# Patient Record
Sex: Male | Born: 1945
Health system: Southern US, Community
[De-identification: ages and names within clinical notes are randomized; demographics above are authoritative.]

## PROBLEM LIST (undated history)

## (undated) DIAGNOSIS — J069 Acute upper respiratory infection, unspecified: Principal | ICD-10-CM

## (undated) DIAGNOSIS — Z87891 Personal history of nicotine dependence: Secondary | ICD-10-CM

## (undated) DIAGNOSIS — J40 Bronchitis, not specified as acute or chronic: Principal | ICD-10-CM

## (undated) DIAGNOSIS — H409 Unspecified glaucoma: Secondary | ICD-10-CM

## (undated) DIAGNOSIS — H9190 Unspecified hearing loss, unspecified ear: Secondary | ICD-10-CM

## (undated) DIAGNOSIS — E785 Hyperlipidemia, unspecified: Secondary | ICD-10-CM

## (undated) DIAGNOSIS — R011 Cardiac murmur, unspecified: Secondary | ICD-10-CM

## (undated) DIAGNOSIS — M1990 Unspecified osteoarthritis, unspecified site: Secondary | ICD-10-CM

## (undated) DIAGNOSIS — D1809 Hemangioma of other sites: Secondary | ICD-10-CM

## (undated) DIAGNOSIS — A6920 Lyme disease, unspecified: Secondary | ICD-10-CM

## (undated) HISTORY — DX: Unspecified osteoarthritis, unspecified site: M19.90

## (undated) HISTORY — PX: JOINT REPLACEMENT: SHX530

## (undated) HISTORY — PX: MASTOID DEBRIDEMENT: SHX2002

## (undated) HISTORY — PX: HERNIA REPAIR: SHX51

## (undated) HISTORY — PX: CHOLECYSTECTOMY: SHX55

## (undated) HISTORY — DX: Lyme disease, unspecified: A69.20

## (undated) HISTORY — DX: Hyperlipidemia, unspecified: E78.5

---

## 2000-05-10 ENCOUNTER — Encounter: Payer: Self-pay | Admitting: Emergency Medicine

## 2000-05-10 ENCOUNTER — Inpatient Hospital Stay (HOSPITAL_COMMUNITY): Admission: EM | Admit: 2000-05-10 | Discharge: 2000-05-13 | Payer: Self-pay | Admitting: Emergency Medicine

## 2000-05-11 ENCOUNTER — Encounter: Payer: Self-pay | Admitting: Surgery

## 2003-06-24 HISTORY — PX: TENDON REPAIR: SHX5111

## 2008-06-23 LAB — HM COLONOSCOPY

## 2010-08-22 HISTORY — PX: CARDIOVASCULAR STRESS TEST: SHX262

## 2010-08-22 HISTORY — PX: US ECHOCARDIOGRAPHY: HXRAD669

## 2010-10-03 ENCOUNTER — Ambulatory Visit
Admission: RE | Admit: 2010-10-03 | Discharge: 2010-10-03 | Disposition: A | Discharge status: Home / Self Care | Payer: PRIVATE HEALTH INSURANCE | Source: Ambulatory Visit | Attending: Family Medicine | Admitting: Family Medicine

## 2010-10-03 ENCOUNTER — Other Ambulatory Visit: Payer: Self-pay | Admitting: Family Medicine

## 2010-10-03 DIAGNOSIS — R52 Pain, unspecified: Secondary | ICD-10-CM

## 2010-10-03 DIAGNOSIS — R609 Edema, unspecified: Secondary | ICD-10-CM

## 2010-10-03 DIAGNOSIS — M1990 Unspecified osteoarthritis, unspecified site: Secondary | ICD-10-CM

## 2010-10-03 HISTORY — DX: Unspecified osteoarthritis, unspecified site: M19.90

## 2010-12-12 ENCOUNTER — Encounter: Payer: Self-pay | Admitting: Family Medicine

## 2010-12-24 ENCOUNTER — Encounter: Payer: Self-pay | Admitting: Family Medicine

## 2011-05-01 ENCOUNTER — Other Ambulatory Visit: Payer: Self-pay | Admitting: Family Medicine

## 2011-05-01 DIAGNOSIS — R2230 Localized swelling, mass and lump, unspecified upper limb: Secondary | ICD-10-CM

## 2011-05-01 DIAGNOSIS — M259 Joint disorder, unspecified: Secondary | ICD-10-CM

## 2011-05-05 ENCOUNTER — Ambulatory Visit
Admission: RE | Admit: 2011-05-05 | Discharge: 2011-05-05 | Disposition: A | Discharge status: Home / Self Care | Payer: Managed Care, Other (non HMO) | Source: Ambulatory Visit | Attending: Family Medicine | Admitting: Family Medicine

## 2011-05-05 DIAGNOSIS — M259 Joint disorder, unspecified: Secondary | ICD-10-CM

## 2011-05-05 DIAGNOSIS — R2230 Localized swelling, mass and lump, unspecified upper limb: Secondary | ICD-10-CM

## 2011-05-05 MED ORDER — IOHEXOL 300 MG/ML  SOLN
100.0000 mL | Freq: Once | INTRAMUSCULAR | Status: AC | PRN
  Administered 2011-05-05: 100 mL via INTRAVENOUS

## 2011-05-16 ENCOUNTER — Encounter (HOSPITAL_COMMUNITY): Payer: Self-pay

## 2011-05-20 ENCOUNTER — Encounter (HOSPITAL_COMMUNITY)
Admission: RE | Admit: 2011-05-20 | Discharge: 2011-05-20 | Disposition: A | Discharge status: Home / Self Care | Payer: Managed Care, Other (non HMO) | Source: Ambulatory Visit | Attending: Orthopedic Surgery | Admitting: Orthopedic Surgery

## 2011-05-20 ENCOUNTER — Encounter (HOSPITAL_COMMUNITY): Payer: Self-pay

## 2011-05-20 HISTORY — DX: Hemangioma of other sites: D18.09

## 2011-05-20 HISTORY — DX: Unspecified hearing loss, unspecified ear: H91.90

## 2011-05-20 HISTORY — DX: Cardiac murmur, unspecified: R01.1

## 2011-05-20 LAB — COMPREHENSIVE METABOLIC PANEL
ALT: 20 U/L (ref 0–53)
AST: 20 U/L (ref 0–37)
Albumin: 3.8 g/dL (ref 3.5–5.2)
CO2: 28 mEq/L (ref 19–32)
Chloride: 104 mEq/L (ref 96–112)
Creatinine, Ser: 0.96 mg/dL (ref 0.50–1.35)
GFR calc non Af Amer: 86 mL/min — ABNORMAL LOW (ref >90)
Sodium: 140 mEq/L (ref 135–145)
Total Bilirubin: 1.6 mg/dL — ABNORMAL HIGH (ref 0.3–1.2)

## 2011-05-20 LAB — URINALYSIS, ROUTINE W REFLEX MICROSCOPIC
Glucose, UA: NEGATIVE mg/dL
Ketones, ur: NEGATIVE mg/dL
Leukocytes, UA: NEGATIVE
Nitrite: NEGATIVE
Specific Gravity, Urine: 1.012 (ref 1.005–1.030)
pH: 6.5 (ref 5.0–8.0)

## 2011-05-20 LAB — PROTIME-INR: INR: 0.97 (ref 0.00–1.49)

## 2011-05-20 LAB — CBC
Platelets: 223 10*3/uL (ref 150–400)
RBC: 4.95 MIL/uL (ref 4.22–5.81)
RDW: 12.5 % (ref 11.5–15.5)
WBC: 6.7 10*3/uL (ref 4.0–10.5)

## 2011-05-20 LAB — TYPE AND SCREEN
ABO/RH(D): A POS
Antibody Screen: NEGATIVE

## 2011-05-20 LAB — SURGICAL PCR SCREEN: Staphylococcus aureus: NEGATIVE

## 2011-05-20 LAB — ABO/RH: ABO/RH(D): A POS

## 2011-05-20 LAB — APTT: aPTT: 29 seconds (ref 24–37)

## 2011-05-20 NOTE — Progress Notes (Signed)
Requested and received EKG, Stress test, ECHO from [facility].

## 2011-05-20 NOTE — Pre-Procedure Instructions (Signed)
20 RIPLEY BOGOSIAN  05/20/2011   Your procedure is scheduled on:  December 5  Report to [facility] Stay [facility] at 6:30 AM.  Call this number if you have problems the morning of surgery: [phone]   Remember:   Do not eat food:After Midnight.  May have clear liquids: up to 4 Hours before arrival.  Clear liquids include soda, tea, black coffee, apple or grape juice, broth.  Take these medicines the morning of surgery with A SIP OF WATER: none   Do not wear jewelry, make-up or nail polish.  Do not wear lotions, powders, or perfumes. You may wear deodorant.  Do not shave 48 hours prior to surgery.  Do not bring valuables to the hospital.  Contacts, dentures or bridgework may not be worn into surgery.  Leave suitcase in the car. After surgery it may be brought to your room.  For patients admitted to the hospital, checkout time is 11:00 AM the day of discharge.   Patients discharged the day of surgery will not be allowed to drive home.  Name and phone number of your driver: NA  Special Instructions: Incentive Spirometry - Practice and bring it with you on the day of surgery. and CHG Shower Use Special Wash: 1/2 bottle night before surgery and 1/2 bottle morning of surgery.   Please read over the following fact sheets that you were given: Pain Booklet, Coughing and Deep Breathing, Blood Transfusion Information, Total Joint Packet and Surgical Site Infection Prevention

## 2011-05-27 MED ORDER — CEFAZOLIN SODIUM-DEXTROSE 2-3 GM-% IV SOLR
2.0000 g | INTRAVENOUS | Status: AC
  Administered 2011-05-28: 2 g via INTRAVENOUS
  Filled 2011-05-27 (×2): qty 50

## 2011-05-27 NOTE — H&P (Addendum)
  [facility]el/[facility] [address]   [address] [place], [place] [address] [phone] A Division of [facility]  Loreta Ave, M.D.     Robert A. Thurston Hole, M.D.     Lunette Stands, M.D. Eulas Post, M.D.    Buford Dresser, M.D. Estell Harpin, M.D. Ralene Cork, D.O.          Genene Churn. Barry Dienes, PA-C            Kirstin A. Shepperson, PA-C Janace Litten, OPA-C   RE: Theodore Davis, Theodore Davis                                [phone]      DOB: 1946-01-04 PROGRESS NOTE: 05-20-11 Chief complaint: Right knee pain.  History of present illness: 34 three year-old white male with a history of end stage DJD, right knee, and chronic pain.  Returns.  States that knee symptoms are unchanged from previous visit.  He is wanting to proceed with total knee replacement as scheduled next week.   Current medications: Zocor, Magnesium and Boswellia extract. Allergies: Codeine. Past medical/surgical history: Cholecystectomy, familial lipomatosis disorder and hypercholesterolemia. Review of systems: All other systems are unremarkable.  Family history: Positive for heart disease, kidney disease and cancer.  Social history: Quit smoking several years ago, denies alcohol use.  Patient is married.      EXAMINATION: Height: 5?9.  Weight: 204 pounds.  Blood pressure: 139/88.  Pulse: 60.  Temperature: 97.8.  Pleasant white male, alert and oriented x 3 and in no acute distress.  Gait is antalgic.  Head is normocephalic, a traumatic.  PERRLA, EOMI.  Oral mucosa pink and moist.  Cervical spine unremarkable.  Lungs: CTA bilaterally.  No wheezes.  Heart: RRR.  S1 and S2.  Abdomen: Round and non-distended.  NBS x 4.  Soft and non-tender.  Right knee: Decreased range of motion.  Positive crepitus.  Joint line tender.  1-2+ effusion.  Ligaments stable.  Calf non-tender.  Neurovascularly intact.  Skin warm and dry.    IMPRESSION: End stage DJD, right knee, and chronic  pain.  Failed conservative treatment.  PLAN:  We will proceed with right total knee replacement as scheduled next week.  Surgical procedure, along with potential rehab/recovery time discussed with patient and his wife, who is present.  All questions answered.    Loreta Ave, M.D.   Electronically verified by Loreta Ave, M.D. DFM(JMO):jjh D 05-20-11 T 05-22-11  The patient has been re-examined, and the chart reviewed, and there have been no interval changes to the documented history and physical.    The risks, benefits, and alternatives have been discussed at length, and the patient is willing to proceed.

## 2011-05-28 ENCOUNTER — Encounter (HOSPITAL_COMMUNITY): Payer: Self-pay | Admitting: Anesthesiology

## 2011-05-28 ENCOUNTER — Inpatient Hospital Stay (HOSPITAL_COMMUNITY)
Admission: RE | Admit: 2011-05-28 | Discharge: 2011-05-30 | DRG: 470 | Disposition: A | Discharge status: Home / Self Care | Payer: Managed Care, Other (non HMO) | Source: Ambulatory Visit | Attending: Orthopedic Surgery | Admitting: Orthopedic Surgery

## 2011-05-28 ENCOUNTER — Inpatient Hospital Stay (HOSPITAL_COMMUNITY): Payer: Managed Care, Other (non HMO) | Admitting: Anesthesiology

## 2011-05-28 ENCOUNTER — Inpatient Hospital Stay (HOSPITAL_COMMUNITY): Payer: Managed Care, Other (non HMO)

## 2011-05-28 ENCOUNTER — Encounter (HOSPITAL_COMMUNITY): Payer: Self-pay | Admitting: *Deleted

## 2011-05-28 ENCOUNTER — Encounter (HOSPITAL_COMMUNITY)
Admission: RE | Disposition: A | Discharge status: Home / Self Care | Payer: Self-pay | Source: Ambulatory Visit | Attending: Orthopedic Surgery

## 2011-05-28 DIAGNOSIS — R011 Cardiac murmur, unspecified: Secondary | ICD-10-CM | POA: Diagnosis present

## 2011-05-28 DIAGNOSIS — E7800 Pure hypercholesterolemia, unspecified: Secondary | ICD-10-CM | POA: Diagnosis present

## 2011-05-28 DIAGNOSIS — E7889 Other lipoprotein metabolism disorders: Secondary | ICD-10-CM | POA: Diagnosis present

## 2011-05-28 DIAGNOSIS — G8929 Other chronic pain: Secondary | ICD-10-CM | POA: Diagnosis present

## 2011-05-28 DIAGNOSIS — Z4789 Encounter for other orthopedic aftercare: Secondary | ICD-10-CM

## 2011-05-28 DIAGNOSIS — M1710 Unilateral primary osteoarthritis, unspecified knee: Principal | ICD-10-CM | POA: Diagnosis present

## 2011-05-28 HISTORY — PX: TOTAL KNEE ARTHROPLASTY: SHX125

## 2011-05-28 SURGERY — ARTHROPLASTY, KNEE, TOTAL
Anesthesia: Regional | Site: Knee | Laterality: Right | Wound class: Clean

## 2011-05-28 MED ORDER — BUPIVACAINE-EPINEPHRINE PF 0.5-1:200000 % IJ SOLN
INTRAMUSCULAR | Status: DC | PRN
  Administered 2011-05-28: 30 mL

## 2011-05-28 MED ORDER — ONDANSETRON HCL 4 MG/2ML IJ SOLN: 4.0000 mg | Freq: Four times a day (QID) | INTRAMUSCULAR | Status: DC | PRN

## 2011-05-28 MED ORDER — MORPHINE SULFATE (PF) 1 MG/ML IV SOLN
INTRAVENOUS | Status: DC
  Administered 2011-05-28: 15:00:00 via INTRAVENOUS
  Administered 2011-05-29: 5 mg via INTRAVENOUS
  Administered 2011-05-29: 7 mg via INTRAVENOUS
  Administered 2011-05-29: 4 mg via INTRAVENOUS
  Filled 2011-05-28: qty 25

## 2011-05-28 MED ORDER — DIPHENHYDRAMINE HCL 12.5 MG/5ML PO ELIX
12.5000 mg | ORAL_SOLUTION | Freq: Four times a day (QID) | ORAL | Status: DC | PRN
  Filled 2011-05-28: qty 5

## 2011-05-28 MED ORDER — HYDROMORPHONE HCL PF 1 MG/ML IJ SOLN: 0.2500 mg | INTRAMUSCULAR | Status: DC | PRN

## 2011-05-28 MED ORDER — LACTATED RINGERS IV SOLN
INTRAVENOUS | Status: DC | PRN
  Administered 2011-05-28 (×2): via INTRAVENOUS

## 2011-05-28 MED ORDER — FENTANYL CITRATE 0.05 MG/ML IJ SOLN
INTRAMUSCULAR | Status: DC | PRN
  Administered 2011-05-28 (×2): 25 ug via INTRAVENOUS
  Administered 2011-05-28: 100 ug via INTRAVENOUS

## 2011-05-28 MED ORDER — SODIUM CHLORIDE 0.9 % IR SOLN
Status: DC | PRN
  Administered 2011-05-28: 1000 mL

## 2011-05-28 MED ORDER — ONDANSETRON HCL 4 MG/2ML IJ SOLN
INTRAMUSCULAR | Status: DC | PRN
  Administered 2011-05-28: 4 mg via INTRAVENOUS

## 2011-05-28 MED ORDER — WARFARIN VIDEO
Freq: Once | Status: AC
  Administered 2011-05-28: 14:00:00

## 2011-05-28 MED ORDER — SIMVASTATIN 20 MG PO TABS
20.0000 mg | ORAL_TABLET | Freq: Every day | ORAL | Status: DC
  Administered 2011-05-28 – 2011-05-29 (×2): 20 mg via ORAL
  Filled 2011-05-28 (×3): qty 1

## 2011-05-28 MED ORDER — METHOCARBAMOL 100 MG/ML IJ SOLN: 500.0000 mg | Freq: Four times a day (QID) | INTRAVENOUS | Status: DC | PRN

## 2011-05-28 MED ORDER — MIDAZOLAM HCL 5 MG/5ML IJ SOLN
INTRAMUSCULAR | Status: DC | PRN
  Administered 2011-05-28: 2 mg via INTRAVENOUS

## 2011-05-28 MED ORDER — ENOXAPARIN SODIUM 30 MG/0.3ML ~~LOC~~ SOLN
30.0000 mg | Freq: Two times a day (BID) | SUBCUTANEOUS | Status: DC
  Administered 2011-05-28 – 2011-05-30 (×4): 30 mg via SUBCUTANEOUS
  Filled 2011-05-28 (×5): qty 0.3

## 2011-05-28 MED ORDER — ACETAMINOPHEN 650 MG RE SUPP: 650.0000 mg | Freq: Four times a day (QID) | RECTAL | Status: DC | PRN

## 2011-05-28 MED ORDER — PHENOL 1.4 % MT LIQD: 1.0000 | OROMUCOSAL | Status: DC | PRN

## 2011-05-28 MED ORDER — METHOCARBAMOL 500 MG PO TABS
500.0000 mg | ORAL_TABLET | Freq: Four times a day (QID) | ORAL | Status: DC | PRN
  Administered 2011-05-29 – 2011-05-30 (×2): 500 mg via ORAL
  Filled 2011-05-28 (×2): qty 1

## 2011-05-28 MED ORDER — OXYCODONE-ACETAMINOPHEN 5-325 MG PO TABS
1.0000 | ORAL_TABLET | ORAL | Status: DC | PRN
  Administered 2011-05-29 (×2): 1 via ORAL
  Administered 2011-05-30 (×3): 2 via ORAL
  Filled 2011-05-28 (×2): qty 1
  Filled 2011-05-28 (×3): qty 2

## 2011-05-28 MED ORDER — ONDANSETRON HCL 4 MG PO TABS: 4.0000 mg | ORAL_TABLET | Freq: Four times a day (QID) | ORAL | Status: DC | PRN

## 2011-05-28 MED ORDER — METHOCARBAMOL 100 MG/ML IJ SOLN
500.0000 mg | Freq: Once | INTRAVENOUS | Status: DC
  Filled 2011-05-28: qty 5

## 2011-05-28 MED ORDER — COUMADIN BOOK
Freq: Once | Status: AC
  Administered 2011-05-28: 14:00:00
  Filled 2011-05-28: qty 1

## 2011-05-28 MED ORDER — DIPHENHYDRAMINE HCL 50 MG/ML IJ SOLN: 12.5000 mg | Freq: Four times a day (QID) | INTRAMUSCULAR | Status: DC | PRN

## 2011-05-28 MED ORDER — WARFARIN SODIUM 7.5 MG PO TABS
7.5000 mg | ORAL_TABLET | Freq: Once | ORAL | Status: AC
  Administered 2011-05-28: 7.5 mg via ORAL
  Filled 2011-05-28: qty 1

## 2011-05-28 MED ORDER — NALOXONE HCL 0.4 MG/ML IJ SOLN: 0.4000 mg | INTRAMUSCULAR | Status: DC | PRN

## 2011-05-28 MED ORDER — ACETAMINOPHEN 325 MG PO TABS: 650.0000 mg | ORAL_TABLET | Freq: Four times a day (QID) | ORAL | Status: DC | PRN

## 2011-05-28 MED ORDER — CEFAZOLIN SODIUM 1-5 GM-% IV SOLN
1.0000 g | Freq: Four times a day (QID) | INTRAVENOUS | Status: AC
  Administered 2011-05-28 – 2011-05-29 (×3): 1 g via INTRAVENOUS
  Filled 2011-05-28 (×3): qty 50

## 2011-05-28 MED ORDER — MORPHINE SULFATE 4 MG/ML IJ SOLN
INTRAMUSCULAR | Status: DC | PRN
  Administered 2011-05-28: 4 mg via INTRAVENOUS

## 2011-05-28 MED ORDER — POTASSIUM CHLORIDE IN NACL 20-0.45 MEQ/L-% IV SOLN
INTRAVENOUS | Status: DC
  Administered 2011-05-28 – 2011-05-29 (×3): via INTRAVENOUS
  Filled 2011-05-28 (×7): qty 1000

## 2011-05-28 MED ORDER — MENTHOL 3 MG MT LOZG: 1.0000 | LOZENGE | OROMUCOSAL | Status: DC | PRN

## 2011-05-28 MED ORDER — PROPOFOL 10 MG/ML IV EMUL
INTRAVENOUS | Status: DC | PRN
  Administered 2011-05-28: 200 mg via INTRAVENOUS

## 2011-05-28 MED ORDER — CHLORHEXIDINE GLUCONATE 4 % EX LIQD: 60.0000 mL | Freq: Once | CUTANEOUS | Status: DC

## 2011-05-28 MED ORDER — SODIUM CHLORIDE 0.9 % IJ SOLN: 9.0000 mL | INTRAMUSCULAR | Status: DC | PRN

## 2011-05-28 MED ORDER — BUPIVACAINE HCL (PF) 0.25 % IJ SOLN
INTRAMUSCULAR | Status: DC | PRN
  Administered 2011-05-28: 30 mL

## 2011-05-28 SURGICAL SUPPLY — 55 items
BANDAGE ESMARK 6X9 LF (GAUZE/BANDAGES/DRESSINGS) ×1 IMPLANT
BLADE SAG 18X100X1.27 (BLADE) ×4 IMPLANT
BNDG CMPR 9X6 STRL LF SNTH (GAUZE/BANDAGES/DRESSINGS) ×1
BNDG ESMARK 6X9 LF (GAUZE/BANDAGES/DRESSINGS) ×2
BOOTCOVER CLEANROOM LRG (PROTECTIVE WEAR) ×4 IMPLANT
BOWL SMART MIX CTS (DISPOSABLE) ×2 IMPLANT
CEMENT BONE SIMPLEX SPEEDSET (Cement) ×3 IMPLANT
CLOTH BEACON ORANGE TIMEOUT ST (SAFETY) ×2 IMPLANT
COVER BACK TABLE 24X17X13 BIG (DRAPES) ×2 IMPLANT
COVER SURGICAL LIGHT HANDLE (MISCELLANEOUS) ×2 IMPLANT
CUFF TOURNIQUET SINGLE 34IN LL (TOURNIQUET CUFF) ×2 IMPLANT
DRAPE EXTREMITY T 121X128X90 (DRAPE) ×2 IMPLANT
DRAPE PROXIMA HALF (DRAPES) ×2 IMPLANT
DRAPE U-SHAPE 47X51 STRL (DRAPES) ×2 IMPLANT
DRSG PAD ABDOMINAL 8X10 ST (GAUZE/BANDAGES/DRESSINGS) ×2 IMPLANT
DURAPREP 26ML APPLICATOR (WOUND CARE) ×2 IMPLANT
ELECT CAUTERY BLADE 6.4 (BLADE) ×2 IMPLANT
ELECT REM PT RETURN 9FT ADLT (ELECTROSURGICAL) ×2
ELECTRODE REM PT RTRN 9FT ADLT (ELECTROSURGICAL) ×1 IMPLANT
EVACUATOR 1/8 PVC DRAIN (DRAIN) ×2 IMPLANT
FACESHIELD LNG OPTICON STERILE (SAFETY) ×2 IMPLANT
GAUZE XEROFORM 5X9 LF (GAUZE/BANDAGES/DRESSINGS) ×2 IMPLANT
GLOVE BIOGEL PI IND STRL 8 (GLOVE) ×1 IMPLANT
GLOVE BIOGEL PI INDICATOR 8 (GLOVE) ×1
GOWN PREVENTION PLUS XLARGE (GOWN DISPOSABLE) ×4 IMPLANT
GOWN STRL NON-REIN LRG LVL3 (GOWN DISPOSABLE) ×3 IMPLANT
GOWN STRL REIN 2XL LVL4 (GOWN DISPOSABLE) ×2 IMPLANT
HANDPIECE INTERPULSE COAX TIP (DISPOSABLE) ×2
IMMOBILIZER KNEE 22 UNIV (SOFTGOODS) ×2 IMPLANT
IMMOBILIZER KNEE 24 THIGH 36 (MISCELLANEOUS) IMPLANT
IMMOBILIZER KNEE 24 UNIV (MISCELLANEOUS)
KIT BASIN OR (CUSTOM PROCEDURE TRAY) ×2 IMPLANT
KIT ROOM TURNOVER OR (KITS) ×2 IMPLANT
MANIFOLD NEPTUNE II (INSTRUMENTS) ×2 IMPLANT
NS IRRIG 1000ML POUR BTL (IV SOLUTION) ×2 IMPLANT
PACK TOTAL JOINT (CUSTOM PROCEDURE TRAY) ×2 IMPLANT
PAD ARMBOARD 7.5X6 YLW CONV (MISCELLANEOUS) ×4 IMPLANT
PAD CAST 4YDX4 CTTN HI CHSV (CAST SUPPLIES) ×1 IMPLANT
PADDING CAST COTTON 4X4 STRL (CAST SUPPLIES) ×2
PADDING CAST COTTON 6X4 STRL (CAST SUPPLIES) ×2 IMPLANT
RUBBERBAND STERILE (MISCELLANEOUS) ×2 IMPLANT
SET HNDPC FAN SPRY TIP SCT (DISPOSABLE) ×1 IMPLANT
SPONGE GAUZE 4X4 12PLY (GAUZE/BANDAGES/DRESSINGS) ×2 IMPLANT
STAPLER VISISTAT 35W (STAPLE) ×2 IMPLANT
SUCTION FRAZIER TIP 10 FR DISP (SUCTIONS) ×2 IMPLANT
SUT VIC AB 1 CTX 36 (SUTURE) ×4
SUT VIC AB 1 CTX36XBRD ANBCTR (SUTURE) ×2 IMPLANT
SUT VIC AB 2-0 CT1 27 (SUTURE) ×4
SUT VIC AB 2-0 CT1 TAPERPNT 27 (SUTURE) ×2 IMPLANT
SYR 30ML LL (SYRINGE) ×2 IMPLANT
SYR 30ML SLIP (SYRINGE) ×2 IMPLANT
TOWEL OR 17X24 6PK STRL BLUE (TOWEL DISPOSABLE) ×2 IMPLANT
TOWEL OR 17X26 10 PK STRL BLUE (TOWEL DISPOSABLE) ×2 IMPLANT
TRAY FOLEY CATH 14FR (SET/KITS/TRAYS/PACK) ×2 IMPLANT
WATER STERILE IRR 1000ML POUR (IV SOLUTION) ×6 IMPLANT

## 2011-05-28 NOTE — Transfer of Care (Signed)
Immediate Anesthesia Transfer of Care Note  Patient: Theodore Davis  Procedure(s) Performed:  TOTAL KNEE ARTHROPLASTY - Right total knee arthroplasty   Patient Location: PACU  Anesthesia Type: General  Level of Consciousness: sedated and patient cooperative  Airway & Oxygen Therapy: Patient Spontanous Breathing and Patient connected to nasal cannula oxygen  Post-op Assessment: Report given to PACU RN and Post -op Vital signs reviewed and stable  Post vital signs: Reviewed and stable  Complications: No apparent anesthesia complications

## 2011-05-28 NOTE — Brief Op Note (Signed)
05/28/2011  10:30 AM  PATIENT:  Theodore Davis  65 y.o. male  PRE-OPERATIVE DIAGNOSIS:  Degenerative joint disease of right knee   POST-OPERATIVE DIAGNOSIS:  Degenerative joint disease of right knee   PROCEDURE:  Procedure(s): Right TOTAL KNEE ARTHROPLASTY  SURGEON:  Surgeon(s): Loreta Ave, MD  PHYSICIAN ASSISTANT: Anielle Headrick M   ASSISTANTS: none   ANESTHESIA: general  EBL:  Total I/O In: 1250 [I.V.:1250] Out: 230 [Urine:200; Blood:30]    DRAINS: hemovac    SPECIMEN:  No Specimen  DISPOSITION OF SPECIMEN:  N/A  COUNTS:  YES  TOURNIQUET:   Total Tourniquet Time Documented: Thigh (Right) - 74 minutes     PATIENT DISPOSITION:  PACU - hemodynamically stable.   Delay start of Pharmacological VTE agent (>24hrs) due to surgical blood loss or risk of bleeding:  {YES/NO/NOT APPLICABLE:20182

## 2011-05-28 NOTE — Progress Notes (Signed)
ANTICOAGULATION CONSULT NOTE - Initial Consult  Pharmacy Consult for Coumadin Indication: VTE prophylaxis  Allergies  Allergen Reactions  . Codeine Other (See Comments)    Unknown.  . Lipitor (Atorvastatin Calcium) Other (See Comments)    myalgias  . Sudafed (Pseudoephedrine Hcl) Other (See Comments)    unknown    Patient Measurements:   Adjusted Body Weight:    Vital Signs: Temp: 97.6 F (36.4 C) (12/05 1345) Temp src: Oral (12/05 0634) BP: 102/64 mmHg (12/05 1345) Pulse Rate: 61  (12/05 1345)  Labs: No results found for this basename: HGB:2,HCT:3,PLT:3,APTT:3,LABPROT:3,INR:3,HEPARINUNFRC:3,CREATININE:3,CKTOTAL:3,CKMB:3,TROPONINI:3 in the last 72 hours CrCl is unknown because there is no height on file for the current visit.  Medical History: Past Medical History  Diagnosis Date  . Lyme disease   . Arthritis 10/03/2010    right sternoclavicular joint  . Hyperlipidemia     hypercholesterolemia  . Heart murmur   . HOH (hard of hearing)     R ear  . Hemangioma of spine     lower lumbar    Medications:  Prescriptions prior to admission  Medication Sig Dispense Refill  . simvastatin (ZOCOR) 20 MG tablet Take 20 mg by mouth at bedtime.        Marland Kitchen DISCONTD: Boswellia Serrata (BOSWELLIA PO) Take 1 tablet by mouth 2 (two) times daily with a meal.        . DISCONTD: fish oil-omega-3 fatty acids 1000 MG capsule Take 1 g by mouth daily.       Marland Kitchen DISCONTD: MAGNESIUM PO Take 1 tablet by mouth 2 (two) times daily with a meal.          Assessment: S/p R TKA to start LMWH/Coumadin for VTE prophylaxis. Baseline INR 0.97 with Tbili elevated at 1.6. Baseline labs done 11/27.  Goal of Therapy:  INR 2-3   Plan:  Lovenox 30mg /12h Coumadin 7.5mg  x 1 today. Initiate education with Coumadin book and video.  Merilynn [place], [employer] 05/28/2011,1:55 PM

## 2011-05-28 NOTE — Anesthesia Postprocedure Evaluation (Signed)
Anesthesia Post Note  Patient: Theodore Davis  Procedure(s) Performed:  TOTAL KNEE ARTHROPLASTY - Right total knee arthroplasty   Anesthesia type: General  Patient location: PACU  Post pain: Pain level controlled and Adequate analgesia  Post assessment: Post-op Vital signs reviewed, Patient's Cardiovascular Status Stable, Respiratory Function Stable, Patent Airway and Pain level controlled  Last Vitals:  Filed Vitals:   05/28/11 1030  BP:   Pulse:   Temp: 36.6 C  Resp:     Post vital signs: Reviewed and stable  Level of consciousness: awake, alert  and oriented  Complications: No apparent anesthesia complications

## 2011-05-28 NOTE — Anesthesia Procedure Notes (Addendum)
Anesthesia Regional Block:  Femoral nerve block  Pre-Anesthetic Checklist: ,, timeout performed, Correct Patient, Correct Site, Correct Laterality, Correct Procedure,, site marked, risks and benefits discussed, Surgical consent,  Pre-op evaluation,  At surgeon's request and post-op pain management  Laterality: Right  Prep: chloraprep       Needles:  Injection technique: Single-shot  Needle Type: Echogenic Stimulator Needle     Needle Length: 9cm  Needle Gauge: 21    Additional Needles:  Procedures: nerve stimulator Femoral nerve block  Nerve Stimulator or Paresthesia:  Response: Quadriceps muscle contraction, 0.45 mA,   Additional Responses:   Narrative:  Start time: 05/28/2011 8:20 AM End time: 05/28/2011 8:30 AM Injection made incrementally with aspirations every 5 mL.  Performed by: Personally  Anesthesiologist: Dr Chaney Malling  Additional Notes: Functioning IV was confirmed and monitors were applied.  A 90mm 21ga Arrow echogenic stimulator needle was used. Sterile prep and drape,hand hygiene and sterile gloves were used.  Negative aspiration and negative test dose prior to incremental administration of local anesthetic. The patient tolerated the procedure well.    Femoral nerve block Procedure Name: LMA Insertion Date/Time: 05/28/2011 8:50 AM Performed by: Gwenyth Allegra Pre-anesthesia Checklist: Patient identified, Emergency Drugs available, Timeout performed, Suction available and Patient being monitored Patient Re-evaluated:Patient Re-evaluated prior to inductionOxygen Delivery Method: Circle System Utilized Preoxygenation: Pre-oxygenation with 100% oxygen Intubation Type: IV induction LMA: LMA inserted LMA Size: 5.0 Number of attempts: 1 Placement Confirmation: positive ETCO2,  CO2 detector and breath sounds checked- equal and bilateral Tube secured with: Tape Dental Injury: Teeth and Oropharynx as per pre-operative assessment        Narrative:         Narrative:       Narrative:

## 2011-05-28 NOTE — Plan of Care (Signed)
Problem: Consults Goal: Diagnosis- Total Joint Replacement Primary Total Knee     

## 2011-05-28 NOTE — Anesthesia Preprocedure Evaluation (Addendum)
Anesthesia Evaluation  Patient identified by MRN, date of birth, ID band Patient awake    Reviewed: Allergy & Precautions, H&P , NPO status , Patient's Chart, lab work & pertinent test results  Airway Mallampati: II  Neck ROM: full    Dental   Pulmonary          Cardiovascular + Valvular Problems/Murmurs     Neuro/Psych    GI/Hepatic   Endo/Other    Renal/GU      Musculoskeletal   Abdominal   Peds  Hematology   Anesthesia Other Findings   Reproductive/Obstetrics                           Anesthesia Physical Anesthesia Plan  ASA: II  Anesthesia Plan: General and Regional   Post-op Pain Management:    Induction: Intravenous  Airway Management Planned: LMA  Additional Equipment:   Intra-op Plan:   Post-operative Plan: Extubation in OR  Informed Consent:   Plan Discussed with: CRNA and Surgeon  Anesthesia Plan Comments:         Anesthesia Quick Evaluation

## 2011-05-28 NOTE — Preoperative (Signed)
Beta Blockers   Reason not to administer Beta Blockers:Not Applicable 

## 2011-05-29 ENCOUNTER — Encounter (HOSPITAL_COMMUNITY): Payer: Self-pay | Admitting: Orthopedic Surgery

## 2011-05-29 LAB — BASIC METABOLIC PANEL
CO2: 28 mEq/L (ref 19–32)
Calcium: 7.9 mg/dL — ABNORMAL LOW (ref 8.4–10.5)
Creatinine, Ser: 0.97 mg/dL (ref 0.50–1.35)
Glucose, Bld: 120 mg/dL — ABNORMAL HIGH (ref 70–99)

## 2011-05-29 LAB — CBC
HCT: 35.2 % — ABNORMAL LOW (ref 39.0–52.0)
Hemoglobin: 12.1 g/dL — ABNORMAL LOW (ref 13.0–17.0)
MCH: 30 pg (ref 26.0–34.0)
MCV: 87.1 fL (ref 78.0–100.0)
RBC: 4.04 MIL/uL — ABNORMAL LOW (ref 4.22–5.81)

## 2011-05-29 MED ORDER — WARFARIN SODIUM 7.5 MG PO TABS
7.5000 mg | ORAL_TABLET | Freq: Once | ORAL | Status: AC
  Administered 2011-05-29: 7.5 mg via ORAL
  Filled 2011-05-29: qty 1

## 2011-05-29 NOTE — Progress Notes (Signed)
ANTICOAGULATION CONSULT NOTE - Follow Up Consult  Pharmacy Consult for Coumadin Indication: VTE prophylaxis  Allergies  Allergen Reactions  . Codeine Other (See Comments)    Unknown.  . Lipitor (Atorvastatin Calcium) Other (See Comments)    myalgias  . Sudafed (Pseudoephedrine Hcl) Other (See Comments)    unknown      Vital Signs: Temp: 99.3 F (37.4 C) (12/06 0607) BP: 109/58 mmHg (12/06 0607) Pulse Rate: 77  (12/06 0607)  Labs:  Basename 05/29/11 0640  HGB 12.1*  HCT 35.2*  PLT 175  APTT --  LABPROT 16.0*  INR 1.25  HEPARINUNFRC --  CREATININE 0.97  CKTOTAL --  CKMB --  TROPONINI --   CrCl is unknown because there is no height on file for the current visit.   Medications:  Scheduled:    . ceFAZolin (ANCEF) IV  1 g Intravenous Q6H  . coumadin book   Does not apply Once  . enoxaparin  30 mg Subcutaneous Q12H  . methocarbamol(ROBAXIN) IV  500 mg Intravenous Once  . simvastatin  20 mg Oral QHS  . warfarin  7.5 mg Oral ONCE-1800  . warfarin   Does not apply Once  . DISCONTD: chlorhexidine  60 mL Topical Once  . DISCONTD: morphine   Intravenous Q4H    Assessment: 65 y/o male patient s/p TKR receiving coumadin for DVT px. INR subtherapeutic, on lovenox 30mg  q12h. No bleeding reported.  Goal of Therapy:  INR 2-3   Plan:  Repeat coumadin 7.5mg  today and f/u in am.  Verlene Mayer, PharmD, BCPS 12/6/201211:32 AM

## 2011-05-29 NOTE — Plan of Care (Signed)
Problem: Discharge Progression Outcomes Goal: Activity appropriate for discharge plan Outcome: Progressing Pt tolerated UB/LB dressing, sink level grooming , transfer to restroom and then became light headed returning to chair. Once in chair light headed feeling resolved.

## 2011-05-29 NOTE — Op Note (Signed)
NAMEMarland Kitchen  VICKEY, EWBANK NO.:  [id number]  MEDICAL RECORD NO.:  [id number]  LOCATION:  5018                         FACILITY:  [facility]  PHYSICIAN:  Loreta Ave, M.D. DATE OF BIRTH:  [birth year]  DATE OF PROCEDURE:  05/28/2011 DATE OF DISCHARGE:                              OPERATIVE REPORT   PREOPERATIVE DIAGNOSIS:  Right knee end-stage degenerative arthritis. Varus alignment.  POSTOPERATIVE DIAGNOSIS:  Right knee end-stage degenerative arthritis. Varus alignment.  PROCEDURE:  Modified minimally invasive right total knee replacement, Stryker Triathlon prosthesis.  Cemented peg posterior stabilized #6 femoral component.  Cemented # 6 tibial component, 9-mm polyethylene insert.  Cemented resurfacing 38-mm patellar component.  Soft tissue balancing.  SURGEON:  Loreta Ave, MD  ASSISTANT:  Genene Churn. Barry Dienes, [place], present throughout the entire case, necessary for timely completion of procedure.  ANESTHESIA:  General.  BLOOD LOSS:  Minimal.  SPECIMENS:  None.  CULTURES:  None.  COMPLICATIONS:  None.  DRESSING:  Soft compressive knee immobilizer.  DRAINS:  Hemovac x1.  TOURNIQUET TIME:  1 hour and 10 minutes.  PROCEDURE:  The patient was brought to the operating room and placed on the operating table in supine position.  After adequate anesthesia had been obtained, right knee examined.  Varus alignment, fairly good extension and flexion.  Stable ligaments.  Varus partially correctable. Tourniquet applied, prepped and draped in usual sterile fashion. Exsanguinated with elevation of Esmarch.  Tourniquet inflated to 350 mmHg.  Straight incision above the patella down to the tibial tubercle. Skin and subcutaneous tissue divided.  Hemostasis with cautery.  Medial arthrotomy, vastus splitting, preserving quad tendon.  Knee exposed. Remnants of menisci, cruciate ligaments, loose bodies, periarticular spurs removed.  Distal femur exposed.  Intramedullary  guide placed.  A 10-mm resection set at 5 degrees of valgus.  Using epicondylar axis, sized, cut, and fitted for a pegged cemented posterior stabilized #6 component.  Attention turned to the tibia.  A 3-degree posterior slope cut.  Size #6 component as well.  Debris cleared throughout all recesses.  Patella exposed, posterior 10 mm removed.  Drilled, sized, and fitted for a 38-mm component.  Medial capsule release had already been done.  Trials put in place.  Tibia was marked for rotation with trials.  With the 9-mm insert and the #6 components above and below, full extension, full flexion, good alignment, good stability, good mechanical axis.  Excellent patellofemoral tracking as well.  All trials removed.  Copious irrigation with a pulse irrigating device.  Cement prepared, placed on all components, firmly cemented.  Patellar component held with clamp.  After the cement hardened, knee was reexamined with the same findings as above.  Hemovac placed and brought out through a separate stab wound.  Arthrotomy closed with #1 Vicryl.  Skin and subcutaneous tissue with Vicryl and staples.  Sterile compressive dressing applied.  Tourniquet deflated and removed.  Knee immobilizer applied.  Anesthesia reversed.  Brought to recovery room.  Tolerated the surgery well.  No complications.     Loreta Ave, M.D.     DFM/MEDQ  D:  05/28/2011  T:  05/29/2011  Job:  [id number]

## 2011-05-29 NOTE — Progress Notes (Signed)
Occupational Therapy Evaluation Patient Details Name: Theodore Davis MRN: [id number] DOB: [birth year] Today's Date: 05/29/2011  Problem List: There is no problem list on file for this patient.   Past Medical History:  Past Medical History  Diagnosis Date  . Lyme disease   . Arthritis 10/03/2010    right sternoclavicular joint  . Hyperlipidemia     hypercholesterolemia  . Heart murmur   . HOH (hard of hearing)     R ear  . Hemangioma of spine     lower lumbar   Past Surgical History:  Past Surgical History  Procedure Date  . Cholecystectomy   . US echocardiography 08/2010  . Cardiovascular stress test 08/2010  . Tendon repair 2005    R elbow  . Mastoid debridement     R ear  . Hernia repair     R inguinal  . Total knee arthroplasty 05/28/2011    Procedure: TOTAL KNEE ARTHROPLASTY;  Surgeon: Loreta Ave, MD;  Location: [facility] OR;  Service: Orthopedics;  Laterality: Right;  Right total knee arthroplasty     OT Assessment/Plan/Recommendation OT Assessment Clinical Impression Statement: 65 yo male s/p Rt TKA with decr mobility, decr activity tolerance that could benefit from acute OT to maximize independence to decr burden of care at d/c home OT Recommendation/Assessment: Patient will need skilled OT in the acute care venue OT Problem List: Decreased activity tolerance;Impaired balance (sitting and/or standing);Decreased safety awareness;Decreased knowledge of use of DME or AE;Pain OT Therapy Diagnosis : Generalized weakness;Acute pain OT Plan OT Frequency: Min 2X/week OT Recommendation Follow Up Recommendations: Home health OT Equipment Recommended: Defer to next venue Individuals Consulted Consulted and Agree with Results and Recommendations: Patient OT Goals Acute Rehab OT Goals OT Goal Formulation: With patient Time For Goal Achievement: 2 weeks ADL Goals Pt Will Transfer to Toilet: with modified independence;Regular height toilet;Ambulation Pt Will Perform  Toileting - Clothing Manipulation: with modified independence;Sitting on 3-in-1 or toilet Pt Will Perform Toileting - Hygiene: with modified independence;Sit to stand from 3-in-1/toilet Pt Will Perform Tub/Shower Transfer: with supervision;Ambulation Additional ADL Goal #1: pt will perform bed mobility hob flat no rails MOD I as precursor to ADLS.  OT Evaluation Precautions/Restrictions  Precautions Precautions: Knee Required Braces or Orthoses: Yes Knee Immobilizer: On at all times;On except when in CPM Restrictions Weight Bearing Restrictions: Yes RLE Weight Bearing: Weight bearing as tolerated Prior Functioning Home Living Lives With: Spouse Receives Help From: Family Type of Home: House Home Layout: Two level Alternate Level Stairs-Rails: Right Alternate Level Stairs-Number of Steps: 13 Home Access: Stairs to enter Entrance Stairs-Rails: None Entrance Stairs-Number of Steps: 1 Bathroom Shower/Tub: Engineer, manufacturing systems: Standard Bathroom Accessibility: Yes How Accessible: Accessible via walker Home Adaptive Equipment: Walker - rolling;Bedside commode/3-in-1 Prior Function Level of Independence: Independent with basic ADLs;Independent with homemaking with ambulation;Independent with gait;Independent with transfers Able to Take Stairs?: Yes Driving: Yes Vocation: Retired Leisure: Hobbies-yes (Comment) Comments: loves to fish for crappy ADL ADL Eating/Feeding: Performed;Set up Where Assessed - Eating/Feeding: Chair Grooming: Performed;Shaving;Teeth care;Set up Where Assessed - Grooming: Sitting at sink (pt began light headed and return to sitting) Upper Body Dressing: Performed;Set up Where Assessed - Upper Body Dressing: Sitting, chair;Supported Lower Body Dressing: Performed;Moderate assistance Where Assessed - Lower Body Dressing: Standing;Sit to stand from chair Toilet Transfer: Performed;Set up Toilet Transfer Method: Stand pivot Toilet Transfer  Equipment: Regular height toilet (standing with urinal to void bladder) Toileting - Clothing Manipulation: Performed;Supervision/safety Where Assessed - Toileting Clothing Manipulation: Standing  Toileting - Hygiene: Performed;Supervision/safety Where Assessed - Toileting Hygiene: Standing Equipment Used: Reacher ADL Comments: Pt don clothing in chair, pants placed  in KI, stood at sink for grooming, void bladder 50 cc in restroom standing, shaving face at sink. pt returned to sitting because pt felt light headed. pt resolved and no light headed feeling in chair. (pt unable to reach feet and plans to have wife A.) Vision/Perception  Vision - History Baseline Vision: Bifocals Patient Visual Report: No change from baseline Cognition Cognition Arousal/Alertness: Awake/alert Overall Cognitive Status: Appears within functional limits for tasks assessed Orientation Level: Oriented X4 Sensation/Coordination Sensation Light Touch: Appears Intact Light Touch Impaired Details: Impaired RLE (decreased sensation below knee; possible, due to nerve block) Coordination Gross Motor Movements are Fluid and Coordinated: Yes Fine Motor Movements are Fluid and Coordinated: Yes Extremity Assessment RUE Assessment RUE Assessment: Within Functional Limits LUE Assessment LUE Assessment: Within Functional Limits Mobility  Bed Mobility Bed Mobility: No Supine to Sit: 4: Min assist;HOB elevated (Comment degrees) (30) Supine to Sit Details (indicate cue type and reason): VC for sequencing and hand placement on rails. Assist of RLE into sitting Sitting - Scoot to Edge of Bed: 4: Min assist Sitting - Scoot to [employer] of Bed Details (indicate cue type and reason): VC for hand placement Transfers Transfers: Yes Sit to Stand: 4: Min assist;With upper extremity assist;From chair/3-in-1 Sit to Stand Details (indicate cue type and reason): v/c for safety and hand placement Stand to Sit: 4: Min assist;With upper  extremity assist;To chair/3-in-1 Stand to Sit Details: VC for hand placement Exercises Total Joint Exercises Ankle Circles/Pumps: AROM;Strengthening;10 reps;Supine;Both Quad Sets: AROM;Strengthening;Right;10 reps;Supine End of Session OT - End of Session Equipment Utilized During Treatment: Gait belt Activity Tolerance: Patient tolerated treatment well Patient left: in chair;with call bell in reach Nurse Communication: Mobility status for transfers General Behavior During Session: [facility] for tasks performed Cognition: [facility] for tasks performed  Next session: transfer to toilet and tub transfer   Lucile Shutters 05/29/2011, 12:14 PM  Pager: [phone]

## 2011-05-29 NOTE — Progress Notes (Signed)
Subjective: Doing well.  Pain controlled. Objective: Vital signs in last 24 hours: Temp:  [97 F (36.1 C)-100.3 F (37.9 C)] 99.3 F (37.4 C) (12/06 0607) Pulse Rate:  [57-77] 77  (12/06 0607) Resp:  [15-25] 18  (12/06 0842) BP: (102-125)/(58-67) 109/58 mmHg (12/06 0607) SpO2:  [94 %-100 %] 94 % (12/06 0842)  Intake/Output from previous day: 12/05 0701 - 12/06 0700 In: 2567.5 [P.O.:240; I.V.:2227.5; IV Piggyback:100] Out: 2080 [Urine:1500; Drains:550; Blood:30] Intake/Output this shift:     [facility] 05/29/11 0640  HGB 12.1*    Basename 05/29/11 0640  WBC 8.5  RBC 4.04*  HCT 35.2*  PLT 175    Basename 05/29/11 0640  NA 136  K 3.9  CL 103  CO2 28  BUN 12  CREATININE 0.97  GLUCOSE 120*  CALCIUM 7.9*    Basename 05/29/11 0640  LABPT --  INR 1.25    Neurovascular intact Compartment soft Dressing clean and dry. Assessment/Plan: Anticipate d/c home tomorrow.  D/c foley and pca.     Kare Dado M 05/29/2011, 8:47 AM

## 2011-05-29 NOTE — Progress Notes (Signed)
Physical Therapy Evaluation Patient Details Name: Theodore Davis MRN: [id number] DOB: 11-May-1946 Today's Date: 05/29/2011  Problem List: There is no problem list on file for this patient.   Past Medical History:  Past Medical History  Diagnosis Date  . Lyme disease   . Arthritis 10/03/2010    right sternoclavicular joint  . Hyperlipidemia     hypercholesterolemia  . Heart murmur   . HOH (hard of hearing)     R ear  . Hemangioma of spine     lower lumbar   Past Surgical History:  Past Surgical History  Procedure Date  . Cholecystectomy   . US echocardiography 08/2010  . Cardiovascular stress test 08/2010  . Tendon repair 2005    R elbow  . Mastoid debridement     R ear  . Hernia repair     R inguinal    PT Assessment/Plan/Recommendation PT Assessment Clinical Impression Statement: Pt presents with a medical diagnosis of Right TKA along with the following impairments/deficits listed below. Pt will benefit from skilled PT in the acute care setting in order to maximize functional mobility for a safe d/c home. PT Recommendation/Assessment: Patient will need skilled PT in the acute care venue PT Problem List: Decreased strength;Decreased range of motion;Decreased activity tolerance;Decreased balance;Decreased mobility;Decreased knowledge of use of DME;Decreased knowledge of precautions;Pain PT Therapy Diagnosis : Difficulty walking;Acute pain PT Plan PT Frequency: 7X/week PT Treatment/Interventions: Gait training;DME instruction;Stair training;Functional mobility training;Therapeutic activities;Therapeutic exercise;Patient/family education PT Recommendation Follow Up Recommendations: Home health PT Equipment Recommended: None recommended by PT PT Goals  Acute Rehab PT Goals PT Goal Formulation: With patient Time For Goal Achievement: 7 days Pt will go Supine/Side to Sit: with modified independence PT Goal: Supine/Side to Sit - Progress: Progressing toward goal Pt will go  Sit to Supine/Side: with modified independence PT Goal: Sit to Supine/Side - Progress: Progressing toward goal Pt will go Sit to Stand: with modified independence PT Goal: Sit to Stand - Progress: Progressing toward goal Pt will go Stand to Sit: with modified independence PT Goal: Stand to Sit - Progress: Progressing toward goal Pt will Transfer Bed to Chair/Chair to Bed: with modified independence PT Transfer Goal: Bed to Chair/Chair to Bed - Progress: Progressing toward goal Pt will Ambulate: >150 feet;with modified independence;with rolling walker PT Goal: Ambulate - Progress: Progressing toward goal Pt will Go Up / Down Stairs: 1-2 stairs;Flight;with rolling walker;with rail(s);with min assist (1 step with RW; flight with rail) PT Goal: Up/Down Stairs - Progress: Other (comment) (unassessed today) Pt will Perform Home Exercise Program: Independently PT Goal: Perform Home Exercise Program - Progress: Progressing toward goal  PT Evaluation Precautions/Restrictions  Restrictions Weight Bearing Restrictions: Yes RLE Weight Bearing: Weight bearing as tolerated Prior Functioning  Home Living Lives With: Spouse Receives Help From: Family Type of Home: House Home Layout: Two level Alternate Level Stairs-Rails: Right Alternate Level Stairs-Number of Steps: 13 Home Access: Stairs to enter Entrance Stairs-Rails: None Entrance Stairs-Number of Steps: 1 Bathroom Shower/Tub: Engineer, manufacturing systems: Standard Bathroom Accessibility: Yes How Accessible: Accessible via walker Home Adaptive Equipment: Walker - rolling;Bedside commode/3-in-1 Prior Function Level of Independence: Independent with basic ADLs;Independent with homemaking with ambulation;Independent with gait;Independent with transfers Able to Take Stairs?: Yes Driving: Yes Vocation: Retired Financial risk analyst Arousal/Alertness: Awake/alert Overall Cognitive Status: Appears within functional limits for tasks  assessed Orientation Level: Oriented X4 Sensation/Coordination Sensation Light Touch: Impaired Detail Light Touch Impaired Details: Impaired RLE (decreased sensation below knee; possible, due to nerve block) Extremity  Assessment RLE Assessment RLE Assessment: Exceptions to [facility] RLE AROM (degrees) Overall AROM Right Lower Extremity: Deficits;Due to pain (Hip and Ankle WFL) RLE Strength RLE Overall Strength: Deficits;Due to pain (Hip an Ankle WFL; Pt with good quad contraction ) LLE Assessment LLE Assessment: Within Functional Limits Mobility (including Balance) Bed Mobility Bed Mobility: Yes Supine to Sit: 4: Min assist;HOB elevated (Comment degrees) (30) Supine to Sit Details (indicate cue type and reason): VC for sequencing and hand placement on rails. Assist of RLE into sitting Sitting - Scoot to Edge of Bed: 4: Min assist Sitting - Scoot to [place] of Bed Details (indicate cue type and reason): VC for hand placement Transfers Transfers: Yes Sit to Stand: 3: Mod assist;With upper extremity assist;From bed Sit to Stand Details (indicate cue type and reason): VC for hand placement for safety to RW. Assist for steadying upon standing Stand to Sit: 4: Min assist;To chair/3-in-1;With upper extremity assist Stand to Sit Details: VC for hand placement Ambulation/Gait Ambulation/Gait: Yes Ambulation/Gait Assistance: 4: Min assist Ambulation/Gait Assistance Details (indicate cue type and reason): VC for sequencing and postural cues throughout session. Pt felt lightheaded towards the end of ambulation, therefore distance was limited Ambulation Distance (Feet): 40 Feet Assistive device: Rolling walker Gait Pattern: Step-to pattern;Decreased hip/knee flexion - right;Decreased dorsiflexion - right;Decreased step length - left;Decreased stance time - right;Trunk flexed Gait velocity: Decreased gait speed Stairs: No    Exercise  Total Joint Exercises Ankle Circles/Pumps: AROM;Strengthening;10  reps;Supine;Both Quad Sets: AROM;Strengthening;Right;10 reps;Supine End of Session PT - End of Session Equipment Utilized During Treatment: Gait belt;Right knee immobilizer Activity Tolerance: Patient limited by fatigue Patient left: in chair;with call bell in reach Nurse Communication: Mobility status for transfers;Mobility status for ambulation General Behavior During Session: [facility] for tasks performed Cognition: [facility] for tasks performed  Milana Kidney 05/29/2011, 10:18 AM  05/29/2011 Milana Kidney DPT PAGER: [phone] OFFICE: [phone]

## 2011-05-30 LAB — PROTIME-INR
INR: 1.68 — ABNORMAL HIGH (ref 0.00–1.49)
Prothrombin Time: 20.1 seconds — ABNORMAL HIGH (ref 11.6–15.2)

## 2011-05-30 LAB — CBC
MCV: 87.4 fL (ref 78.0–100.0)
Platelets: 147 10*3/uL — ABNORMAL LOW (ref 150–400)
RBC: 3.82 MIL/uL — ABNORMAL LOW (ref 4.22–5.81)
RDW: 12.4 % (ref 11.5–15.5)
WBC: 9.5 10*3/uL (ref 4.0–10.5)

## 2011-05-30 MED ORDER — WARFARIN SODIUM 5 MG PO TABS
5.0000 mg | ORAL_TABLET | Freq: Once | ORAL | Status: DC
  Filled 2011-05-30: qty 1

## 2011-05-30 NOTE — Progress Notes (Signed)
Occupational Therapy Treatment Patient Details Name: Theodore Davis MRN: [id number] DOB: May 09, 1946 Today's Date: 05/30/2011  OT Assessment/Plan OT Assessment/Plan Comments on Treatment Session: Pt found ambulating to doorway of room pt stops states "Am I okay to be walking?" pt with ice pack inside KI and KI low on leg. Pt provided a chair and returned to sitting. Ot total A to adjust the KI and remove the ice pack. Pt then ambulated the ~150 ft around nurses station. pt with excellent gait sequence with RW. Pt completed bathroom transfer MOD I. Pt educated during session twice to extend Rt LE out with stand<>sit OT Plan: Discharge plan remains appropriate OT Frequency: Min 2X/week Follow Up Recommendations: Home health OT Equipment Recommended: None recommended by OT OT Goals Acute Rehab OT Goals OT Goal Formulation: With patient Time For Goal Achievement: 2 weeks ADL Goals Pt Will Transfer to Toilet: with modified independence;Regular height toilet;Ambulation ADL Goal: Toilet Transfer - Progress: Met Pt Will Perform Toileting - Clothing Manipulation: with modified independence;Sitting on 3-in-1 or toilet ADL Goal: Toileting - Clothing Manipulation - Progress: Met Pt Will Perform Toileting - Hygiene: with modified independence;Sit to stand from 3-in-1/toilet ADL Goal: Toileting - Hygiene - Progress: Met Pt Will Perform Tub/Shower Transfer: with supervision;Ambulation ADL Goal: Tub/Shower Transfer - Progress: Not addressed Additional ADL Goal #1: pt will perform bed mobility hob flat no rails MOD I as precursor to ADLS. Miscellaneous OT Goals OT Goal: Miscellaneous Goal #1 - Progress: Not addressed  OT Treatment Precautions/Restrictions  Precautions Precautions: Knee Required Braces or Orthoses: Yes Knee Immobilizer: On at all times;On except when in CPM Restrictions Weight Bearing Restrictions: Yes RLE Weight Bearing: Weight bearing as tolerated   ADL ADL Toilet Transfer:  Performed;Modified independent Toilet Transfer Method: Stand pivot;Ambulating Toilet Transfer Equipment: Regular height toilet Toileting - Clothing Manipulation: Performed;Modified independent Where Assessed - Toileting Clothing Manipulation: Sit to stand from 3-in-1 or toilet Toileting - Hygiene: Performed;Modified independent Where Assessed - Toileting Hygiene: Sit to stand from 3-in-1 or toilet Equipment Used: Rolling walker Mobility  Bed Mobility Bed Mobility: No Exercises    End of Session OT - End of Session Activity Tolerance: Patient tolerated treatment well Patient left:  (in restroom) Nurse Communication: Mobility status for transfers General Behavior During Session: [name] for tasks performed Cognition: [name] for tasks performed  Lucile Shutters  05/30/2011, 3:24 PM Pager: [phone]

## 2011-05-30 NOTE — Progress Notes (Signed)
CARE MANAGEMENT NOTE 05/30/2011 discharge planning. spoke with patient. choice offered, patient states he was preoperatively setup with [facility], no changes.DME has been delivered to pts home.

## 2011-05-30 NOTE — Progress Notes (Signed)
Subjective: Doing great.  Ready to go home.  Objective: Vital signs in last 24 hours: Temp:  [98.9 F (37.2 C)-99.2 F (37.3 C)] 98.9 F (37.2 C) (12/07 0616) Pulse Rate:  [67-86] 78  (12/07 0616) Resp:  [18] 18  (12/07 0616) BP: (116-123)/(56-77) 116/66 mmHg (12/07 0616) SpO2:  [96 %-98 %] 96 % (12/07 0616)  Intake/Output from previous day: 12/06 0701 - 12/07 0700 In: 240 [P.O.:240] Out: 550 [Urine:550] Intake/Output this shift:     Basename 05/30/11 0555 05/29/11 0640  HGB 11.5* 12.1*    Basename 05/30/11 0555 05/29/11 0640  WBC 9.5 8.5  RBC 3.82* 4.04*  HCT 33.4* 35.2*  PLT 147* 175    Basename 05/29/11 0640  NA 136  K 3.9  CL 103  CO2 28  BUN 12  CREATININE 0.97  GLUCOSE 120*  CALCIUM 7.9*    Basename 05/30/11 0555 05/29/11 0640  LABPT -- --  INR 1.68* 1.25    Neurovascular intact Compartment soft Wound looks good. Staples intact.  No signs of infection.  Drain removed.  Assessment/Plan: Discharge home today. F/u as scheduled.   Graceanna Theissen M 05/30/2011, 8:52 AM

## 2011-05-30 NOTE — Progress Notes (Signed)
ANTICOAGULATION CONSULT NOTE - Follow Up Consult  Pharmacy Consult for Coumadin  Indication: VTE prophylaxis  Allergies  Allergen Reactions  . Codeine Other (See Comments)    Unknown.  . Lipitor (Atorvastatin Calcium) Other (See Comments)    myalgias  . Sudafed (Pseudoephedrine Hcl) Other (See Comments)    unknown    Patient Measurements:    Vital Signs: Temp: 98.9 F (37.2 C) (12/07 0616) BP: 116/66 mmHg (12/07 0616) Pulse Rate: 78  (12/07 0616)  Labs:  Basename 05/30/11 0555 05/29/11 0640  HGB 11.5* 12.1*  HCT 33.4* 35.2*  PLT 147* 175  APTT -- --  LABPROT 20.1* 16.0*  INR 1.68* 1.25  HEPARINUNFRC -- --  CREATININE -- 0.97  CKTOTAL -- --  CKMB -- --  TROPONINI -- --   CrCl is unknown because there is no height on file for the current visit.   Medications:  Scheduled:    . enoxaparin  30 mg Subcutaneous Q12H  . methocarbamol(ROBAXIN) IV  500 mg Intravenous Once  . simvastatin  20 mg Oral QHS  . warfarin  7.5 mg Oral ONCE-1800    Assessment: 64 y/o male patient receiving coumadin for dvt px. INR subtherapeutic, large increase today s/p 2 doses of 7.5mg . Will decrease dose today, no bleeding reported.   Goal of Therapy:  INR 2-3   Plan:  Coumadin 5mg  today, estimate this to be daily dose.  Verlene Mayer, PharmD, BCPS Pager [phone] 05/30/2011,10:35 AM

## 2011-05-30 NOTE — Progress Notes (Signed)
Physical Therapy Treatment Patient Details Name: Theodore Davis MRN: [id number] DOB: 07-May-1946 Today's Date: 05/30/2011  PT Assessment/Plan  PT - Assessment/Plan Comments on Treatment Session: Pt progressing well. Pt was able to ambulate an increased distance today, although at an extremely slow pace. He was also able to complete stairs with minimal cueing for safety. Pt is safe to d/c home with 24/7 supervision for safety. PT Plan: Discharge plan remains appropriate;Frequency remains appropriate PT Frequency: 7X/week Follow Up Recommendations: Home health PT Equipment Recommended: None recommended by PT PT Goals  Acute Rehab PT Goals PT Goal Formulation: With patient Time For Goal Achievement: 7 days PT Goal: Supine/Side to Sit - Progress: Progressing toward goal PT Goal: Sit to Supine/Side - Progress: Progressing toward goal PT Goal: Sit to Stand - Progress: Progressing toward goal PT Goal: Stand to Sit - Progress: Progressing toward goal PT Transfer Goal: Bed to Chair/Chair to Bed - Progress: Progressing toward goal PT Goal: Ambulate - Progress: Progressing toward goal PT Goal: Up/Down Stairs - Progress: Met PT Goal: Perform Home Exercise Program - Progress: Progressing toward goal  PT Treatment Precautions/Restrictions  Precautions Precautions: Knee Required Braces or Orthoses: Yes Knee Immobilizer: On at all times;On except when in CPM Restrictions Weight Bearing Restrictions: Yes RLE Weight Bearing: Weight bearing as tolerated Mobility (including Balance) Bed Mobility Bed Mobility: Yes Supine to Sit: 4: Min assist;HOB flat;With rails Supine to Sit Details (indicate cue type and reason): VC for sequencing as well as assist with RLE. Pt able to complete all trunk control without physical assist Sitting - Scoot to Edge of Bed: 6: Modified independent (Device/Increase time) Transfers Transfers: Yes Sit to Stand: 5: Supervision;With upper extremity assist;From bed Sit to  Stand Details (indicate cue type and reason): VC for hand placement and safety to RW Stand to Sit: 5: Supervision;With upper extremity assist;To chair/3-in-1 Stand to Sit Details: Controlled descent. VC for hand placement Ambulation/Gait Ambulation/Gait: Yes Ambulation/Gait Assistance: 5: Supervision Ambulation/Gait Assistance Details (indicate cue type and reason): VC for postural cues and safety with RW. Pt extends RW far in front of him. Ambulation Distance (Feet): 200 Feet Assistive device: Rolling walker Gait Pattern: Step-to pattern;Decreased hip/knee flexion - right;Decreased dorsiflexion - right;Decreased step length - left;Decreased stance time - right;Trunk flexed Gait velocity: Decreased gait speed Stairs: Yes Stairs Assistance: 4: Min assist Stairs Assistance Details (indicate cue type and reason): VC for proper gait sequencing and safety with RW Stair Management Technique: No rails;With walker;Backwards;Step to pattern Number of Stairs: 2     Exercise  Total Joint Exercises Heel Slides: AROM;Strengthening;Right;10 reps;Seated Hip ABduction/ADduction: AROM;Strengthening;Right;10 reps;Supine Straight Leg Raises: AAROM;Strengthening;Right;10 reps;Supine Long Arc Quad: AAROM;Right;10 reps;Strengthening;Seated End of Session PT - End of Session Equipment Utilized During Treatment: Gait belt;Right knee immobilizer Activity Tolerance: Patient tolerated treatment well Patient left: in chair;with call bell in reach Nurse Communication: Mobility status for transfers;Mobility status for ambulation General Behavior During Session: [facility] for tasks performed Cognition: [facility] for tasks performed  Milana Kidney 05/30/2011, 10:07 AM  05/30/2011 Milana Kidney DPT PAGER: [phone] OFFICE: [phone]

## 2011-07-06 NOTE — Discharge Summary (Signed)
   ABBREVIATED DISCHARGE SUMMARY  DATE OF HOSPITALIZATION: 28 May 2011      REASON FOR HOSPITALIZATION:  65 YO WM W/ HX R KNEE DJD AND CHRONIC PAIN.  FAILED CONSERVATIVE TREATMENT.      SIGNIFICANT FINDINGS:  DJD OPERATION:  RIGHT TKR  FINAL DIAGNOSIS:  SAME   SECONDARY DIAGNOSIS:  NONE   CONSULTANTS:  NONE  DISCHARGE CONDITION:  STABLE  DISCHARGED TO:  HOME

## 2011-12-03 ENCOUNTER — Inpatient Hospital Stay
Admit: 2011-12-03 | Discharge: 2011-12-03 | Disposition: A | Discharge status: Home / Self Care | Attending: Emergency Medicine

## 2011-12-03 LAB — CBC
Hematocrit: 43.2 % (ref 37.0–54.0)
Hemoglobin: 14.6 g/dL (ref 12.5–16.5)
MCH: 29.9 pg (ref 26.0–35.0)
MCHC: 33.8 % (ref 32.0–34.5)
MCV: 88.3 fL (ref 80.0–99.9)
MPV: 7.2 fL (ref 7.0–12.0)
Platelets: 162 E9/L (ref 130–450)
RBC: 4.89 E12/L (ref 3.80–5.80)
RDW: 12.8 fL (ref 11.5–15.0)
WBC: 7 E9/L (ref 4.5–11.5)

## 2011-12-03 LAB — GFR CALCULATED: Gfr Calculated: >60 mL/min/{1.73_m2} (ref >=60)

## 2011-12-03 LAB — BASIC METABOLIC PANEL
BUN: 15 mg/dL (ref 9–23)
CO2: 22 mmol/L (ref 20–31)
Calcium: 9.4 mg/dL (ref 8.6–10.5)
Chloride: 98 mmol/L — ABNORMAL LOW (ref 99–109)
Creatinine: 0.9 mg/dL (ref 0.7–1.3)
Glucose: 103 mg/dL (ref 70–110)
Potassium: 4.5 mmol/L (ref 3.5–5.5)
Sodium: 129 mmol/L — ABNORMAL LOW (ref 132–146)

## 2011-12-03 NOTE — ED Provider Notes (Signed)
HPI:  12/03/2011,   Time: 4:33 PM         Troy Mcdaniel is a 66 y.o. male presenting to the ED for fatigue chills fever  bodyache and difficulty sleeping, beginning one week ago when he suddenly stopped drinking coffee upon the recommendation of his ENT physician.  The complaint has been persistent, moderate in severity, and worsened by nothing.  Patient states he drank up to 12 cups of coffee per day, and then stopped drinking coffee altogether upon his doctor's recommendation.    ROS:   Unless otherwise stated in this report or unable to obtain because of the patient's clinical or mental status as evidenced by the medical record, this patients's positive and negative responses for Review of Systems, constitutional, psych, eyes, ENT, cardiovascular, respiratory, gastrointestinal, neurological, genitourinary, musculoskeletal, integument systems and systems related to the presenting problem are either stated in the preceding or were not pertinent or were negative for the symptoms and/or complaints related to the medical problem.      --------------------------------------------- PAST HISTORY ---------------------------------------------  Past Medical History:  has a past medical history of Cancer.    Past Surgical History:  has past surgical history that includes Prostatectomy.    Social History:  reports that he has never smoked. He does not have any smokeless tobacco history on file. He reports that  drinks alcohol.    Family History: family history is not on file.     The patient's home medications have been reviewed.    Allergies: Review of patient's allergies indicates no known allergies.    -------------------------------------------------- RESULTS -------------------------------------------------  All laboratory and radiology results have been personally reviewed by myself   LABS:  Results for orders placed during the hospital encounter of 12/03/11   CBC       Result Value Range    WBC 7.0  4.5 - 11.5 E9/L     RBC 4.89  3.80 - 5.80 E12/L    Hemoglobin 14.6  12.5 - 16.5 g/dL    Hematocrit 21.3  08.6 - 54.0 %    MCV 88.3  80.0 - 99.9 fL    MCH 29.9  26.0 - 35.0 pg    MCHC 33.8  32.0 - 34.5 %    RDW 12.8  11.5 - 15.0 fL    Platelets 162  130 - 450 E9/L    MPV 7.2  7.0 - 12.0 fL   BASIC METABOLIC PANEL       Result Value Range    Sodium 129 (*) 132 - 146 mmol/L    Potassium 4.5  3.5 - 5.5 mmol/L    Chloride 98 (*) 99 - 109 mmol/L    CO2 22  20 - 31 mmol/L    Glucose 103  70 - 110 mg/dL    BUN 15  9 - 23 mg/dL    Creatinine, Ser 0.9  0.7 - 1.3 mg/dL    Calcium 9.4  8.6 - 57.8 mg/dL   GFR CALCULATED       Result Value Range    Gfr Calculated >60  >=60 ml/mn/1.73       RADIOLOGY:  Interpreted by Radiologist.       ------------------------- NURSING NOTES AND VITALS REVIEWED ---------------------------   The nursing notes within the ED encounter and vital signs as below have been reviewed.   BP 119/74  Pulse 95  Temp(Src) 100.2 F (37.9 C) (Oral)  Resp 20  Wt 210 lb (95.255 kg)  Oxygen Saturation Interpretation: Normal      ---------------------------------------------------  PHYSICAL EXAM--------------------------------------      Constitutional/General: Alert and oriented x3, well appearing, non toxic in NAD  Head: NC/AT  Eyes: PERRL, EOMI  Mouth: Oropharynx clear, handling secretions, no trismus  Neck: Supple, full ROM, no meningeal signs  Pulmonary: Lungs clear to auscultation bilaterally, no wheezes, rales, or rhonchi. Not in respiratory distress  Cardiovascular:  Regular rate and rhythm, no murmurs, gallops, or rubs. 2+ distal pulses  Abdomen: Soft, non tender, non distended,   Extremities: Moves all extremities x 4. Warm and well perfused  Skin: warm and dry without rash  Neurologic: GCS 15,  Psych: Normal Affect      ------------------------------ ED COURSE/MEDICAL DECISION MAKING----------------------  Medications - No data to display      Medical Decision Making:    Patient advised to cut back on his caffeine  intake more gradually to allow time for his body to accommodate.    Counseling:   The emergency provider has spoken with the patient and discussed today's results, in addition to providing specific details for the plan of care and counseling regarding the diagnosis and prognosis.  Questions are answered at this time and they are agreeable with the plan.      --------------------------------- IMPRESSION AND DISPOSITION ---------------------------------    IMPRESSION  1. Fatigue        DISPOSITION  Disposition: Discharge to home  Patient condition is good                  Cathleen Fears, MD  12/03/11 1635

## 2011-12-03 NOTE — Discharge Instructions (Signed)

## 2011-12-03 NOTE — ED Notes (Addendum)
Patient states prior to Saturday he drank 12 cups of coffee a day. He states he was instructed by his Ear doctor encouraged him to cut back because he has tinnitus. He states he stopped cold [place] and he began to feel sick. He states on Monday he had one cup of coffee and it helped him and today he had two. He states he is exhausted.    Taquanna Borras, LPN  14/78/29 [id number]    Daijon Wenke, LPN  30/86/57 [id number]

## 2011-12-05 ENCOUNTER — Inpatient Hospital Stay
Admit: 2011-12-05 | Discharge: 2011-12-05 | Disposition: A | Discharge status: Home / Self Care | Attending: Emergency Medicine

## 2011-12-05 MED ORDER — OMEPRAZOLE 20 MG PO CPDR
20 MG | ORAL_CAPSULE | Freq: Every day | ORAL | Status: DC
Start: 2011-12-05 — End: 2018-07-02

## 2011-12-05 MED ORDER — ONDANSETRON 4 MG PO TBDP
4 MG | ORAL_TABLET | Freq: Three times a day (TID) | ORAL | Status: AC | PRN
Start: 2011-12-05 — End: 2012-12-04

## 2011-12-05 MED ADMIN — ondansetron (ZOFRAN-ODT) disintegrating tablet 8 mg: ORAL | @ 18:00:00 | NDC 62756024064

## 2011-12-05 MED FILL — ONDANSETRON 4 MG PO TBDP: 4 MG | ORAL | Qty: 2

## 2011-12-05 NOTE — ED Provider Notes (Signed)
Patient is a 66 y.o. male presenting with vomiting. The history is provided by the patient.   Emesis   This is a new problem. The current episode started 2 days ago. The problem has not changed since onset.There has been no fever. Associated symptoms include abdominal pain, chills, diarrhea, headaches and sweats. Pertinent negatives include no arthralgias, no cough, no fever, no myalgias and no URI.     Patient presents with a number of complaints that started 6 days ago. Patient states he was having tinnitus and saw his ENT who recommended a slow his caffeine consumption. He states he was a 12 cup of coffee a day drinker for the last 20+ years. On Saturday he decided to stop his coffee drinking all at once. He states since that time he has had headaches, sweats, nausea without vomiting, and diarrhea. He was seen at this urgent care 2 days ago and was told to add back caffeine. He started drinking 10 cups of coffee for the past 2 days but now is having significant heartburn and nausea. He denies any vomiting. He tried taking pounds and Alka-Seltzer without relief.    Review of Systems   Constitutional: Positive for chills and diaphoresis. Negative for fever.   HENT: Negative for ear pain, sore throat and sinus pressure.    Eyes: Negative for pain, discharge and redness.   Respiratory: Negative for cough, shortness of breath and wheezing.    Cardiovascular: Negative for chest pain.   Gastrointestinal: Positive for nausea, abdominal pain and diarrhea. Negative for vomiting.   Genitourinary: Negative for dysuria and frequency.   Musculoskeletal: Negative for myalgias, back pain and arthralgias.   Skin: Negative for rash and wound.   Neurological: Positive for headaches. Negative for weakness.   Hematological: Negative for adenopathy.   All other systems reviewed and are negative.        Physical Exam   Nursing note and vitals reviewed.  Constitutional: He is oriented to person, place, and time. He appears  well-developed and well-nourished.   HENT:   Head: Normocephalic and atraumatic.   Mouth/Throat: Oropharynx is clear and moist.   Eyes: Pupils are equal, round, and reactive to light.   Neck: Normal range of motion. Neck supple.   Cardiovascular: Normal rate, regular rhythm and normal heart sounds.    No murmur heard.  Pulmonary/Chest: Effort normal and breath sounds normal. No respiratory distress. He has no wheezes. He has no rales.   Abdominal: Soft. Bowel sounds are normal. There is no tenderness. There is no rebound and no guarding.   Musculoskeletal: He exhibits no edema.   Neurological: He is alert and oriented to person, place, and time. No cranial nerve deficit. Coordination normal.   Skin: Skin is warm and dry. No rash noted. No erythema. No pallor.   Psychiatric: He has a normal mood and affect. His behavior is normal. Judgment and thought content normal.       Procedures    MDM    --------------------------------------------- PAST HISTORY ---------------------------------------------  Past Medical History:  has a past medical history of Cancer.    Past Surgical History:  has past surgical history that includes Prostatectomy.    Social History:  reports that he has quit smoking. He does not have any smokeless tobacco history on file. He reports that  drinks alcohol.    Family History: family history is not on file.     The patient's home medications have been reviewed.    Allergies: Review of patient's  allergies indicates no known allergies.    -------------------------------------------------- RESULTS -------------------------------------------------  Labs Reviewed - No data to display     No results found.    ------------------------- NURSING NOTES AND VITALS REVIEWED ---------------------------   The nursing notes within the ED encounter and vital signs as below have been reviewed.   BP 125/68  Pulse 96  Temp(Src) 99.8 F (37.7 C) (Oral)  Resp 17  SpO2 98%  Oxygen Saturation Interpretation:  Normal      ------------------------------------------ PROGRESS NOTES ------------------------------------------  Time: 1410  Re-evaluation.  Patient's symptoms are improving, patient had significant relief of symptoms after Zofran 8mg  ODT.  Repeat physical examination is improved, appears more comfortable.          I have spoken with the patient and discussed today's results, in addition to providing specific details for the plan of care and counseling regarding the diagnosis and prognosis.  Their questions are answered at this time and they are agreeable with the plan.  Patient will continue 10 cups of coffee daily and slowly decrease. His tinnitus is resolved currently. He will followup with his PCP next week.      --------------------------------- ADDITIONAL PROVIDER NOTES ---------------------------------     DIAGNOSIS:    1. Caffeine dependence    2. Acute gastritis             This patient is stable for discharge.  I have shared the specific conditions for return, as well as the importance of follow-up.          Eleanora Neighbor, DO  Resident  12/05/11 [phone]

## 2011-12-05 NOTE — Discharge Instructions (Signed)
Gastritis  Gastritis is an inflammation (the body's way of reacting to injury and/or infection) of the stomach. It is often caused by viral or bacterial (germ) infections. It can also be caused by chemicals (including alcohol) and medications. This illness may be associated with generalized malaise (feeling tired, not well), cramps, and fever. The illness may last 2 to 7 days. If symptoms of gastritis continue, gastroscopy (looking into the stomach with a telescope-like instrument), biopsy (taking tissue samples), and/or blood tests may be necessary to determine the cause. Antibiotics will not affect the illness unless there is a bacterial infection present. One common bacterial cause of gastritis is an organism known as H. Pylori. This can be treated with antibiotics. Other forms of gastritis are caused by too much acid in the stomach. They can be treated with medications such as H2 blockers and antacids. Home treatment is usually all that is needed. Young children will quickly become dehydrated (loss of body fluids) if vomiting and diarrhea are both present. Medications may be given to control nausea. Medications are usually not given for diarrhea unless especially bothersome. Some medications slow the removal of the virus from the gastrointestinal tract. This slows down the healing process.  HOME CARE INSTRUCTIONS  Home care instructions for nausea and vomiting:   For adults: drink small amounts of fluids often. Drink at least 2 quarts a day. Take sips frequently. Do not drink large amounts of fluid at one time. This may worsen the nausea.   Only take over-the-counter or prescription medicines for pain, discomfort, or fever as directed by your caregiver.   Drink clear liquids only. Those are anything you can see through such as water, broth, or soft drinks.   Once you are keeping clear liquids down, you may start full liquids, soups, juices, and ice cream or sherbet. Slowly add bland (plain, not spicy) foods  to your diet.  Home care instructions for diarrhea:   Diarrhea can be caused by bacterial infections or a virus. Your condition should improve with time, rest, fluids, and/or anti-diarrheal medication.   Until your diarrhea is under control, you should drink clear liquids often in small amounts. Clear liquids include: water, broth, jell-o water and weak tea.  Avoid:   Milk.   Fruits.   Tobacco.   Alcohol.   Extremely hot or cold fluids.   Too much intake of anything at one time.  When your diarrhea stops you may add the following foods, which help the stool to become more formed:   Rice.   Bananas.   Apples without skin.   Dry toast.  Once these foods are tolerated you may add low-fat yogurt and low-fat cottage cheese. They will help to restore the normal bacterial balance in your bowel.  Wash your hands well to avoid spreading bacteria (germ) or virus.  SEEK IMMEDIATE MEDICAL CARE IF:    You are unable to keep fluids down.   Vomiting or diarrhea become persistent (constant).   Abdominal pain develops, increases, or localizes. (Right sided pain can be appendicitis. Left sided pain in adults can be diverticulitis.)   You develop a fever (an oral temperature above 102 F (38.9 C)).   Diarrhea becomes excessive or contains blood or mucus.   You have excessive weakness, dizziness, fainting or extreme thirst.   You are not improving or you are getting worse.   You have any other questions or concerns.  Document Released: 06/03/2001 Document Revised: 05/29/2011 Document Reviewed: 06/09/2005  [employer] Patient Information 2012   [employer]

## 2012-01-07 ENCOUNTER — Emergency Department (HOSPITAL_COMMUNITY): Payer: Managed Care, Other (non HMO)

## 2012-01-07 ENCOUNTER — Emergency Department (HOSPITAL_COMMUNITY)
Admission: EM | Admit: 2012-01-07 | Discharge: 2012-01-07 | Disposition: A | Discharge status: Home / Self Care | Payer: Managed Care, Other (non HMO) | Attending: Emergency Medicine | Admitting: Emergency Medicine

## 2012-01-07 ENCOUNTER — Encounter (HOSPITAL_COMMUNITY): Payer: Self-pay | Admitting: Emergency Medicine

## 2012-01-07 DIAGNOSIS — M129 Arthropathy, unspecified: Secondary | ICD-10-CM | POA: Insufficient documentation

## 2012-01-07 DIAGNOSIS — E7800 Pure hypercholesterolemia, unspecified: Secondary | ICD-10-CM | POA: Insufficient documentation

## 2012-01-07 DIAGNOSIS — R079 Chest pain, unspecified: Secondary | ICD-10-CM

## 2012-01-07 DIAGNOSIS — Z87891 Personal history of nicotine dependence: Secondary | ICD-10-CM | POA: Insufficient documentation

## 2012-01-07 LAB — CBC
Hemoglobin: 15.2 g/dL (ref 13.0–17.0)
MCHC: 35 g/dL (ref 30.0–36.0)
WBC: 5.2 10*3/uL (ref 4.0–10.5)

## 2012-01-07 LAB — POCT I-STAT TROPONIN I: Troponin i, poc: 0 ng/mL (ref 0.00–0.08)

## 2012-01-07 LAB — COMPREHENSIVE METABOLIC PANEL
ALT: 19 U/L (ref 0–53)
Albumin: 4 g/dL (ref 3.5–5.2)
Alkaline Phosphatase: 77 U/L (ref 39–117)
Chloride: 106 mEq/L (ref 96–112)
GFR calc Af Amer: >90 mL/min (ref >90)
Glucose, Bld: 111 mg/dL — ABNORMAL HIGH (ref 70–99)
Potassium: 4.1 mEq/L (ref 3.5–5.1)
Sodium: 141 mEq/L (ref 135–145)
Total Bilirubin: 2.2 mg/dL — ABNORMAL HIGH (ref 0.3–1.2)
Total Protein: 6.9 g/dL (ref 6.0–8.3)

## 2012-01-07 MED ORDER — HYDROMORPHONE HCL PF 1 MG/ML IJ SOLN
1.0000 mg | Freq: Once | INTRAMUSCULAR | Status: AC
  Administered 2012-01-07: 1 mg via INTRAVENOUS
  Filled 2012-01-07: qty 1

## 2012-01-07 MED ORDER — MORPHINE SULFATE 2 MG/ML IJ SOLN
2.0000 mg | Freq: Once | INTRAMUSCULAR | Status: AC
  Administered 2012-01-07: 2 mg via INTRAVENOUS
  Filled 2012-01-07: qty 1

## 2012-01-07 MED ORDER — SODIUM CHLORIDE 0.9 % IV SOLN
Freq: Once | INTRAVENOUS | Status: AC
  Administered 2012-01-07: 10:00:00 via INTRAVENOUS

## 2012-01-07 MED ORDER — HYDROCODONE-ACETAMINOPHEN 5-325 MG PO TABS: 1.0000 | ORAL_TABLET | Freq: Four times a day (QID) | ORAL | Status: AC | PRN

## 2012-01-07 NOTE — ED Provider Notes (Signed)
History    This chart was scribed for Theodore Lennert, MD, MD by Smitty Pluck. The patient was seen in room APA07 and the patient's care was started at 9:28AM.   CSN: [id number]  Arrival date & time 01/07/12  8295   First MD Initiated Contact with Patient 01/07/12 [phone]      Chief Complaint  Patient presents with  . Chest Pain  . Shortness of Breath  . Dizziness    (Consider location/radiation/quality/duration/timing/severity/associated sxs/prior treatment) Patient is a 66 y.o. male presenting with chest pain and shortness of breath. The history is provided by the patient.  Chest Pain The chest pain began 3 - 5 days ago. Chest pain occurs constantly. The chest pain is worsening. The pain is associated with exertion and breathing. The quality of the pain is described as dull and pressure-like. The pain does not radiate. Primary symptoms include shortness of breath. He tried aspirin for the symptoms.  Pertinent negatives for past medical history include no MI.  Procedure history is negative for cardiac catheterization.    Shortness of Breath  Associated symptoms include chest pain and shortness of breath.   CORWYN Davis is a 66 y.o. male who presents to the Emergency Department complaining of moderate left chest pain onset 3 days ago. Pt reports pain as dull, pressure. Reports constant pain. Denies fevers and chills. Reports being dizzy and SOB. SOB is worsened by walking and exertion. Pt has taken asa without relief. Pt had stress test 1 year ago. Denies hx of MI, heart complications. Denies radiation. Pt reports having nodes. Cardiologist is Dr. Oletta Davis   Past Medical History  Diagnosis Date  . Lyme disease   . Arthritis 10/03/2010    right sternoclavicular joint  . Hyperlipidemia     hypercholesterolemia  . Heart murmur   . HOH (hard of hearing)     R ear  . Hemangioma of spine     lower lumbar    Past Surgical History  Procedure Date  . Cholecystectomy     . US echocardiography 08/2010  . Cardiovascular stress test 08/2010  . Tendon repair 2005    R elbow  . Mastoid debridement     R ear  . Hernia repair     R inguinal  . Total knee arthroplasty 05/28/2011    Procedure: TOTAL KNEE ARTHROPLASTY;  Surgeon: Loreta Ave, MD;  Location: [facility] OR;  Service: Orthopedics;  Laterality: Right;  Right total knee arthroplasty     Family History  Problem Relation Age of Onset  . Heart disease Mother   . Hypertension Mother   . Heart disease Father   . Arthritis Father     rheumatoid  . Cancer Father     Bladder    History  Substance Use Topics  . Smoking status: Former Smoker    Quit date: 07/12/2010  . Smokeless tobacco: Not on file  . Alcohol Use: No     occasional      Review of Systems  Respiratory: Positive for shortness of breath.   Cardiovascular: Positive for chest pain.  All other systems reviewed and are negative.   10 Systems reviewed and all are negative for acute change except as noted in the HPI.   Allergies  Codeine; Lipitor; and Sudafed  Home Medications   Current Outpatient Rx  Name Route Sig Dispense Refill  . SIMVASTATIN 20 MG PO TABS Oral Take 20 mg by mouth at bedtime.  BP 141/81  Pulse 61  Temp 98.4 F (36.9 C) (Oral)  Resp 18  SpO2 97%  Physical Exam  Nursing note and vitals reviewed. Constitutional: He is oriented to person, place, and time. He appears well-developed.  HENT:  Head: Normocephalic and atraumatic.  Eyes: Conjunctivae and EOM are normal. No scleral icterus.  Neck: Neck supple. No thyromegaly present.  Cardiovascular: Normal rate and regular rhythm.  Exam reveals no gallop and no friction rub.   No murmur heard. Pulmonary/Chest: No stridor. He has no wheezes. He has no rales. He exhibits no tenderness.  Abdominal: He exhibits no distension. There is no tenderness. There is no rebound.  Musculoskeletal: Normal range of motion. He exhibits no edema.  Lymphadenopathy:     He has no cervical adenopathy.  Neurological: He is oriented to person, place, and time. Coordination normal.  Skin: No rash noted. No erythema.  Psychiatric: He has a normal mood and affect. His behavior is normal.    ED Course  Procedures (including critical care time) DIAGNOSTIC STUDIES: Oxygen Saturation is 97% on room air, normal by my interpretation.    COORDINATION OF CARE: 9:35AM EDP discusses pt ED treatment with pt  9:35AM EDP ordered medication: 0.9% NaCl infusion, morphine 2 mg    Labs Reviewed  COMPREHENSIVE METABOLIC PANEL - Abnormal; Notable for the following:    Glucose, Bld 111 (*)     Total Bilirubin 2.2 (*)     GFR calc non Af Amer 87 (*)     All other components within normal limits  CBC  POCT I-STAT TROPONIN I   Dg Chest Portable 1 View  01/07/2012  *RADIOLOGY REPORT*  Clinical Data: 66 year old male with left chest pain and shortness of breath.  Dizziness.  PORTABLE CHEST - 1 VIEW  Comparison: 05/20/2011.  Findings: Portable upright AP view 0947 hours.  Stable and normal lung volumes with mild elevation of the right hemidiaphragm. Cardiac size and mediastinal contours are within normal limits. Visualized tracheal air column is within normal limits.  Allowing for portable technique, the lungs are clear.  No pneumothorax or effusion.  IMPRESSION: No acute cardiopulmonary abnormality.  Original Report Authenticated By: Harley Hallmark, M.D.     No diagnosis found.    Date: 01/07/2012  Rate:70  Rhythm: sinus arrhythmia  QRS Axis: left  Intervals: normal  ST/T Wave abnormalities: nonspecific ST changes  Conduction Disutrbances:none  Narrative Interpretation:   Old EKG Reviewed: unchanged   MDM    Pt with noncardiac pain.  Will have pt follow up     Theodore Lennert, MD 01/07/12 1431

## 2012-01-07 NOTE — ED Notes (Signed)
Chest pain with squeezing and tightness since Sunday morning. Has been pretty consistant per patient. Pt complaining of left side chest pain with SOB, dizziness and nausea at times.

## 2012-01-07 NOTE — ED Notes (Signed)
Bed adjusted per patient's request. Patient requesting something else for pain. Patient states "They gave me morphine and it helped for a little while but the pain came right back." Patient's nurse and EDPA made aware.

## 2012-10-15 ENCOUNTER — Encounter: Payer: Self-pay | Admitting: Family Medicine

## 2012-10-15 ENCOUNTER — Ambulatory Visit (INDEPENDENT_AMBULATORY_CARE_PROVIDER_SITE_OTHER): Payer: Managed Care, Other (non HMO) | Admitting: Family Medicine

## 2012-10-15 VITALS — BP 130/70 | HR 60 | Temp 98.5°F | Resp 16 | Ht 69.5 in | Wt 199.0 lb

## 2012-10-15 DIAGNOSIS — Z0000 Encounter for general adult medical examination without abnormal findings: Secondary | ICD-10-CM

## 2012-10-15 NOTE — Progress Notes (Signed)
Subjective:    Patient ID: Theodore Davis, male    DOB: Nov 04, 1945, 67 y.o.   MRN: [id number]  HPI Patient is here for a complete physical exam he is doing extremely well and denies any problems.  He has no medical concerns.  He continues to have some mild erectile dysfunction but otherwise is doing well.  His dad recently passed away in 05-Jul-2022 due to congestive heart failure. Past Medical History  Diagnosis Date  . Lyme disease   . Arthritis 10/03/2010    right sternoclavicular joint  . Hyperlipidemia     hypercholesterolemia  . Heart murmur   . HOH (hard of hearing)     R ear  . Hemangioma of spine     lower lumbar   Current Outpatient Prescriptions on File Prior to Visit  Medication Sig Dispense Refill  . aspirin 325 MG tablet Take 325 mg by mouth every 6 (six) hours as needed. For chest pain      . magnesium oxide (MAG-OX) 400 MG tablet Take 400 mg by mouth 2 (two) times daily.      Marland Kitchen OVER THE COUNTER MEDICATION Take 1 tablet by mouth daily as needed. Basiwalle? Extract for energy boost.      . simvastatin (ZOCOR) 20 MG tablet Take 20 mg by mouth at bedtime.         No current facility-administered medications on file prior to visit.   Allergies  Allergen Reactions  . Codeine Other (See Comments)    Unknown.  . Lipitor (Atorvastatin Calcium) Other (See Comments)    myalgias  . Sudafed (Pseudoephedrine Hcl) Other (See Comments)    unknown   History   Social History  . Marital Status: Married    Spouse Name: N/A    Number of Children: N/A  . Years of Education: N/A   Occupational History  . Not on file.   Social History Main Topics  . Smoking status: Former Smoker    Quit date: 07/12/2010  . Smokeless tobacco: Not on file  . Alcohol Use: No     Comment: occasional  . Drug Use: No  . Sexually Active:    Other Topics Concern  . Not on file   Social History Narrative  . No narrative on file   Family History  Problem Relation Age of Onset  . Heart  disease Mother   . Hypertension Mother   . Heart disease Father   . Arthritis Father     rheumatoid  . Cancer Father     Bladder      Review of Systems  Constitutional: Negative.   HENT: Negative.   Eyes: Negative.   Respiratory: Negative.   Cardiovascular: Negative.   Gastrointestinal: Negative.   Endocrine: Negative.   Genitourinary: Negative.   Musculoskeletal: Negative.   Skin: Negative.   Allergic/Immunologic: Negative.   Neurological: Negative.   Hematological: Negative.   Psychiatric/Behavioral: Negative.        Objective:   Physical Exam  Constitutional: He is oriented to person, place, and time. He appears well-developed and well-nourished.  HENT:  Head: Normocephalic and atraumatic.  Right Ear: External ear normal.  Left Ear: External ear normal.  Nose: Nose normal.  Mouth/Throat: Oropharynx is clear and moist. No oropharyngeal exudate.  Eyes: Conjunctivae and EOM are normal. Pupils are equal, round, and reactive to light. Right eye exhibits no discharge. Left eye exhibits no discharge. No scleral icterus.  Neck: Normal range of motion. Neck supple. No JVD present. No  thyromegaly present.  Cardiovascular: Normal rate, regular rhythm and intact distal pulses.  Exam reveals gallop (he has a frequent PVC which is auscultated).   Pulmonary/Chest: Effort normal and breath sounds normal. No respiratory distress. He has no wheezes. He has no rales. He exhibits no tenderness.  Abdominal: Soft. Bowel sounds are normal. He exhibits no distension and no mass. There is no tenderness. There is no rebound and no guarding.  Genitourinary: Rectum normal, prostate normal and penis normal. No penile tenderness.  Musculoskeletal: Normal range of motion. He exhibits no edema and no tenderness.  Lymphadenopathy:    He has no cervical adenopathy.  Neurological: He is alert and oriented to person, place, and time. He has normal reflexes. He displays normal reflexes. No cranial nerve  deficit. He exhibits normal muscle tone. Coordination normal.  Skin: Skin is warm and dry. No rash noted. No erythema. No pallor.  Psychiatric: He has a normal mood and affect. His behavior is normal. Judgment and thought content normal.          Assessment & Plan:  1. Routine general medical examination at a health care facility Patient's physical exam today is completely normal. He is overall doing extremely well. We discussed Zostavax at length. He will call his insurance and then decide if he wants it. His Pneumovax is not due until he is 67. His colonoscopy is up-to-date as it was done last in 2010. He is to return fasting for some screening lab work. - Basic Metabolic Panel - CBC with Differential - Hepatic Function Panel - Lipid Panel - PSA

## 2012-10-19 ENCOUNTER — Other Ambulatory Visit (INDEPENDENT_AMBULATORY_CARE_PROVIDER_SITE_OTHER): Payer: Managed Care, Other (non HMO)

## 2012-10-19 DIAGNOSIS — Z79899 Other long term (current) drug therapy: Secondary | ICD-10-CM

## 2012-10-19 DIAGNOSIS — Z0000 Encounter for general adult medical examination without abnormal findings: Secondary | ICD-10-CM

## 2012-10-19 LAB — LIPID PANEL
Cholesterol: 151 mg/dL (ref 0–200)
HDL: 49 mg/dL (ref >39)
LDL Cholesterol: 84 mg/dL (ref 0–99)
Total CHOL/HDL Ratio: 3.1 Ratio
Triglycerides: 92 mg/dL (ref <150)
VLDL: 18 mg/dL (ref 0–40)

## 2012-10-19 LAB — HEPATIC FUNCTION PANEL
ALT: 20 U/L (ref 0–53)
AST: 20 U/L (ref 0–37)
Albumin: 3.6 g/dL (ref 3.5–5.2)
Alkaline Phosphatase: 59 U/L (ref 39–117)
Bilirubin, Direct: 0.2 mg/dL (ref 0.0–0.3)
Indirect Bilirubin: 1.1 mg/dL — ABNORMAL HIGH (ref 0.0–0.9)
Total Bilirubin: 1.3 mg/dL — ABNORMAL HIGH (ref 0.3–1.2)
Total Protein: 6.1 g/dL (ref 6.0–8.3)

## 2012-10-19 LAB — CBC WITH DIFFERENTIAL/PLATELET
Basophils Absolute: 0 10*3/uL (ref 0.0–0.1)
Lymphocytes Relative: 24 % (ref 12–46)
Neutro Abs: 2.9 10*3/uL (ref 1.7–7.7)
Platelets: 200 10*3/uL (ref 150–400)
RBC: 4.73 MIL/uL (ref 4.22–5.81)
RDW: 13.7 % (ref 11.5–15.5)
WBC: 4.5 10*3/uL (ref 4.0–10.5)

## 2012-10-19 LAB — BASIC METABOLIC PANEL
BUN: 9 mg/dL (ref 6–23)
Calcium: 8.8 mg/dL (ref 8.4–10.5)
Glucose, Bld: 99 mg/dL (ref 70–99)
Sodium: 141 mEq/L (ref 135–145)

## 2012-10-20 ENCOUNTER — Encounter: Payer: Self-pay | Admitting: Family Medicine

## 2012-11-23 ENCOUNTER — Other Ambulatory Visit: Payer: Self-pay | Admitting: Family Medicine

## 2012-11-23 NOTE — Telephone Encounter (Signed)
Medication refilled per protocol. 

## 2013-03-07 ENCOUNTER — Telehealth: Payer: Self-pay | Admitting: Family Medicine

## 2013-03-07 NOTE — Telephone Encounter (Signed)
Pt would like Zostavax 0.65 mL SQ x1 sent to pharmacy

## 2013-03-08 MED ORDER — ZOSTER VACCINE LIVE 19400 UNT/0.65ML ~~LOC~~ SOLR: 0.6500 mL | Freq: Once | SUBCUTANEOUS | Status: DC

## 2013-03-08 NOTE — Telephone Encounter (Signed)
Zostavax Sent to [facility]

## 2013-03-22 ENCOUNTER — Telehealth: Payer: Self-pay | Admitting: Family Medicine

## 2013-03-22 DIAGNOSIS — H2510 Age-related nuclear cataract, unspecified eye: Secondary | ICD-10-CM | POA: Diagnosis not present

## 2013-03-22 MED ORDER — SIMVASTATIN 20 MG PO TABS: ORAL_TABLET | ORAL | Status: DC

## 2013-03-22 NOTE — Telephone Encounter (Signed)
Simvastatin 20 mg 1 QHS #90

## 2013-03-22 NOTE — Telephone Encounter (Signed)
Rx Refilled  

## 2013-04-26 ENCOUNTER — Telehealth: Payer: Self-pay | Admitting: Family Medicine

## 2013-04-26 DIAGNOSIS — H9319 Tinnitus, unspecified ear: Secondary | ICD-10-CM | POA: Diagnosis not present

## 2013-04-26 DIAGNOSIS — H908 Mixed conductive and sensorineural hearing loss, unspecified: Secondary | ICD-10-CM | POA: Diagnosis not present

## 2013-04-26 DIAGNOSIS — H9193 Unspecified hearing loss, bilateral: Secondary | ICD-10-CM

## 2013-04-26 NOTE — Telephone Encounter (Signed)
Patient is being seen at [facility] for a hearing test .  They are requesting a written referral.

## 2013-04-27 NOTE — Telephone Encounter (Signed)
Referral ok'd by [facility] and put in

## 2013-05-16 DIAGNOSIS — C44621 Squamous cell carcinoma of skin of unspecified upper limb, including shoulder: Secondary | ICD-10-CM | POA: Diagnosis not present

## 2013-05-16 DIAGNOSIS — D235 Other benign neoplasm of skin of trunk: Secondary | ICD-10-CM | POA: Diagnosis not present

## 2013-05-16 DIAGNOSIS — L820 Inflamed seborrheic keratosis: Secondary | ICD-10-CM | POA: Diagnosis not present

## 2013-05-16 DIAGNOSIS — L821 Other seborrheic keratosis: Secondary | ICD-10-CM | POA: Diagnosis not present

## 2013-06-08 DIAGNOSIS — Z85828 Personal history of other malignant neoplasm of skin: Secondary | ICD-10-CM | POA: Diagnosis not present

## 2013-09-26 ENCOUNTER — Other Ambulatory Visit: Payer: Self-pay | Admitting: Family Medicine

## 2013-09-26 NOTE — Telephone Encounter (Signed)
Refill appropriate and filled per protocol. 

## 2013-10-18 ENCOUNTER — Encounter: Payer: Self-pay | Admitting: Family Medicine

## 2013-10-18 ENCOUNTER — Ambulatory Visit (INDEPENDENT_AMBULATORY_CARE_PROVIDER_SITE_OTHER): Payer: 59 | Admitting: Family Medicine

## 2013-10-18 VITALS — BP 100/64 | HR 60 | Temp 97.6°F | Resp 16 | Ht 69.0 in | Wt 203.0 lb

## 2013-10-18 DIAGNOSIS — Z0000 Encounter for general adult medical examination without abnormal findings: Secondary | ICD-10-CM

## 2013-10-18 DIAGNOSIS — Z23 Encounter for immunization: Secondary | ICD-10-CM

## 2013-10-18 DIAGNOSIS — Z79899 Other long term (current) drug therapy: Secondary | ICD-10-CM

## 2013-10-18 LAB — CBC WITH DIFFERENTIAL/PLATELET
BASOS ABS: 0.1 10*3/uL (ref 0.0–0.1)
BASOS PCT: 1 % (ref 0–1)
EOS ABS: 0.2 10*3/uL (ref 0.0–0.7)
Eosinophils Relative: 4 % (ref 0–5)
HEMATOCRIT: 44.9 % (ref 39.0–52.0)
HEMOGLOBIN: 15.4 g/dL (ref 13.0–17.0)
Lymphocytes Relative: 25 % (ref 12–46)
Lymphs Abs: 1.4 10*3/uL (ref 0.7–4.0)
MCH: 30.1 pg (ref 26.0–34.0)
MCHC: 34.3 g/dL (ref 30.0–36.0)
MCV: 87.7 fL (ref 78.0–100.0)
MONO ABS: 0.5 10*3/uL (ref 0.1–1.0)
MONOS PCT: 9 % (ref 3–12)
NEUTROS ABS: 3.4 10*3/uL (ref 1.7–7.7)
Neutrophils Relative %: 61 % (ref 43–77)
Platelets: 231 10*3/uL (ref 150–400)
RBC: 5.12 MIL/uL (ref 4.22–5.81)
RDW: 13.6 % (ref 11.5–15.5)
WBC: 5.5 10*3/uL (ref 4.0–10.5)

## 2013-10-18 LAB — COMPLETE METABOLIC PANEL WITH GFR
ALBUMIN: 4.2 g/dL (ref 3.5–5.2)
ALK PHOS: 69 U/L (ref 39–117)
ALT: 21 U/L (ref 0–53)
AST: 21 U/L (ref 0–37)
BILIRUBIN TOTAL: 1 mg/dL (ref 0.2–1.2)
BUN: 14 mg/dL (ref 6–23)
CO2: 25 meq/L (ref 19–32)
Calcium: 8.7 mg/dL (ref 8.4–10.5)
Chloride: 105 mEq/L (ref 96–112)
Creat: 0.91 mg/dL (ref 0.50–1.35)
GFR, EST NON AFRICAN AMERICAN: 88 mL/min
GLUCOSE: 109 mg/dL — AB (ref 70–99)
POTASSIUM: 4.5 meq/L (ref 3.5–5.3)
SODIUM: 139 meq/L (ref 135–145)
TOTAL PROTEIN: 6.5 g/dL (ref 6.0–8.3)

## 2013-10-18 LAB — LIPID PANEL
Cholesterol: 163 mg/dL (ref 0–200)
HDL: 56 mg/dL (ref >39)
LDL CALC: 89 mg/dL (ref 0–99)
Total CHOL/HDL Ratio: 2.9 Ratio
Triglycerides: 92 mg/dL (ref <150)
VLDL: 18 mg/dL (ref 0–40)

## 2013-10-18 NOTE — Addendum Note (Signed)
Addended by: Shary Decamp B on: 10/18/2013 10:59 AM   Modules accepted: Orders

## 2013-10-18 NOTE — Progress Notes (Signed)
Subjective:    Patient ID: Theodore Davis, male    DOB: [birth year], 68 y.o.   MRN: [id number]  HPI  Subjective:   Patient presents for Medicare Annual/Subsequent preventive examination.  Patient has no complaints today. The depression screen is negative. He is due for Prevnar as well as Pneumovax. He has not had Zostavax. Past Medical History  Diagnosis Date  . Lyme disease   . Arthritis 10/03/2010    right sternoclavicular joint  . Hyperlipidemia     hypercholesterolemia  . Heart murmur   . HOH (hard of hearing)     R ear  . Hemangioma of spine     lower lumbar   Past Surgical History  Procedure Laterality Date  . Cholecystectomy    . US echocardiography  08/2010  . Cardiovascular stress test  08/2010  . Tendon repair  2005    R elbow  . Mastoid debridement      R ear  . Hernia repair      R inguinal  . Total knee arthroplasty  05/28/2011    Procedure: TOTAL KNEE ARTHROPLASTY;  Surgeon: Ninetta Lights, MD;  Location: [facility];  Service: Orthopedics;  Laterality: Right;  Right total knee arthroplasty    Current Outpatient Prescriptions on File Prior to Visit  Medication Sig Dispense Refill  . aspirin 325 MG tablet Take 325 mg by mouth every 6 (six) hours as needed. For chest pain      . magnesium oxide (MAG-OX) 400 MG tablet Take 400 mg by mouth 2 (two) times daily.      . simvastatin (ZOCOR) 20 MG tablet TAKE 1 TABLET NIGHTLY AT BEDTIME  90 tablet  0   No current facility-administered medications on file prior to visit.   Allergies  Allergen Reactions  . Codeine Other (See Comments)    Unknown.  . Lipitor [Atorvastatin Calcium] Other (See Comments)    myalgias  . Sudafed [Pseudoephedrine Hcl] Other (See Comments)    unknown   History   Social History  . Marital Status: Married    Spouse Name: N/A    Number of Children: N/A  . Years of Education: N/A   Occupational History  . Not on file.   Social History Main Topics  . Smoking status: Former Smoker   Quit date: 07/12/2010  . Smokeless tobacco: Not on file  . Alcohol Use: No     Comment: occasional  . Drug Use: No  . Sexual Activity:    Other Topics Concern  . Not on file   Social History Narrative  . No narrative on file   Family History  Problem Relation Age of Onset  . Heart disease Mother   . Hypertension Mother   . Heart disease Father   . Arthritis Father     rheumatoid  . Cancer Father     Bladder      Risk Factors  Current exercise habits: daily Dietary issues discussed: no issues identified  Cardiac risk factors: Obesity (BMI >= 30 kg/m2).   Depression Screen  (Note: if answer to either of the following is "Yes", a more complete depression screening is indicated)  Over the past two weeks, have you felt down, depressed or hopeless? No Over the past two weeks, have you felt little interest or pleasure in doing things? No Have you lost interest or pleasure in daily life? No Do you often feel hopeless? No Do you cry easily over simple problems? No   Activities of  Daily Living  In your present state of health, do you have any difficulty performing the following activities?:  Driving? No  Managing money? No  Feeding yourself? No  Getting from bed to chair? No  Climbing a flight of stairs? No  Preparing food and eating?: No  Bathing or showering? No  Getting dressed: No  Getting to the toilet? No  Using the toilet:No  Moving around from place to place: No  In the past year have you fallen or had a near fall?:No  Are you sexually active? No  Do you have more than one partner? No   Hearing Difficulties: No  Do you often ask people to speak up or repeat themselves? No  Do you experience ringing or noises in your ears? No Do you have difficulty understanding soft or whispered voices? No  Do you feel that you have a problem with memory? No Do you often misplace items? No  Do you feel safe at home? Yes  Cognitive Testing  Alert? Yes Normal  Appearance?Yes  Oriented to person? Yes Place? Yes  Time? Yes  Recall of three objects? Yes  Can perform simple calculations? Yes  Displays appropriate judgment?Yes  Can read the correct time from a watch face?Yes   Screening Tests / Date Colonoscopy   2010                  Zostavax never Influenza Vaccine annually Tetanus/tdap 2012  Medicare Attestation  I have personally reviewed:  The patient's medical and social history  Their use of alcohol, tobacco or illicit drugs  Their current medications and supplements  The patient's functional ability including ADLs,fall risks, home safety risks, cognitive, and hearing and visual impairment  Diet and physical activities  Evidence for depression or mood disorders  The patient's weight, height, BMI, and visual acuity have been recorded in the chart. I have made referrals, counseling, and provided education to the patient based on review of the above and I have provided the patient with a written personalized care plan for preventive services.       Review of Systems  All other systems reviewed and are negative.      Objective:   Physical Exam  Vitals reviewed. Constitutional: He is oriented to person, place, and time. He appears well-developed and well-nourished. No distress.  HENT:  Head: Normocephalic and atraumatic.  Right Ear: External ear normal.  Left Ear: External ear normal.  Nose: Nose normal.  Mouth/Throat: Oropharynx is clear and moist. No oropharyngeal exudate.  Eyes: Conjunctivae and EOM are normal. Pupils are equal, round, and reactive to light. Right eye exhibits no discharge. Left eye exhibits no discharge. No scleral icterus.  Neck: Normal range of motion. Neck supple. No JVD present. No tracheal deviation present. No thyromegaly present.  Cardiovascular: Normal rate, regular rhythm, normal heart sounds and intact distal pulses.  Exam reveals no gallop and no friction rub.   No murmur heard. Pulmonary/Chest:  Effort normal and breath sounds normal. No stridor. No respiratory distress. He has no wheezes. He has no rales. He exhibits no tenderness.  Abdominal: Soft. Bowel sounds are normal. He exhibits no distension and no mass. There is no tenderness. There is no rebound and no guarding.  Genitourinary: Rectum normal, prostate normal and penis normal. Guaiac negative stool. No penile tenderness.  Musculoskeletal: Normal range of motion. He exhibits no edema and no tenderness.  Lymphadenopathy:    He has no cervical adenopathy.  Neurological: He is alert and  oriented to person, place, and time. He has normal reflexes. He displays normal reflexes. No cranial nerve deficit. He exhibits normal muscle tone. Coordination normal.  Skin: Skin is warm. No rash noted. He is not diaphoretic. No erythema. No pallor.  Psychiatric: He has a normal mood and affect. His behavior is normal. Judgment and thought content normal.   patient has several lipomas on both forearms. He has a black 1 mm spot on his left nostril but he says has been there his entire life and he believes it is subcutaneous pencil lead.        Assessment & Plan:  1. Routine general medical examination at a health care facility Physical exam is completely normal. I will give the patient Prevnar 13 today. He can have Pneumovax 23 next year. I recommended Zostavax. His prostate exam is normal. His colonoscopy is up-to-date. The remainder of his preventive care is up to date. - COMPLETE METABOLIC PANEL WITH GFR - CBC with Differential - Lipid panel - PSA, Medicare  2. Encounter for long-term (current) use of other medications I will check a fasting lipid panel. Goal LDL is less than 130. - COMPLETE METABOLIC PANEL WITH GFR - CBC with Differential - Lipid panel - PSA, Medicare

## 2013-10-19 LAB — PSA, MEDICARE: PSA: 0.43 ng/mL (ref <=4.00)

## 2013-11-16 ENCOUNTER — Encounter: Payer: Self-pay | Admitting: Family Medicine

## 2013-11-16 ENCOUNTER — Ambulatory Visit (INDEPENDENT_AMBULATORY_CARE_PROVIDER_SITE_OTHER): Payer: 59 | Admitting: Family Medicine

## 2013-11-16 VITALS — BP 128/74 | HR 72 | Temp 97.9°F | Resp 16 | Ht 68.0 in | Wt 200.0 lb

## 2013-11-16 DIAGNOSIS — J209 Acute bronchitis, unspecified: Secondary | ICD-10-CM | POA: Diagnosis not present

## 2013-11-16 MED ORDER — AZITHROMYCIN 250 MG PO TABS: ORAL_TABLET | ORAL | Status: DC

## 2013-11-16 MED ORDER — PREDNISONE 10 MG PO TABS: ORAL_TABLET | ORAL | Status: DC

## 2013-11-16 MED ORDER — METHYLPREDNISOLONE ACETATE 40 MG/ML IJ SUSP
40.0000 mg | Freq: Once | INTRAMUSCULAR | Status: AC
  Administered 2013-11-16: 40 mg via INTRAMUSCULAR

## 2013-11-16 NOTE — Progress Notes (Signed)
Patient ID: KENITH TRICKEL, male   DOB: 04-16-1946, 68 y.o.   MRN: [id number]   Subjective:    Patient ID: JOSHUAN BOLANDER, male    DOB: [birth year], 68 y.o.   MRN: [id number]  Patient presents for Illness  patient here with cough with production and wheezing for the past week or more. He states it started when he was on a fishing trip in [place] sometime last week. He's not had any fever. Cough is productive of yellow sputum. He has been taking over-the-counter Mucinex which is been breaking up the mucus but otherwise no change. He does not have any history of COPD or emphysema ,he is a former smoker    Review Of Systems:  GEN- denies fatigue, fever, weight loss,weakness, recent illness HEENT- denies eye drainage, change in vision,+ nasal discharge, CVS- denies chest pain, palpitations RESP- denies SOB, +cough, +wheeze ABD- denies N/V, change in stools, abd pain Neuro- denies headache, dizziness, syncope, seizure activity       Objective:    BP 128/74  Pulse 72  Temp(Src) 97.9 F (36.6 C) (Oral)  Resp 16  Ht 5\' 8"  (1.727 m)  Wt 200 lb (90.719 kg)  BMI 30.42 kg/m2 GEN- NAD, alert and oriented x3 HEENT- PERRL, EOMI, non injected sclera, pink conjunctiva, MMM, oropharynx mild injection, TM clear bilat no effusion,  + maxillary sinus tenderness, inflammed turbinates,  Nasal drainage  Neck- Supple, no LAD CVS- RRR, no murmur RESP-CTAB EXT- No edema Pulses- Radial 2+         Assessment & Plan:      Problem List Items Addressed This Visit   None    Visit Diagnoses   Acute bronchitis    -  Primary    Zpak, Depo Medrol injection in office, prednisone 40mg  x 5 days, continue mucinex, CXR if not better       Note: This dictation was prepared with Dragon dictation along with smaller phrase technology. Any transcriptional errors that result from this process are unintentional.

## 2013-11-16 NOTE — Patient Instructions (Signed)
Start antibiotics Take prednisone starting tomorrow Call if not better, Chest xray will be done Continue mucinex F/U as needed

## 2013-11-23 ENCOUNTER — Ambulatory Visit (HOSPITAL_COMMUNITY)
Admission: RE | Admit: 2013-11-23 | Discharge: 2013-11-23 | Disposition: A | Discharge status: Home / Self Care | Payer: Medicare Other | Source: Ambulatory Visit | Attending: Family Medicine | Admitting: Family Medicine

## 2013-11-23 ENCOUNTER — Telehealth: Payer: Self-pay | Admitting: *Deleted

## 2013-11-23 DIAGNOSIS — R059 Cough, unspecified: Secondary | ICD-10-CM | POA: Insufficient documentation

## 2013-11-23 DIAGNOSIS — R05 Cough: Secondary | ICD-10-CM

## 2013-11-23 DIAGNOSIS — J9819 Other pulmonary collapse: Secondary | ICD-10-CM | POA: Diagnosis not present

## 2013-11-23 MED ORDER — BENZONATATE 100 MG PO CAPS: 100.0000 mg | ORAL_CAPSULE | Freq: Three times a day (TID) | ORAL | Status: DC | PRN

## 2013-11-23 MED ORDER — LEVOFLOXACIN 500 MG PO TABS: 500.0000 mg | ORAL_TABLET | Freq: Every day | ORAL | Status: DC

## 2013-11-23 NOTE — Telephone Encounter (Signed)
willl review CXR first

## 2013-11-23 NOTE — Telephone Encounter (Signed)
MD reviewed CXR and new orders obtained.   Call placed to patient and patient made aware.   Prescription sent to pharmacy.

## 2013-11-23 NOTE — Telephone Encounter (Signed)
Patient in office today.   States that his cough is not doing any better and he has completed ABTx.   Requested to have MD call in another medication.   Reviewed last note and noted that if cough continues, MD advised CXR.   Advised patient to have CXR performed and we will call him with results and MD recommendations.   MD to be made aware.

## 2014-01-05 ENCOUNTER — Telehealth: Payer: Self-pay | Admitting: Family Medicine

## 2014-01-05 MED ORDER — SIMVASTATIN 20 MG PO TABS
ORAL_TABLET | ORAL | Status: DC
Start: 2014-01-05 — End: 2014-09-17

## 2014-01-05 NOTE — Telephone Encounter (Signed)
Patient is calling to get refill on simvastatin  [phone] [facility]  Best number to call patient is [phone]

## 2014-01-05 NOTE — Telephone Encounter (Signed)
Med sent to pharm 

## 2014-05-03 ENCOUNTER — Encounter: Payer: Self-pay | Admitting: Family Medicine

## 2014-05-03 ENCOUNTER — Ambulatory Visit (INDEPENDENT_AMBULATORY_CARE_PROVIDER_SITE_OTHER): Payer: 59 | Admitting: Family Medicine

## 2014-05-03 VITALS — BP 132/78 | HR 82 | Temp 97.8°F | Resp 16 | Ht 69.0 in | Wt 209.0 lb

## 2014-05-03 DIAGNOSIS — R300 Dysuria: Secondary | ICD-10-CM

## 2014-05-03 LAB — URINALYSIS, ROUTINE W REFLEX MICROSCOPIC
Bilirubin Urine: NEGATIVE
Glucose, UA: NEGATIVE mg/dL
Hgb urine dipstick: NEGATIVE
Ketones, ur: NEGATIVE mg/dL
LEUKOCYTES UA: NEGATIVE
NITRITE: NEGATIVE
PROTEIN: NEGATIVE mg/dL
SPECIFIC GRAVITY, URINE: 1.01 (ref 1.005–1.030)
UROBILINOGEN UA: 0.2 mg/dL (ref 0.0–1.0)
pH: 5.5 (ref 5.0–8.0)

## 2014-05-03 MED ORDER — CIPROFLOXACIN HCL 500 MG PO TABS: 500.0000 mg | ORAL_TABLET | Freq: Two times a day (BID) | ORAL | Status: DC

## 2014-05-03 NOTE — Patient Instructions (Signed)
We will call with lab results Start antibiotics- will stop if everything is negative Increase water F/U as needed

## 2014-05-03 NOTE — Progress Notes (Signed)
Patient ID: Theodore Davis, male   DOB: 1945/10/06, 68 y.o.   MRN: [id number]   Subjective:    Patient ID: Theodore Davis, male    DOB: 07/10/1945, 68 y.o.   MRN: [id number]  Patient presents for Dysuria patient here with urinary frequency and some leaking as well as a burning sensation for the past 3 days. He's never really having problems with his urine stream in the past. He has not had any difficulties with his bowels. He denies any fever nausea vomiting or pain associated.   Review Of Systems:  GEN- denies fatigue, fever, weight loss,weakness, recent illness HEENT- denies eye drainage, change in vision, nasal discharge, CVS- denies chest pain, palpitations RESP- denies SOB, cough, wheeze ABD- denies N/V, change in stools, abd pain GU- +dysuria, hematuria, dribbling, incontinence MSK- denies joint pain, muscle aches, injury       Objective:    BP 132/78 mmHg  Pulse 82  Temp(Src) 97.8 F (36.6 C)  Resp 16  Ht 5\' 9"  (1.753 m)  Wt 209 lb (94.802 kg)  BMI 30.85 kg/m2 GEN- NAD, alert and oriented x3 CVS- RRR, no murmur RESP-CTAB ABD-NABS,soft,NT,ND, no CVA tenderness         Assessment & Plan:      Problem List Items Addressed This Visit    None    Visit Diagnoses    Dysuria    -  Primary    Concern initially for UTI but UA looks good, possible prostatitis, deferred prostate check, start Cipro, send Culture, he agrees to see urology if needed    Relevant Orders       Urinalysis, Routine w reflex microscopic (Completed)       Urine culture       Note: This dictation was prepared with Dragon dictation along with smaller phrase technology. Any transcriptional errors that result from this process are unintentional.

## 2014-05-05 LAB — URINE CULTURE
COLONY COUNT: NO GROWTH
ORGANISM ID, BACTERIA: NO GROWTH

## 2014-05-29 DIAGNOSIS — R339 Retention of urine, unspecified: Secondary | ICD-10-CM | POA: Diagnosis not present

## 2014-05-29 DIAGNOSIS — Z125 Encounter for screening for malignant neoplasm of prostate: Secondary | ICD-10-CM | POA: Diagnosis not present

## 2014-05-29 DIAGNOSIS — R300 Dysuria: Secondary | ICD-10-CM | POA: Diagnosis not present

## 2014-07-10 DIAGNOSIS — R339 Retention of urine, unspecified: Secondary | ICD-10-CM | POA: Diagnosis not present

## 2014-07-18 DIAGNOSIS — M25512 Pain in left shoulder: Secondary | ICD-10-CM | POA: Diagnosis not present

## 2014-07-18 DIAGNOSIS — M25561 Pain in right knee: Secondary | ICD-10-CM | POA: Diagnosis not present

## 2014-08-07 ENCOUNTER — Inpatient Hospital Stay
Admit: 2014-08-07 | Discharge: 2014-08-07 | Disposition: A | Discharge status: Home / Self Care | Attending: Emergency Medicine

## 2014-08-07 DIAGNOSIS — J019 Acute sinusitis, unspecified: Secondary | ICD-10-CM

## 2014-08-07 MED ORDER — PSEUDOEPH-BROMPHEN-DM 30-2-10 MG/5ML PO SYRP
2-30-10 MG/5ML | Freq: Four times a day (QID) | ORAL | Status: DC | PRN
Start: 2014-08-07 — End: 2016-01-08

## 2014-08-07 MED ORDER — AMOXICILLIN-POT CLAVULANATE 500-125 MG PO TABS
500-125 MG | ORAL_TABLET | Freq: Two times a day (BID) | ORAL | Status: AC
Start: 2014-08-07 — End: 2014-08-17

## 2014-08-07 NOTE — ED Provider Notes (Signed)
Patient is a 69 y.o. male presenting with URI. The history is provided by the patient.   URI  Presenting symptoms: congestion, cough, facial pain, fatigue and rhinorrhea    Presenting symptoms: no ear pain, no fever and no sore throat    Severity:  Moderate  Onset quality:  Gradual  Duration:  4 days  Progression:  Worsening  Chronicity:  New  Associated symptoms: no arthralgias, no headaches and no wheezing        Review of Systems   Constitutional: Positive for fatigue. Negative for fever and chills.   HENT: Positive for congestion and rhinorrhea. Negative for ear pain, sinus pressure and sore throat.    Eyes: Negative for pain, discharge and redness.   Respiratory: Positive for cough. Negative for shortness of breath and wheezing.    Cardiovascular: Negative for chest pain.   Gastrointestinal: Negative for nausea, vomiting, abdominal pain and diarrhea.   Genitourinary: Negative for dysuria and frequency.   Musculoskeletal: Negative for back pain and arthralgias.   Skin: Negative for rash and wound.   Neurological: Negative for weakness and headaches.   Hematological: Negative for adenopathy.   All other systems reviewed and are negative.      Physical Exam   Constitutional: He is oriented to person, place, and time. He appears well-developed and well-nourished.   HENT:   Head: Normocephalic and atraumatic.   Right Ear: Hearing, tympanic membrane and external ear normal.   Left Ear: Hearing, tympanic membrane and external ear normal.   Nose: Mucosal edema and rhinorrhea present. Right sinus exhibits no maxillary sinus tenderness and no frontal sinus tenderness. Left sinus exhibits no maxillary sinus tenderness and no frontal sinus tenderness.   Mouth/Throat: Uvula is midline, oropharynx is clear and moist and mucous membranes are normal. No trismus in the jaw. No uvula swelling.   Eyes: Conjunctivae, EOM and lids are normal. Pupils are equal, round, and reactive to light.   Neck: Normal range of motion. Neck  supple.   Cardiovascular: Normal rate, regular rhythm and normal heart sounds.    No murmur heard.  Pulmonary/Chest: Effort normal and breath sounds normal.   Abdominal: Soft. Bowel sounds are normal. There is no tenderness. There is no rigidity, no rebound, no guarding and no CVA tenderness.   Musculoskeletal: He exhibits no edema.   Neurological: He is alert and oriented to person, place, and time. He has normal strength. No cranial nerve deficit or sensory deficit. Coordination and gait normal. GCS eye subscore is 4. GCS verbal subscore is 5. GCS motor subscore is 6.   Skin: Skin is warm and dry. No abrasion and no rash noted.   Nursing note and vitals reviewed.      Procedures    MDM  --------------------------------------------- PAST HISTORY ---------------------------------------------  Past Medical History:  has a past medical history of Cancer (HCC).    Past Surgical History:  has past surgical history that includes Prostatectomy.    Social History:  reports that he has quit smoking. He does not have any smokeless tobacco history on file. He reports that he drinks alcohol.    Family History: family history is not on file.     The patient???s home medications have been reviewed.    Allergies: Review of patient's allergies indicates no known allergies.    -------------------------------------------------- RESULTS -------------------------------------------------  No results found for this visit on 08/07/14.  No orders to display       ------------------------- NURSING NOTES AND VITALS REVIEWED ---------------------------  The nursing notes within the ED encounter and vital signs as below have been reviewed.   BP 146/89 mmHg   Pulse 56   Temp(Src) 98 ??F (36.7 ??C) (Oral)   Resp 16   Wt 210 lb (95.255 kg)   SpO2 99%  Oxygen Saturation Interpretation: Normal      ------------------------------------------ PROGRESS NOTES ------------------------------------------   I have spoken with the patient and discussed  today???s results, in addition to providing specific details for the plan of care and counseling regarding the diagnosis and prognosis.  Their questions are answered at this time and they are agreeable with the plan.      --------------------------------- ADDITIONAL PROVIDER NOTES ---------------------------------          This patient is stable for discharge.  I have shared the specific conditions for return, as well as the importance of follow-up.        Delma Officer, MD  08/07/14 [phone]

## 2014-08-07 NOTE — Discharge Instructions (Signed)
Sinusitis: Care Instructions  Your Care Instructions     Sinusitis is an infection of the lining of the sinus cavities in your head. Sinusitis often follows a cold. It causes pain and pressure in your head and face.  In most cases, sinusitis gets better on its own in 1 to 2 weeks. But some mild symptoms may last for several weeks. Sometimes antibiotics are needed.  Follow-up care is a key part of your treatment and safety. Be sure to make and go to all appointments, and call your doctor if you are having problems. It's also a good idea to know your test results and keep a list of the medicines you take.  How can you care for yourself at home?   Take an over-the-counter pain medicine, such as acetaminophen (Tylenol), ibuprofen (Advil, Motrin), or naproxen (Aleve). Read and follow all instructions on the label.   If the doctor prescribed antibiotics, take them as directed. Do not stop taking them just because you feel better. You need to take the full course of antibiotics.   Be careful when taking over-the-counter cold or flu medicines and Tylenol at the same time. Many of these medicines have acetaminophen, which is Tylenol. Read the labels to make sure that you are not taking more than the recommended dose. Too much acetaminophen (Tylenol) can be harmful.   Breathe warm, moist air from a steamy shower, a hot bath, or a sink filled with hot water. Avoid cold, dry air. Using a humidifier in your home may help. Follow the directions for cleaning the machine.   Use saline (saltwater) nasal washes to help keep your nasal passages open and wash out mucus and bacteria. You can buy saline nose drops at a grocery store or drugstore. Or you can make your own at home by adding 1 teaspoon of salt and 1 teaspoon of baking soda to 2 cups of distilled water. If you make your own, fill a bulb syringe with the solution, insert the tip into your nostril, and squeeze gently. Blow your nose.   Put a hot, wet towel or a warm  gel pack on your face 3 or 4 times a day for 5 to 10 minutes each time.   Try a decongestant nasal spray like oxymetazoline (Afrin). Do not use it for more than 3 days in a row. Using it for more than 3 days can make your congestion worse.  When should you call for help?  Call your doctor now or seek immediate medical care if:   You have new or worse swelling or redness in your face or around your eyes.   You have a new or higher fever.  Watch closely for changes in your health, and be sure to contact your doctor if:   You have new or worse facial pain.   The mucus from your nose becomes thicker (like pus) or has new blood in it.   You are not getting better as expected.   Where can you learn more?   Go to [email or web address] and sign in to your MyChart account. Enter I933 in the Search Health Information box to learn more about "Sinusitis: Care Instructions."    If you do not have an account, please click on the "Sign Up Now" link.      2006-2015 [employer]. Care instructions adapted under license by [facility]. This care instruction is for use with your licensed healthcare professional. If you have questions about a medical condition or this instruction, always   ask your healthcare professional. [employer] disclaims any warranty or liability for your use of this information.  Content Version: 10.6.465758; Current as of: May 06, 2013

## 2014-09-17 ENCOUNTER — Other Ambulatory Visit: Payer: Self-pay | Admitting: Family Medicine

## 2014-09-20 DIAGNOSIS — M25512 Pain in left shoulder: Secondary | ICD-10-CM | POA: Diagnosis not present

## 2014-09-22 DIAGNOSIS — M25512 Pain in left shoulder: Secondary | ICD-10-CM | POA: Diagnosis not present

## 2014-09-23 ENCOUNTER — Encounter: Admit: 2014-09-23 | Primary: Internal Medicine

## 2014-09-23 ENCOUNTER — Inpatient Hospital Stay
Admit: 2014-09-23 | Discharge: 2014-09-23 | Disposition: A | Discharge status: Home / Self Care | Attending: Emergency Medicine

## 2014-09-23 DIAGNOSIS — J209 Acute bronchitis, unspecified: Secondary | ICD-10-CM

## 2014-09-23 MED ORDER — AZITHROMYCIN 250 MG PO TABS
250 MG | PACK | ORAL | Status: AC
Start: 2014-09-23 — End: 2014-10-03

## 2014-09-23 MED ORDER — ALBUTEROL SULFATE HFA 108 (90 BASE) MCG/ACT IN AERS
108 (90 Base) MCG/ACT | Freq: Four times a day (QID) | RESPIRATORY_TRACT | Status: DC | PRN
Start: 2014-09-23 — End: 2018-08-30

## 2014-09-23 MED ORDER — PREDNISONE 50 MG PO TABS
50 MG | ORAL_TABLET | ORAL | Status: AC
Start: 2014-09-23 — End: 2014-10-03

## 2014-09-23 MED ORDER — IPRATROPIUM-ALBUTEROL 0.5-2.5 (3) MG/3ML IN SOLN
Freq: Once | RESPIRATORY_TRACT | Status: AC
Start: 2014-09-23 — End: 2014-09-23
  Administered 2014-09-23: 14:00:00 1 via RESPIRATORY_TRACT

## 2014-09-23 MED FILL — IPRATROPIUM-ALBUTEROL 0.5-2.5 (3) MG/3ML IN SOLN: RESPIRATORY_TRACT | Qty: 3

## 2014-09-23 NOTE — ED Provider Notes (Signed)
Patient is a 69 year old male who presents with cough 1 week. Patient states the cough has been getting worse, he is getting short of breath and feels wheezy. He is a former smoker and has remote history of COPD. He's been having some sinus congestion as well. He denies recent fevers, chills, chest pain  Patient is a 69 y.o. male presenting with cough. The history is provided by the patient. No language interpreter was used.   Cough  Cough characteristics:  Productive  Sputum characteristics:  Unable to specify  Severity:  Moderate  Onset quality:  Gradual  Duration:  7 days  Timing:  Constant  Progression:  Worsening  Chronicity:  New  Smoker: former.    Context: sick contacts and upper respiratory infection    Relieved by:  Nothing  Worsened by:  Activity  Ineffective treatments: nyquil.  Associated symptoms: rhinorrhea, shortness of breath, sinus congestion, sore throat and wheezing    Associated symptoms: no chest pain, no chills, no diaphoresis, no ear pain, no eye discharge, no fever, no headaches, no myalgias and no rash        Review of Systems   Constitutional: Negative for fever, chills and diaphoresis.   HENT: Positive for rhinorrhea and sore throat. Negative for ear pain and facial swelling.    Eyes: Negative for pain, discharge and visual disturbance.   Respiratory: Positive for cough, shortness of breath and wheezing. Negative for stridor.    Cardiovascular: Negative for chest pain and leg swelling.   Gastrointestinal: Negative for nausea, vomiting, abdominal pain, diarrhea and constipation.   Endocrine: Negative for polyuria.   Genitourinary: Negative for dysuria and flank pain.   Musculoskeletal: Negative for myalgias, back pain, neck pain and neck stiffness.   Skin: Negative for rash.   Neurological: Negative for numbness and headaches.   Hematological: Does not bruise/bleed easily.   Psychiatric/Behavioral: The patient is not nervous/anxious.    All other systems reviewed and are  negative.      Physical Exam   Constitutional: He is oriented to person, place, and time. He appears well-developed and well-nourished. No distress.   HENT:   Head: Normocephalic.   Eyes: EOM are normal. Pupils are equal, round, and reactive to light.   Neck: Normal range of motion. Neck supple.   Cardiovascular: Normal rate, regular rhythm and normal heart sounds.    Pulmonary/Chest: Effort normal. No respiratory distress. He has wheezes in the right upper field, the right middle field, the right lower field, the left upper field, the left middle field and the left lower field. He has rhonchi in the right lower field and the left lower field. He exhibits no tenderness.   Abdominal: Soft. He exhibits no distension. There is no tenderness. There is no rebound.   Musculoskeletal: He exhibits no edema.   Neurological: He is alert and oriented to person, place, and time.   Skin: Skin is warm. No rash noted.   Psychiatric: He has a normal mood and affect. His behavior is normal.   Nursing note and vitals reviewed.      Procedures    MDM  On repeat exam, patient is doing well. He is currently taking his breathing treatment and states he is already feeling better. Lungs remain wheezy. Chest x-ray negative. Will treat for COPD exacerbation, acute bronchitis. Patient agrees with plan.Patient understands signs and symptoms for which to return to the ED any time. Patient will follow up with her PCP in 1-2 days. Patient is stable for  discharge.  --------------------------------------------- PAST HISTORY ---------------------------------------------  Past Medical History:  has a past medical history of Cancer (HCC).    Past Surgical History:  has past surgical history that includes Prostatectomy.    Social History:  reports that he has quit smoking. He does not have any smokeless tobacco history on file. He reports that he drinks about 0.6 oz of alcohol per week.    Family History: family history is not on file.     The  patient's home medications have been reviewed.    Allergies: Review of patient's allergies indicates no known allergies.    -------------------------------------------------- RESULTS -------------------------------------------------  Labs:  No results found for this visit on 09/23/14.    Radiology:  Xr Chest Standard Two Vw    09/23/2014   Location:100  Exam: XR CHEST STANDARD TWO VW  Comparison: None  History:  Cough wheezing  Findings: PA and lateral views of the chest show no evidence of active pulmonary infiltration. No pleural reaction or fluid is demonstrable. The heart is within normal limits as to size and configuration.  The mediastinal structures are not displaced.     09/23/2014   IMPRESSION:  Normal chest.  Reading location: 100   History:       ------------------------- NURSING NOTES AND VITALS REVIEWED ---------------------------  Date / Time Roomed:  09/23/2014  9:30 AM  ED Bed Assignment:  03/03    The nursing notes within the ED encounter and vital signs as below have been reviewed.   BP 135/86 mmHg  Pulse 67  Temp(Src) 98.3 F (36.8 C) (Oral)  Resp 16  Wt 210 lb (95.255 kg)  SpO2 97%  Oxygen Saturation Interpretation: Normal      ------------------------------------------ PROGRESS NOTES ------------------------------------------  I have spoken with the patient and discussed today's results, in addition to providing specific details for the plan of care and counseling regarding the diagnosis and prognosis.  Their questions are answered at this time and they are agreeable with the plan. I discussed at length with them reasons for immediate return here for re evaluation. They will followup with primary care by calling their office tomorrow.      --------------------------------- ADDITIONAL PROVIDER NOTES ---------------------------------  At this time the patient is without objective evidence of an acute process requiring hospitalization or inpatient management.  They have remained hemodynamically  stable throughout their entire ED visit and are stable for discharge with outpatient follow-up.     The plan has been discussed in detail and they are aware of the specific conditions for emergent return, as well as the importance of follow-up.      New Prescriptions    ALBUTEROL SULFATE HFA (PROAIR HFA) 108 (90 BASE) MCG/ACT INHALER    Inhale 2 puffs into the lungs every 6 hours as needed for Wheezing    AZITHROMYCIN (ZITHROMAX Z-PAK) 250 MG TABLET    TAKE  PO DAY ONE...  PO DAY TWO THROUGH FIVE   DISPENSE 6 TABS  NO REFILLS    PREDNISONE (DELTASONE) 50 MG TABLET    One tablet daily x 5 days       Diagnosis:  1. COPD (chronic obstructive pulmonary disease) with acute bronchitis (HCC)        Disposition:  Patient's disposition: Discharge to home  Patient's condition is stable.          Everardo Pacific, DO  09/23/14 1016

## 2014-09-30 ENCOUNTER — Inpatient Hospital Stay
Admit: 2014-09-30 | Discharge: 2014-09-30 | Disposition: A | Discharge status: Home / Self Care | Attending: Emergency Medicine

## 2014-09-30 DIAGNOSIS — R12 Heartburn: Secondary | ICD-10-CM

## 2014-09-30 MED ORDER — GI COCKTAIL
Freq: Once | Status: AC
Start: 2014-09-30 — End: 2014-09-30
  Administered 2014-09-30: 16:00:00 30 mL via ORAL

## 2014-09-30 MED ORDER — RANITIDINE HCL 150 MG PO TABS
150 MG | ORAL_TABLET | Freq: Two times a day (BID) | ORAL | Status: DC
Start: 2014-09-30 — End: 2018-07-02

## 2014-09-30 MED FILL — GI COCKTAIL: Qty: 30

## 2014-09-30 NOTE — ED Provider Notes (Addendum)
The history is provided by the patient.   patient is a 69 year old male with history of hypertension presents with heartburn. He reports that he has had some increasing gas and some burning. He reports is worse after he eats and when he lays down. He reports that when he is able to burp that he feels a little better. Patient denies any chest pain, shortness of breath, nausea, vomiting, sweats, or leg pain. Patient also denies any abdominal pain. Patient reports he's had these symptoms before. Patient reports he came in today as he got somewhat worse. Patient also felt like he had a little bit of fluttering earlier but denies this being a rapid heart or irregular heartbeat. He reports it is more elated to the heartburn. Lengthy discussion with the patient that I'm not able to safely rule out chest symptoms at this time. Although patient is not a smoker and does not have any history of coronary artery disease or hyperlipidemia. Patient's also has never had any problems before he reports he had a stress test years ago when he had his prostate surgery but otherwise he's been doing well. Patient reports that he thinks this is related to his eating as well as his anxiety. Patient is recently retired from [employer] and he has been taking online classes. Patient reports that he's had a lot of stress this week and his 1st week of retirement. Again we discussed concerning signs and symptoms and I offered him to get emergency department. I think his risk at this time is low and he declines. I think this is reasonable. We had a lengthy discussion on signs and symptoms she should watch for.    Review of Systems   Constitutional: Negative for fever, chills, diaphoresis and fatigue.   HENT: Negative for congestion, facial swelling, sore throat and trouble swallowing.    Eyes: Negative for pain.   Respiratory: Negative for cough, chest tightness and shortness of breath.    Cardiovascular: Negative for chest pain.   Gastrointestinal:  Negative for nausea, vomiting, abdominal pain and diarrhea.        Some feelings of acid reflux with burning and relieved by burping   Genitourinary: Negative for dysuria.   Musculoskeletal: Negative for back pain.   Skin: Negative for rash.   Neurological: Negative for headaches.       Physical Exam   Constitutional: He is oriented to person, place, and time. He appears well-developed. No distress.   HENT:   Head: Normocephalic and atraumatic.   Mouth/Throat: Oropharynx is clear and moist.   Eyes: EOM are normal. Pupils are equal, round, and reactive to light.   Neck: Normal range of motion. Neck supple. No tracheal deviation present.   Cardiovascular: Normal rate and regular rhythm.    No murmur heard.  Pulmonary/Chest: Effort normal and breath sounds normal. No stridor. No respiratory distress. He exhibits no tenderness.   Abdominal: Soft. Bowel sounds are normal. He exhibits no distension. There is no rebound and no guarding.   Musculoskeletal: Normal range of motion. He exhibits no tenderness.   Neurological: He is alert and oriented to person, place, and time. No cranial nerve deficit.   Skin: Skin is warm and dry. No rash noted.   Psychiatric: He has a normal mood and affect.       Procedures    MDM  Number of Diagnoses or Management Options   I had a long discussion with the patient about signs and symptoms watch for and when to  get emergency department. I also discussed the importance for sporadic care physician. Patient again understands the risks that we could miss a heart problem with the workup here I also discussed that I am not able to do full work up here. Patient reports that he understands and he feels better after the GI cocktail. Patient like to go home with some S reflux medication. I strongly encouraged him to get emergency department if he has any signs and symptoms of a heart attack and we discussed them at length. Patient verbalized his understanding.    Labs      Radiology      EKG  Interpretation.  EKG is normal sinus rhythm at a rate of 64. There is no prior for comparison, normal QRS, normal QT and PR intervals. No acute ST elevations or depressions.      Tacy LearnJason Blair Gervaisrofts, DO  09/30/14 [address]    Riham Polyakov Blair Dodgerofts, DO  09/30/14 [phone]

## 2014-10-05 LAB — EKG 12-LEAD
ECG Heart Rate: 64 /min
ECG P Duration: 126 ms
ECG QRS Axis: 0 deg
ECG QRS Duration: 98 ms
ECG QT Dispersion: 50 ms
ECG QTC Interval: 398 ms
ECG RR Interval: 937 ms
P Axis: 40 deg
P-R Interval: 174 ms
Q-T Interval: 386 ms
T Axis: 15 deg

## 2014-10-24 ENCOUNTER — Ambulatory Visit (INDEPENDENT_AMBULATORY_CARE_PROVIDER_SITE_OTHER): Payer: 59 | Admitting: Family Medicine

## 2014-10-24 ENCOUNTER — Encounter: Payer: Self-pay | Admitting: Family Medicine

## 2014-10-24 VITALS — BP 130/80 | HR 62 | Temp 98.4°F | Resp 16 | Ht 69.0 in | Wt 207.0 lb

## 2014-10-24 DIAGNOSIS — E785 Hyperlipidemia, unspecified: Secondary | ICD-10-CM

## 2014-10-24 DIAGNOSIS — Z23 Encounter for immunization: Secondary | ICD-10-CM

## 2014-10-24 DIAGNOSIS — R1901 Right upper quadrant abdominal swelling, mass and lump: Secondary | ICD-10-CM | POA: Diagnosis not present

## 2014-10-24 DIAGNOSIS — Z125 Encounter for screening for malignant neoplasm of prostate: Secondary | ICD-10-CM

## 2014-10-24 DIAGNOSIS — Z0000 Encounter for general adult medical examination without abnormal findings: Secondary | ICD-10-CM

## 2014-10-24 LAB — LIPID PANEL
CHOL/HDL RATIO: 2.6 ratio
CHOLESTEROL: 154 mg/dL (ref 0–200)
HDL: 59 mg/dL (ref >=40)
LDL Cholesterol: 78 mg/dL (ref 0–99)
Triglycerides: 83 mg/dL (ref <150)
VLDL: 17 mg/dL (ref 0–40)

## 2014-10-24 LAB — CBC WITH DIFFERENTIAL/PLATELET
BASOS ABS: 0 10*3/uL (ref 0.0–0.1)
Basophils Relative: 0 % (ref 0–1)
Eosinophils Absolute: 0.1 10*3/uL (ref 0.0–0.7)
Eosinophils Relative: 2 % (ref 0–5)
HCT: 45.4 % (ref 39.0–52.0)
HEMOGLOBIN: 15.4 g/dL (ref 13.0–17.0)
LYMPHS ABS: 1.2 10*3/uL (ref 0.7–4.0)
LYMPHS PCT: 22 % (ref 12–46)
MCH: 30.3 pg (ref 26.0–34.0)
MCHC: 33.9 g/dL (ref 30.0–36.0)
MCV: 89.4 fL (ref 78.0–100.0)
MPV: 10.5 fL (ref 8.6–12.4)
Monocytes Absolute: 0.6 10*3/uL (ref 0.1–1.0)
Monocytes Relative: 11 % (ref 3–12)
NEUTROS PCT: 65 % (ref 43–77)
Neutro Abs: 3.6 10*3/uL (ref 1.7–7.7)
Platelets: 229 10*3/uL (ref 150–400)
RBC: 5.08 MIL/uL (ref 4.22–5.81)
RDW: 13.5 % (ref 11.5–15.5)
WBC: 5.5 10*3/uL (ref 4.0–10.5)

## 2014-10-24 LAB — COMPLETE METABOLIC PANEL WITH GFR
ALT: 22 U/L (ref 0–53)
AST: 20 U/L (ref 0–37)
Albumin: 4.1 g/dL (ref 3.5–5.2)
Alkaline Phosphatase: 62 U/L (ref 39–117)
BUN: 14 mg/dL (ref 6–23)
CHLORIDE: 105 meq/L (ref 96–112)
CO2: 24 meq/L (ref 19–32)
Calcium: 8.5 mg/dL (ref 8.4–10.5)
Creat: 0.95 mg/dL (ref 0.50–1.35)
GFR, Est African American: >89 mL/min
GFR, Est Non African American: 83 mL/min
Glucose, Bld: 105 mg/dL — ABNORMAL HIGH (ref 70–99)
Potassium: 4.6 mEq/L (ref 3.5–5.3)
Sodium: 138 mEq/L (ref 135–145)
Total Bilirubin: 1.2 mg/dL (ref 0.2–1.2)
Total Protein: 6.6 g/dL (ref 6.0–8.3)

## 2014-10-24 NOTE — Progress Notes (Signed)
Subjective:    Patient ID: Theodore Davis, male    DOB: [birth year], 69 y.o.   MRN: [id number]  HPI  Patient is here today for complete physical exam. He has been seen a urologist. He has had a prostate exam recently. He is due for a PSA. Overall he denies any side effects or medical problems. Prevnar 13 and his tetanus vaccine are up-to-date. He is due for a shingles vaccine as well as Pneumovax 23. His colonoscopy was performed in 2010 and is up-to-date. It is not due again until 2020.  Past Medical History  Diagnosis Date  . Lyme disease   . Arthritis 10/03/2010    right sternoclavicular joint  . Hyperlipidemia     hypercholesterolemia  . Heart murmur   . HOH (hard of hearing)     R ear  . Hemangioma of spine     lower lumbar   Past Surgical History  Procedure Laterality Date  . Cholecystectomy    . US echocardiography  08/2010  . Cardiovascular stress test  08/2010  . Tendon repair  2005    R elbow  . Mastoid debridement      R ear  . Hernia repair      R inguinal  . Total knee arthroplasty  05/28/2011    Procedure: TOTAL KNEE ARTHROPLASTY;  Surgeon: Ninetta Lights, MD;  Location: [facility];  Service: Orthopedics;  Laterality: Right;  Right total knee arthroplasty    Current Outpatient Prescriptions on File Prior to Visit  Medication Sig Dispense Refill  . aspirin 325 MG tablet Take 325 mg by mouth every 6 (six) hours as needed. For chest pain    . ciprofloxacin (CIPRO) 500 MG tablet Take 1 tablet (500 mg total) by mouth 2 (two) times daily. 14 tablet 0  . magnesium oxide (MAG-OX) 400 MG tablet Take 400 mg by mouth 2 (two) times daily.    . simvastatin (ZOCOR) 20 MG tablet TAKE 1 TABLET NIGHTLY AT BEDTIME 90 tablet 1   No current facility-administered medications on file prior to visit.   Allergies  Allergen Reactions  . Codeine Other (See Comments)    Unknown.  . Lipitor [Atorvastatin Calcium] Other (See Comments)    myalgias  . Sudafed [Pseudoephedrine Hcl] Other  (See Comments)    unknown   History   Social History  . Marital Status: Married    Spouse Name: N/A  . Number of Children: N/A  . Years of Education: N/A   Occupational History  . Not on file.   Social History Main Topics  . Smoking status: Former Smoker    Quit date: 07/12/2010  . Smokeless tobacco: Not on file  . Alcohol Use: No     Comment: occasional  . Drug Use: No  . Sexual Activity: Not on file   Other Topics Concern  . Not on file   Social History Narrative   Family History  Problem Relation Age of Onset  . Heart disease Mother   . Hypertension Mother   . Heart disease Father   . Arthritis Father     rheumatoid  . Cancer Father     Bladder     Review of Systems  All other systems reviewed and are negative.      Objective:   Physical Exam  Constitutional: He is oriented to person, place, and time. He appears well-developed and well-nourished. No distress.  HENT:  Head: Normocephalic and atraumatic.  Right Ear: External ear normal.  Left Ear: External ear normal.  Nose: Nose normal.  Mouth/Throat: Oropharynx is clear and moist. No oropharyngeal exudate.  Eyes: Conjunctivae and EOM are normal. Pupils are equal, round, and reactive to light. Right eye exhibits no discharge. Left eye exhibits no discharge. No scleral icterus.  Neck: Normal range of motion. Neck supple. No JVD present. No tracheal deviation present. No thyromegaly present.  Cardiovascular: Normal rate, regular rhythm, normal heart sounds and intact distal pulses.  Exam reveals no gallop and no friction rub.   No murmur heard. Pulmonary/Chest: Effort normal and breath sounds normal. No respiratory distress. He has no wheezes. He has no rales. He exhibits no tenderness.  Abdominal: Soft. Bowel sounds are normal. He exhibits no distension and no mass. There is no tenderness. There is no rebound and no guarding.  Genitourinary: Rectum normal, prostate normal and penis normal.    Musculoskeletal: Normal range of motion. He exhibits no edema or tenderness.  Lymphadenopathy:    He has no cervical adenopathy.  Neurological: He is alert and oriented to person, place, and time. He has normal reflexes. He displays normal reflexes. No cranial nerve deficit. He exhibits normal muscle tone. Coordination normal.  Skin: Skin is warm. No rash noted. He is not diaphoretic. No erythema. No pallor.  Psychiatric: He has a normal mood and affect. His behavior is normal. Judgment and thought content normal.  Vitals reviewed.  Patient has a subcutaneous mass in the right upper quadrant. Is approximately 4 cm x 5 cm. It is firm and just below the surface of the skin. It may be a hard lipoma but I'm unsure at the present time       Assessment & Plan:  Routine general medical examination at a health care facility - Plan: CBC with Differential/Platelet, COMPLETE METABOLIC PANEL WITH GFR, Lipid panel  Prostate cancer screening - Plan: PSA, Medicare  HLD (hyperlipidemia) - Plan: CBC with Differential/Platelet, COMPLETE METABOLIC PANEL WITH GFR, Lipid panel  Abdominal mass, RUQ (right upper quadrant) - Plan: US Abdomen Complete  patient received Pneumovax 23 today in clinic. I will check a CBC, CMP, fasting lipid panel, and a PSA. I will obtain an ultrasound of the right upper quadrant to evaluate this mass further to ensure that it is a benign subcutaneous growth. Other cancer prevention is up-to-date and immunizations are up-to-date

## 2014-10-24 NOTE — Addendum Note (Signed)
Addended by: Shary Decamp B on: 10/24/2014 10:31 AM   Modules accepted: Orders

## 2014-10-25 LAB — PSA, MEDICARE: PSA: 0.41 ng/mL (ref <=4.00)

## 2014-10-26 ENCOUNTER — Encounter: Payer: Self-pay | Admitting: Family Medicine

## 2014-10-31 ENCOUNTER — Ambulatory Visit
Admission: RE | Admit: 2014-10-31 | Discharge: 2014-10-31 | Disposition: A | Discharge status: Home / Self Care | Payer: Managed Care, Other (non HMO) | Source: Ambulatory Visit | Attending: Family Medicine | Admitting: Family Medicine

## 2014-10-31 DIAGNOSIS — R1901 Right upper quadrant abdominal swelling, mass and lump: Secondary | ICD-10-CM

## 2014-10-31 DIAGNOSIS — K760 Fatty (change of) liver, not elsewhere classified: Secondary | ICD-10-CM | POA: Diagnosis not present

## 2014-10-31 DIAGNOSIS — Z9049 Acquired absence of other specified parts of digestive tract: Secondary | ICD-10-CM | POA: Diagnosis not present

## 2014-11-26 ENCOUNTER — Other Ambulatory Visit: Payer: Self-pay | Admitting: Physician Assistant

## 2014-11-28 ENCOUNTER — Encounter (HOSPITAL_BASED_OUTPATIENT_CLINIC_OR_DEPARTMENT_OTHER): Payer: Self-pay | Admitting: *Deleted

## 2014-11-29 ENCOUNTER — Other Ambulatory Visit: Payer: Self-pay | Admitting: Physician Assistant

## 2014-11-30 ENCOUNTER — Ambulatory Visit (HOSPITAL_BASED_OUTPATIENT_CLINIC_OR_DEPARTMENT_OTHER)
Admission: RE | Admit: 2014-11-30 | Discharge: 2014-11-30 | Disposition: A | Discharge status: Home / Self Care | Payer: Managed Care, Other (non HMO) | Source: Ambulatory Visit | Attending: Orthopedic Surgery | Admitting: Orthopedic Surgery

## 2014-11-30 ENCOUNTER — Encounter (HOSPITAL_BASED_OUTPATIENT_CLINIC_OR_DEPARTMENT_OTHER)
Admission: RE | Disposition: A | Discharge status: Home / Self Care | Payer: Self-pay | Source: Ambulatory Visit | Attending: Orthopedic Surgery

## 2014-11-30 ENCOUNTER — Ambulatory Visit (HOSPITAL_BASED_OUTPATIENT_CLINIC_OR_DEPARTMENT_OTHER): Payer: Managed Care, Other (non HMO) | Admitting: Anesthesiology

## 2014-11-30 ENCOUNTER — Encounter (HOSPITAL_BASED_OUTPATIENT_CLINIC_OR_DEPARTMENT_OTHER): Payer: Self-pay | Admitting: Anesthesiology

## 2014-11-30 ENCOUNTER — Other Ambulatory Visit: Payer: Self-pay | Admitting: Physician Assistant

## 2014-11-30 DIAGNOSIS — M7522 Bicipital tendinitis, left shoulder: Secondary | ICD-10-CM | POA: Diagnosis not present

## 2014-11-30 DIAGNOSIS — M25512 Pain in left shoulder: Secondary | ICD-10-CM | POA: Diagnosis not present

## 2014-11-30 DIAGNOSIS — G8918 Other acute postprocedural pain: Secondary | ICD-10-CM | POA: Diagnosis not present

## 2014-11-30 DIAGNOSIS — M75102 Unspecified rotator cuff tear or rupture of left shoulder, not specified as traumatic: Secondary | ICD-10-CM | POA: Insufficient documentation

## 2014-11-30 DIAGNOSIS — M19012 Primary osteoarthritis, left shoulder: Secondary | ICD-10-CM | POA: Diagnosis not present

## 2014-11-30 DIAGNOSIS — S46012A Strain of muscle(s) and tendon(s) of the rotator cuff of left shoulder, initial encounter: Secondary | ICD-10-CM | POA: Diagnosis not present

## 2014-11-30 DIAGNOSIS — Z87891 Personal history of nicotine dependence: Secondary | ICD-10-CM | POA: Diagnosis not present

## 2014-11-30 DIAGNOSIS — M12812 Other specific arthropathies, not elsewhere classified, left shoulder: Secondary | ICD-10-CM | POA: Diagnosis not present

## 2014-11-30 DIAGNOSIS — M7542 Impingement syndrome of left shoulder: Secondary | ICD-10-CM | POA: Insufficient documentation

## 2014-11-30 DIAGNOSIS — W01198A Fall on same level from slipping, tripping and stumbling with subsequent striking against other object, initial encounter: Secondary | ICD-10-CM | POA: Insufficient documentation

## 2014-11-30 DIAGNOSIS — M24112 Other articular cartilage disorders, left shoulder: Secondary | ICD-10-CM | POA: Diagnosis not present

## 2014-11-30 HISTORY — PX: SHOULDER ARTHROSCOPY WITH ROTATOR CUFF REPAIR AND SUBACROMIAL DECOMPRESSION: SHX5686

## 2014-11-30 HISTORY — PX: SHOULDER ARTHROSCOPY WITH BICEPSTENOTOMY: SHX6204

## 2014-11-30 HISTORY — PX: RESECTION DISTAL CLAVICAL: SHX5053

## 2014-11-30 LAB — POCT HEMOGLOBIN-HEMACUE: Hemoglobin: 14.5 g/dL (ref 13.0–17.0)

## 2014-11-30 SURGERY — SHOULDER ARTHROSCOPY WITH ROTATOR CUFF REPAIR AND SUBACROMIAL DECOMPRESSION
Anesthesia: Regional | Site: Shoulder | Laterality: Left

## 2014-11-30 MED ORDER — SUCCINYLCHOLINE CHLORIDE 20 MG/ML IJ SOLN
INTRAMUSCULAR | Status: DC | PRN
  Administered 2014-11-30: 100 mg via INTRAVENOUS

## 2014-11-30 MED ORDER — FENTANYL CITRATE (PF) 100 MCG/2ML IJ SOLN
INTRAMUSCULAR | Status: AC
  Filled 2014-11-30: qty 2

## 2014-11-30 MED ORDER — CEFAZOLIN SODIUM-DEXTROSE 2-3 GM-% IV SOLR
INTRAVENOUS | Status: AC
  Filled 2014-11-30: qty 50

## 2014-11-30 MED ORDER — OXYCODONE-ACETAMINOPHEN 5-325 MG PO TABS: 1.0000 | ORAL_TABLET | ORAL | Status: DC | PRN

## 2014-11-30 MED ORDER — HYDROMORPHONE HCL 1 MG/ML IJ SOLN
0.2500 mg | INTRAMUSCULAR | Status: DC | PRN
Start: 2014-11-30 — End: 2014-11-30

## 2014-11-30 MED ORDER — PROPOFOL 10 MG/ML IV BOLUS
INTRAVENOUS | Status: DC | PRN
  Administered 2014-11-30: 200 mg via INTRAVENOUS

## 2014-11-30 MED ORDER — OXYCODONE HCL 5 MG/5ML PO SOLN: 5.0000 mg | Freq: Once | ORAL | Status: DC | PRN

## 2014-11-30 MED ORDER — BUPIVACAINE-EPINEPHRINE (PF) 0.5% -1:200000 IJ SOLN
INTRAMUSCULAR | Status: DC | PRN
  Administered 2014-11-30: 25 mL via PERINEURAL

## 2014-11-30 MED ORDER — LACTATED RINGERS IV SOLN: INTRAVENOUS | Status: DC

## 2014-11-30 MED ORDER — OXYCODONE HCL 5 MG PO TABS: 5.0000 mg | ORAL_TABLET | Freq: Once | ORAL | Status: DC | PRN

## 2014-11-30 MED ORDER — LACTATED RINGERS IV SOLN
INTRAVENOUS | Status: DC
  Administered 2014-11-30: 07:00:00 via INTRAVENOUS

## 2014-11-30 MED ORDER — ONDANSETRON HCL 4 MG PO TABS: 4.0000 mg | ORAL_TABLET | Freq: Three times a day (TID) | ORAL | Status: DC | PRN

## 2014-11-30 MED ORDER — CHLORHEXIDINE GLUCONATE 4 % EX LIQD: 60.0000 mL | Freq: Once | CUTANEOUS | Status: DC

## 2014-11-30 MED ORDER — LIDOCAINE HCL (CARDIAC) 20 MG/ML IV SOLN
INTRAVENOUS | Status: DC | PRN
  Administered 2014-11-30: 60 mg via INTRAVENOUS

## 2014-11-30 MED ORDER — CEFAZOLIN SODIUM-DEXTROSE 2-3 GM-% IV SOLR
2.0000 g | INTRAVENOUS | Status: AC
  Administered 2014-11-30: 2 g via INTRAVENOUS

## 2014-11-30 MED ORDER — MIDAZOLAM HCL 2 MG/2ML IJ SOLN
INTRAMUSCULAR | Status: AC
  Filled 2014-11-30: qty 2

## 2014-11-30 MED ORDER — GLYCOPYRROLATE 0.2 MG/ML IJ SOLN
0.2000 mg | Freq: Once | INTRAMUSCULAR | Status: DC | PRN
Start: 2014-11-30 — End: 2014-11-30

## 2014-11-30 MED ORDER — ONDANSETRON HCL 4 MG/2ML IJ SOLN
INTRAMUSCULAR | Status: DC | PRN
  Administered 2014-11-30: 4 mg via INTRAVENOUS

## 2014-11-30 MED ORDER — SODIUM CHLORIDE 0.9 % IR SOLN
Status: DC | PRN
  Administered 2014-11-30: 17000 mL

## 2014-11-30 MED ORDER — FENTANYL CITRATE (PF) 100 MCG/2ML IJ SOLN
50.0000 ug | INTRAMUSCULAR | Status: DC | PRN
  Administered 2014-11-30: 25 ug via INTRAVENOUS
  Administered 2014-11-30: 100 ug via INTRAVENOUS

## 2014-11-30 MED ORDER — MIDAZOLAM HCL 2 MG/2ML IJ SOLN
1.0000 mg | INTRAMUSCULAR | Status: DC | PRN
Start: 2014-11-30 — End: 2014-11-30
  Administered 2014-11-30: 2 mg via INTRAVENOUS

## 2014-11-30 MED ORDER — MEPERIDINE HCL 25 MG/ML IJ SOLN: 6.2500 mg | INTRAMUSCULAR | Status: DC | PRN

## 2014-11-30 MED ORDER — EPHEDRINE SULFATE 50 MG/ML IJ SOLN
INTRAMUSCULAR | Status: DC | PRN
  Administered 2014-11-30: 5 mg via INTRAVENOUS
  Administered 2014-11-30: 10 mg via INTRAVENOUS

## 2014-11-30 MED ORDER — DEXAMETHASONE SODIUM PHOSPHATE 4 MG/ML IJ SOLN
INTRAMUSCULAR | Status: DC | PRN
  Administered 2014-11-30: 10 mg via INTRAVENOUS

## 2014-11-30 SURGICAL SUPPLY — 75 items
ANCH SUT SWLK 19.1X5.5 CLS EL (Anchor) ×2 IMPLANT
ANCHOR PEEK SWIVEL LOCK 5.5 (Anchor) ×4 IMPLANT
APL SKNCLS STERI-STRIP NONHPOA (GAUZE/BANDAGES/DRESSINGS)
BENZOIN TINCTURE PRP APPL 2/3 (GAUZE/BANDAGES/DRESSINGS) IMPLANT
BLADE CUTTER GATOR 3.5 (BLADE) ×3 IMPLANT
BLADE CUTTER MENIS 5.5 (BLADE) IMPLANT
BLADE GREAT WHITE 4.2 (BLADE) ×2 IMPLANT
BLADE GREAT WHITE 4.2MM (BLADE) ×1
BLADE SURG 15 STRL LF DISP TIS (BLADE) ×1 IMPLANT
BLADE SURG 15 STRL SS (BLADE) ×3
BUR OVAL 6.0 (BURR) ×3 IMPLANT
CANNULA DRY DOC 8X75 (CANNULA) ×2 IMPLANT
CANNULA TWIST IN 8.25X7CM (CANNULA) IMPLANT
CLOSURE WOUND 1/2 X4 (GAUZE/BANDAGES/DRESSINGS)
DECANTER SPIKE VIAL GLASS SM (MISCELLANEOUS) IMPLANT
DRAPE STERI 35X30 U-POUCH (DRAPES) ×3 IMPLANT
DRAPE U-SHAPE 47X51 STRL (DRAPES) ×3 IMPLANT
DRAPE U-SHAPE 76X120 STRL (DRAPES) ×6 IMPLANT
DRSG PAD ABDOMINAL 8X10 ST (GAUZE/BANDAGES/DRESSINGS) ×3 IMPLANT
DURAPREP 26ML APPLICATOR (WOUND CARE) ×3 IMPLANT
ELECT MENISCUS 165MM 90D (ELECTRODE) ×3 IMPLANT
ELECT REM PT RETURN 9FT ADLT (ELECTROSURGICAL) ×3
ELECTRODE REM PT RTRN 9FT ADLT (ELECTROSURGICAL) ×1 IMPLANT
GAUZE SPONGE 4X4 12PLY STRL (GAUZE/BANDAGES/DRESSINGS) ×6 IMPLANT
GAUZE XEROFORM 1X8 LF (GAUZE/BANDAGES/DRESSINGS) ×3 IMPLANT
GLOVE BIOGEL PI IND STRL 7.0 (GLOVE) ×1 IMPLANT
GLOVE BIOGEL PI INDICATOR 7.0 (GLOVE) ×8
GLOVE ECLIPSE 6.5 STRL STRAW (GLOVE) ×2 IMPLANT
GLOVE ECLIPSE 7.0 STRL STRAW (GLOVE) ×3 IMPLANT
GLOVE ORTHO TXT STRL SZ7.5 (GLOVE) ×1 IMPLANT
GLOVE SURG ORTHO 8.0 STRL STRW (GLOVE) ×3 IMPLANT
GOWN STRL REUS W/ TWL LRG LVL3 (GOWN DISPOSABLE) ×2 IMPLANT
GOWN STRL REUS W/ TWL XL LVL3 (GOWN DISPOSABLE) ×1 IMPLANT
GOWN STRL REUS W/TWL LRG LVL3 (GOWN DISPOSABLE) ×6
GOWN STRL REUS W/TWL XL LVL3 (GOWN DISPOSABLE) ×3
IV NS IRRIG 3000ML ARTHROMATIC (IV SOLUTION) ×16 IMPLANT
MANIFOLD NEPTUNE II (INSTRUMENTS) ×3 IMPLANT
NDL SCORPION MULTI FIRE (NEEDLE) IMPLANT
NDL SUT 6 .5 CRC .975X.05 MAYO (NEEDLE) IMPLANT
NEEDLE MAYO TAPER (NEEDLE)
NEEDLE SCORPION MULTI FIRE (NEEDLE) ×3 IMPLANT
NS IRRIG 1000ML POUR BTL (IV SOLUTION) IMPLANT
PACK ARTHROSCOPY DSU (CUSTOM PROCEDURE TRAY) ×3 IMPLANT
PACK BASIN DAY SURGERY FS (CUSTOM PROCEDURE TRAY) ×3 IMPLANT
PASSER SUT SWANSON 36MM LOOP (INSTRUMENTS) IMPLANT
PENCIL BUTTON HOLSTER BLD 10FT (ELECTRODE) ×3 IMPLANT
SET ARTHROSCOPY TUBING (MISCELLANEOUS) ×3
SET ARTHROSCOPY TUBING LN (MISCELLANEOUS) ×1 IMPLANT
SLEEVE SCD COMPRESS KNEE MED (MISCELLANEOUS) ×2 IMPLANT
SLING ARM IMMOBILIZER LRG (SOFTGOODS) IMPLANT
SLING ARM IMMOBILIZER MED (SOFTGOODS) IMPLANT
SLING ARM IMMOBILIZER XL (CAST SUPPLIES) ×2 IMPLANT
SLING ARM LRG ADULT FOAM STRAP (SOFTGOODS) IMPLANT
SLING ARM MED ADULT FOAM STRAP (SOFTGOODS) IMPLANT
SLING ARM XL FOAM STRAP (SOFTGOODS) IMPLANT
SPONGE LAP 4X18 X RAY DECT (DISPOSABLE) IMPLANT
STRIP CLOSURE SKIN 1/2X4 (GAUZE/BANDAGES/DRESSINGS) IMPLANT
SUCTION FRAZIER TIP 10 FR DISP (SUCTIONS) IMPLANT
SUT ETHIBOND 2 OS 4 DA (SUTURE) IMPLANT
SUT ETHILON 2 0 FS 18 (SUTURE) IMPLANT
SUT ETHILON 3 0 PS 1 (SUTURE) ×2 IMPLANT
SUT FIBERWIRE #2 38 T-5 BLUE (SUTURE)
SUT RETRIEVER MED (INSTRUMENTS) IMPLANT
SUT TIGER TAPE 7 IN WHITE (SUTURE) ×2 IMPLANT
SUT VIC AB 0 CT1 27 (SUTURE)
SUT VIC AB 0 CT1 27XBRD ANBCTR (SUTURE) IMPLANT
SUT VIC AB 2-0 SH 27 (SUTURE)
SUT VIC AB 2-0 SH 27XBRD (SUTURE) IMPLANT
SUT VIC AB 3-0 FS2 27 (SUTURE) IMPLANT
SUTURE FIBERWR #2 38 T-5 BLUE (SUTURE) IMPLANT
TAPE FIBER 2MM 7IN #2 BLUE (SUTURE) ×2 IMPLANT
TOWEL OR 17X24 6PK STRL BLUE (TOWEL DISPOSABLE) ×3 IMPLANT
TOWEL OR NON WOVEN STRL DISP B (DISPOSABLE) ×3 IMPLANT
WATER STERILE IRR 1000ML POUR (IV SOLUTION) ×3 IMPLANT
YANKAUER SUCT BULB TIP NO VENT (SUCTIONS) IMPLANT

## 2014-11-30 NOTE — Anesthesia Preprocedure Evaluation (Signed)
Anesthesia Evaluation  Patient identified by MRN, date of birth, ID band Patient awake    Reviewed: Allergy & Precautions, NPO status , Patient's Chart, lab work & pertinent test results  Airway Mallampati: I  TM Distance: >3 FB Neck ROM: Full    Dental  (+) Teeth Intact, Dental Advisory Given   Pulmonary former smoker,  breath sounds clear to auscultation        Cardiovascular Rhythm:Regular Rate:Normal     Neuro/Psych    GI/Hepatic   Endo/Other    Renal/GU      Musculoskeletal   Abdominal   Peds  Hematology   Anesthesia Other Findings   Reproductive/Obstetrics                             Anesthesia Physical Anesthesia Plan  ASA: I  Anesthesia Plan: General and Regional   Post-op Pain Management:    Induction: Intravenous  Airway Management Planned: Oral ETT  Additional Equipment:   Intra-op Plan:   Post-operative Plan: Extubation in OR  Informed Consent: I have reviewed the patients History and Physical, chart, labs and discussed the procedure including the risks, benefits and alternatives for the proposed anesthesia with the patient or authorized representative who has indicated his/her understanding and acceptance.   Dental advisory given  Plan Discussed with: CRNA, Surgeon and Anesthesiologist  Anesthesia Plan Comments:         Anesthesia Quick Evaluation

## 2014-11-30 NOTE — Discharge Instructions (Signed)
Shouder arthroscopy, rotator cuff repair, subacromial decompression °Care After Instructions °Refer to this sheet in the next few weeks. These discharge instructions provide you with general information on caring for yourself after you leave the hospital. Your caregiver may also give you specific instructions. Your treatment has been planned according to the most current medical practices available, but unavoidable complications sometimes occur. If you have any problems or questions after discharge, please call your caregiver. °HOME INSTRUCTIONS °You may resume a normal diet and activities as directed.  °Take showers instead of baths until informed otherwise.  °Change bandages (dressings) in 3 days.  Swab wounds daily with betadine.  Wash shoulder with soap and water.  Pat dry.  Cover wounds with bandaids. °Only take over-the-counter or prescription medicines for pain, discomfort, or fever as directed by your caregiver.  °Wear your sling for the next 6 weeks unless otherwise instructed. °Eat a well-balanced diet.  °Avoid lifting or driving until you are instructed otherwise.  °Make an appointment to see your caregiver for stitches (suture) or staple removal one week after surgery.  ° °SEEK MEDICAL CARE IF: °You have swelling of your calf or leg.  °You develop shortness of breath or chest pain.  °You have redness, swelling, or increasing pain in the wound.  °There is pus or any unusual drainage coming from the surgical site.  °You notice a bad smell coming from the surgical site or dressing.  °The surgical site breaks open after sutures or staples have been removed.  °There is persistent bleeding from the suture or staple line.  °You are getting worse or are not improving.  °You have any other questions or concerns.  °SEEK IMMEDIATE MEDICAL CARE IF:  °You have a fever greater than 101 °You develop a rash.  °You have difficulty breathing.  °You develop any reaction or side effects to medicines given.  °Your knee  motion is decreasing rather than improving.  °MAKE SURE YOU:  °Understand these instructions.  °Will watch your condition.  °Will get help right away if you are not doing well or get worse.  ° ° °Post Anesthesia Home Care Instructions ° °Activity: °Get plenty of rest for the remainder of the day. A responsible adult should stay with you for 24 hours following the procedure.  °For the next 24 hours, DO NOT: °-Drive a car °-Operate machinery °-Drink alcoholic beverages °-Take any medication unless instructed by your physician °-Make any legal decisions or sign important papers. ° °Meals: °Start with liquid foods such as gelatin or soup. Progress to regular foods as tolerated. Avoid greasy, spicy, heavy foods. If nausea and/or vomiting occur, drink only clear liquids until the nausea and/or vomiting subsides. Call your physician if vomiting continues. ° °Special Instructions/Symptoms: °Your throat may feel dry or sore from the anesthesia or the breathing tube placed in your throat during surgery. If this causes discomfort, gargle with warm salt water. The discomfort should disappear within 24 hours. ° °If you had a scopolamine patch placed behind your ear for the management of post- operative nausea and/or vomiting: ° °1. The medication in the patch is effective for 72 hours, after which it should be removed.  Wrap patch in a tissue and discard in the trash. Wash hands thoroughly with soap and water. °2. You may remove the patch earlier than 72 hours if you experience unpleasant side effects which may include dry mouth, dizziness or visual disturbances. °3. Avoid touching the patch. Wash your hands with soap and water after contact with   the patch. °  °Regional Anesthesia Blocks ° °1. Numbness or the inability to move the "blocked" extremity may last from 3-48 hours after placement. The length of time depends on the medication injected and your individual response to the medication. If the numbness is not going away  after 48 hours, call your surgeon. ° °2. The extremity that is blocked will need to be protected until the numbness is gone and the  Strength has returned. Because you cannot feel it, you will need to take extra care to avoid injury. Because it may be weak, you may have difficulty moving it or using it. You may not know what position it is in without looking at it while the block is in effect. ° °3. For blocks in the legs and feet, returning to weight bearing and walking needs to be done carefully. You will need to wait until the numbness is entirely gone and the strength has returned. You should be able to move your leg and foot normally before you try and bear weight or walk. You will need someone to be with you when you first try to ensure you do not fall and possibly risk injury. ° °4. Bruising and tenderness at the needle site are common side effects and will resolve in a few days. ° °5. Persistent numbness or new problems with movement should be communicated to the surgeon or the [facility] ([phone])/ [facility] ([phone]). ° °

## 2014-11-30 NOTE — Transfer of Care (Signed)
Immediate Anesthesia Transfer of Care Note  Patient: Theodore Davis  Procedure(s) Performed: Procedure(s): LEFT SHOULDER ARTHROSCOPY WITH DISTAL CLAVICAL EXCISION, ACROMIOPLASTY, ROTATOR CUFF REPAIR , BICEPS TENOTOMY (Left) RESECTION DISTAL CLAVICAL (Left) SHOULDER ARTHROSCOPY WITH BICEPSTENOTOMY (Left)  Patient Location: PACU  Anesthesia Type:General  Level of Consciousness: awake and sedated  Airway & Oxygen Therapy: Patient Spontanous Breathing and Patient connected to face mask oxygen  Post-op Assessment: Report given to RN and Post -op Vital signs reviewed and stable  Post vital signs: Reviewed and stable  Last Vitals:  Filed Vitals:   11/30/14 0730  BP: 144/78  Pulse: 72  Temp:   Resp: 16    Complications: No apparent anesthesia complications

## 2014-11-30 NOTE — Anesthesia Postprocedure Evaluation (Signed)
  Anesthesia Post-op Note  Patient: Theodore Davis  Procedure(s) Performed: Procedure(s): LEFT SHOULDER ARTHROSCOPY WITH DISTAL CLAVICAL EXCISION, ACROMIOPLASTY, ROTATOR CUFF REPAIR , BICEPS TENOTOMY (Left) RESECTION DISTAL CLAVICAL (Left) SHOULDER ARTHROSCOPY WITH BICEPSTENOTOMY (Left)  Patient Location: PACU  Anesthesia Type: General, Regional   Level of Consciousness: awake, alert  and oriented  Airway and Oxygen Therapy: Patient Spontanous Breathing  Post-op Pain: none  Post-op Assessment: Post-op Vital signs reviewed  Post-op Vital Signs: Reviewed  Last Vitals:  Filed Vitals:   11/30/14 1030  BP: 123/72  Pulse: 66  Temp: 36.7 C  Resp: 18    Complications: No apparent anesthesia complications

## 2014-11-30 NOTE — H&P (View-Only) (Signed)
07-18-14 This is a 69 year-old gentleman who presents to our clinic today with left shoulder pain.  He said about five days ago he was walking on his property when he tripped and fell into a tree hurting his left shoulder.  At that time he heard a pop as well.  This has not gotten any better, in fact it was made worse after shoveling snow over this past weekend.  He describes his pain as a dull throb over the anterior aspect of his shoulder.  He has a fair amount of pain reaching behind his back, not so much with lifting his arm.   Lelynd is also here for follow up of his right total knee replacement.  This was done on May 28, 2011.  He has been doing extremely well without any complaints in regards to his knee. Past medical, social and family history reviewed in detail on the patient questionnaire and signed.  Review of systems: As detailed in HPI.  All others reviewed and are negative.   EXAMINATION: Well-developed, well-nourished male in no acute distress.  Alert and oriented x 3.  Examination of his left shoulder reveals full active range of motion.  He can internally rotate to L5, however this causes a fair amount of pain.  Positive empty can.  Negative drop arm.  He is neurovascularly intact distally.  Examination of his right knee reveals range of motion from 0-130 degrees.  Stable to valgus and varus stress.  He is neurovascularly intact distally.  X-RAYS: Three views of his right knee reveal well seated prosthesis without evidence of subsidence or osteolysis.  Three views of his left shoulder reveals a Type II acromion.  Mild AC arthropathy.  Good glenohumeral space and no superior migration of the humeral head.    IMPRESSION: 1. Status post above surgery, right knee. 2. Rotator cuff bursitis, left shoulder.   PLAN: At this point in time we do not feel as though Nekhi has torn his rotator cuff tendon.  I do believe he really just aggravated this when he fell into the tree.  We are  going to proceed with a 1:4 subacromial Corticosteroid injection, as well as Jobe exercises today.  If he is not any better in the next 2-3 weeks he will call and we will get an MRI to further delineate the pathology.  In regards to his right knee he is to follow up with [place] in 12 months time for repeat evaluation and x-ray.    PROCEDURE NOTE: The patient's clinical condition is marked by substantial pain and/or significant functional disability.  Other conservative therapy has not provided relief, is contraindicated, or not appropriate.  There is a reasonable likelihood that injection will significantly improve the patient's pain and/or functional disability. Patient is seated on the exam table.  The left shoulder is prepped with Betadine and alcohol and injected into the subacromial space with 1:4 Depo-Medrol/Marcaine.  Patient tolerated the procedure without difficulty.   Ninetta Lights, M.D.  09-22-14 Chrissie Noa comes in for follow up of his left shoulder.  MRI is completed and I looked at this.  He has extensive, at least partial thickness tearing, supraspinatus and subscapularis.  Injection we did lasted for 3-4 days, but not beyond that.  The scan states that they don't see a full thickness tear, but in looking at this he is very close to that at the top of the subscapularis, as well as in the mid portion of the supraspinatus.  Obviously nothing retracted.  DISPOSITION:  I spent 25 minutes with Chrissie Noa face-to-face discussing his dilemma.  All of his symptoms began acutely from a traumatic injury occurring earlier this year.  He is getting worse rather than better.  Everything he is telling me is consistent with a functional full thickness cuff tear.  We have talked about trying to wait this out, but I don't think time is going to resolve things.  He has a fishing trip coming up in May.  We are going to see him just prior to that to re-inject him and give him a little comfort for that trip, which  he has had planned for a long time.  Following that we have discussed proceeding with definitive treatment.  Exam under anesthesia, arthroscopy, decompression, with possible cuff repair.  That procedure, risks, benefits, complications and anticipated recovery and rehab thoroughly discussed and outlined.  Paperwork complete.  All questions answered.  We will see him just prior to his fishing trip for his injection and then afterwards for operative intervention.    Ninetta Lights, M.D.

## 2014-11-30 NOTE — H&P (Signed)
07-18-14 This is a 69 year-old gentleman who presents to our clinic today with left shoulder pain.  He said about five days ago he was walking on his property when he tripped and fell into a tree hurting his left shoulder.  At that time he heard a pop as well.  This has not gotten any better, in fact it was made worse after shoveling snow over this past weekend.  He describes his pain as a dull throb over the anterior aspect of his shoulder.  He has a fair amount of pain reaching behind his back, not so much with lifting his arm.   Theodore Davis is also here for follow up of his right total knee replacement.  This was done on May 28, 2011.  He has been doing extremely well without any complaints in regards to his knee. Past medical, social and family history reviewed in detail on the patient questionnaire and signed.  Review of systems: As detailed in HPI.  All others reviewed and are negative.   EXAMINATION: Well-developed, well-nourished male in no acute distress.  Alert and oriented x 3.  Examination of his left shoulder reveals full active range of motion.  He can internally rotate to L5, however this causes a fair amount of pain.  Positive empty can.  Negative drop arm.  He is neurovascularly intact distally.  Examination of his right knee reveals range of motion from 0-130 degrees.  Stable to valgus and varus stress.  He is neurovascularly intact distally.  X-RAYS: Three views of his right knee reveal well seated prosthesis without evidence of subsidence or osteolysis.  Three views of his left shoulder reveals a Type II acromion.  Mild AC arthropathy.  Good glenohumeral space and no superior migration of the humeral head.    IMPRESSION: 1. Status post above surgery, right knee. 2. Rotator cuff bursitis, left shoulder.   PLAN: At this point in time we do not feel as though Theodore Davis has torn his rotator cuff tendon.  I do believe he really just aggravated this when he fell into the tree.  We are  going to proceed with a 1:4 subacromial Corticosteroid injection, as well as Jobe exercises today.  If he is not any better in the next 2-3 weeks he will call and we will get an MRI to further delineate the pathology.  In regards to his right knee he is to follow up with [place] in 12 months time for repeat evaluation and x-ray.    PROCEDURE NOTE: The patient's clinical condition is marked by substantial pain and/or significant functional disability.  Other conservative therapy has not provided relief, is contraindicated, or not appropriate.  There is a reasonable likelihood that injection will significantly improve the patient's pain and/or functional disability. Patient is seated on the exam table.  The left shoulder is prepped with Betadine and alcohol and injected into the subacromial space with 1:4 Depo-Medrol/Marcaine.  Patient tolerated the procedure without difficulty.   Ninetta Lights, M.D.  09-22-14 Theodore Davis comes in for follow up of his left shoulder.  MRI is completed and I looked at this.  He has extensive, at least partial thickness tearing, supraspinatus and subscapularis.  Injection we did lasted for 3-4 days, but not beyond that.  The scan states that they don't see a full thickness tear, but in looking at this he is very close to that at the top of the subscapularis, as well as in the mid portion of the supraspinatus.  Obviously nothing retracted.  DISPOSITION:  I spent 25 minutes with Theodore Davis face-to-face discussing his dilemma.  All of his symptoms began acutely from a traumatic injury occurring earlier this year.  He is getting worse rather than better.  Everything he is telling me is consistent with a functional full thickness cuff tear.  We have talked about trying to wait this out, but I don't think time is going to resolve things.  He has a fishing trip coming up in May.  We are going to see him just prior to that to re-inject him and give him a little comfort for that trip, which  he has had planned for a long time.  Following that we have discussed proceeding with definitive treatment.  Exam under anesthesia, arthroscopy, decompression, with possible cuff repair.  That procedure, risks, benefits, complications and anticipated recovery and rehab thoroughly discussed and outlined.  Paperwork complete.  All questions answered.  We will see him just prior to his fishing trip for his injection and then afterwards for operative intervention.    Ninetta Lights, M.D.

## 2014-11-30 NOTE — Progress Notes (Signed)
Assisted Dr. Crews with left, ultrasound guided, infraclavicular block. Side rails up, monitors on throughout procedure. See vital signs in flow sheet. Tolerated Procedure well. 

## 2014-11-30 NOTE — Anesthesia Procedure Notes (Addendum)
Anesthesia Regional Block:  Interscalene brachial plexus block  Pre-Anesthetic Checklist: ,, timeout performed, Correct Patient, Correct Site, Correct Laterality, Correct Procedure, Correct Position, site marked, Risks and benefits discussed,  Surgical consent,  Pre-op evaluation,  At surgeon's request and post-op pain management  Laterality: Left and Upper  Prep: chloraprep       Needles:  Injection technique: Single-shot  Needle Type: Echogenic Needle     Needle Length: 5cm 5 cm Needle Gauge: 21 and 21 G    Additional Needles:  Procedures: ultrasound guided (picture in chart) Interscalene brachial plexus block Narrative:  Start time: 11/30/2014 7:24 AM End time: 11/30/2014 7:29 AM Injection made incrementally with aspirations every 5 mL.  Performed by: Personally  Anesthesiologist: CREWS, DAVID   Procedure Name: Intubation Performed by: Terrance Mass Pre-anesthesia Checklist: Patient identified, Timeout performed, Emergency Drugs available, Suction available and Patient being monitored Patient Re-evaluated:Patient Re-evaluated prior to inductionOxygen Delivery Method: Circle system utilized Preoxygenation: Pre-oxygenation with 100% oxygen Intubation Type: IV induction Ventilation: Mask ventilation without difficulty Laryngoscope Size: Miller and 2 Grade View: Grade I Tube type: Oral Tube size: 7.0 mm Number of attempts: 1 Airway Equipment and Method: Stylet Placement Confirmation: ETT inserted through vocal cords under direct vision,  breath sounds checked- equal and bilateral and positive ETCO2 Secured at: 22 cm Tube secured with: Tape Dental Injury: Teeth and Oropharynx as per pre-operative assessment

## 2014-11-30 NOTE — Interval H&P Note (Signed)
History and Physical Interval Note:  11/30/2014 7:31 AM  Theodore Davis  has presented today for surgery, with the diagnosis of other articular cartilage disorders, left shoulder, impingement syndrome of left shoulder, strain of muscle(s) and tendon(s) of the rotator cuff of left shoulder, initial encounter, bicipital  The various methods of treatment have been discussed with the patient and family. After consideration of risks, benefits and other options for treatment, the patient has consented to  Procedure(s): LEFT SHOULDER ARTHROSCOPY WITH DISTAL CLAVICAL EXCISION, ACROMIOPLASTY, ROTATOR CUFF REPAIR AND BICEP TENDODESIS (Left) as a surgical intervention .  The patient's history has been reviewed, patient examined, no change in status, stable for surgery.  I have reviewed the patient's chart and labs.  Questions were answered to the patient's satisfaction.     Georgina Krist F

## 2014-12-01 ENCOUNTER — Encounter (HOSPITAL_BASED_OUTPATIENT_CLINIC_OR_DEPARTMENT_OTHER): Payer: Self-pay | Admitting: Orthopedic Surgery

## 2014-12-01 NOTE — Op Note (Signed)
NAME:  NHAT, HEARNE NO.:  [id number]  MEDICAL RECORD NO.:  [id number]  LOCATION:                                FACILITY:  [facility]  PHYSICIAN:  Ninetta Lights, M.D. DATE OF BIRTH:  03-05-1946  DATE OF PROCEDURE:  11/30/2014 DATE OF DISCHARGE:  11/30/2014                              OPERATIVE REPORT   PREOPERATIVE DIAGNOSES:  Left shoulder chronic impingement.  Partial probably full-thickness tear, rotator cuff.  Degenerative labral tears. Partial tearing of long head biceps tendon.  Acromioclavicular arthropathy.  POSTOPERATIVE DIAGNOSES:  Left shoulder chronic impingement.  Partial probably full-thickness tear, rotator cuff.  Degenerative labral tears. Partial tearing of long head biceps tendon.  Acromioclavicular arthropathy with a full-thickness tear of supraspinatus tendon throughout the crescent region.  Partial tearing, subscap.  Marked tearing intra-articular portion biceps.  Circumferential labral tears.  PROCEDURE:  Left shoulder exam under anesthesia, arthroscopy. Debridement of labrum.  Debrided with mobilization of rotator cuff. Release resection intra-articular portion of biceps for auto tenodesis. Bursectomy, acromioplasty, CA ligament release.  Excision of distal clavicle.  Arthroscopic-assisted rotator cuff repair, FiberWire suture x2, SwiveLock anchors x2.  SURGEON:  Ninetta Lights, MD  ASSISTANT:  Elmyra Ricks, PA, present throughout the entire case and necessary for timely completion of procedure.  ANESTHESIA:  General.  BLOOD LOSS:  Minimal.  SPECIMENS:  None.  CULTURES:  None.  COMPLICATIONS:  None.  DRESSINGS:  Soft compressive shoulder immobilizer.  PROCEDURE IN DETAIL:  The patient was brought to the operating room, placed on the operating table in supine position.  After adequate anesthesia had been obtained, shoulder examined.  Full motion and stable shoulder.  Placed in a beach-chair position on the shoulder  positioner, prepped and draped in usual sterile fashion.  Three portals; anterior, posterior, lateral.  Arthroscope introduced, shoulder distended and inspected.  Complex tearing, labrum circumferentially debrided.  Some grade 3 changes in the glenohumeral joint debrided.  Tearing of the biceps of the attachment as well as __________ shoulder.  Intra- articular portion release resected for tenodesis.  Partial tearing of the top was subscap, but no discontinuity.  Debrided.  Full-thickness tear supraspinatus.  This was debrided above and below and mobilized. Tuberosity roughened.  From above, type 2 acromion.  Bursa resected. Acromioplasty to a type 1 acromion releasing CA ligament.  Grade 4 changes AC joint.  Periarticular spurs __________ of clavicle resected. Adequacy of decompression confirmed viewing from all portals.  Cannula placed laterally.  The mobilized cuff was then captured with 2 horizontal mattress sutures, the anterior which I caught the front of the supraspinatus as well as the top edge of the subscap, firmly anchored down the tuberosity with 2 SwiveLock anchors.  Nice firm, watertight closure achieved.  Instruments fully removed.  Portals were closed with nylon.  Sterile compressive dressing applied.  Anesthesia reversed.  Brought to the recovery room.  Tolerated the surgery well. No complications.     Ninetta Lights, M.D.     DFM/MEDQ  D:  11/30/2014  T:  12/01/2014  Job:  [id number]

## 2014-12-08 DIAGNOSIS — S46012D Strain of muscle(s) and tendon(s) of the rotator cuff of left shoulder, subsequent encounter: Secondary | ICD-10-CM | POA: Diagnosis not present

## 2014-12-14 DIAGNOSIS — M6281 Muscle weakness (generalized): Secondary | ICD-10-CM | POA: Diagnosis not present

## 2014-12-14 DIAGNOSIS — S46012D Strain of muscle(s) and tendon(s) of the rotator cuff of left shoulder, subsequent encounter: Secondary | ICD-10-CM | POA: Diagnosis not present

## 2014-12-14 DIAGNOSIS — M25512 Pain in left shoulder: Secondary | ICD-10-CM | POA: Diagnosis not present

## 2014-12-14 DIAGNOSIS — M25612 Stiffness of left shoulder, not elsewhere classified: Secondary | ICD-10-CM | POA: Diagnosis not present

## 2014-12-19 DIAGNOSIS — M6281 Muscle weakness (generalized): Secondary | ICD-10-CM | POA: Diagnosis not present

## 2014-12-19 DIAGNOSIS — S46012D Strain of muscle(s) and tendon(s) of the rotator cuff of left shoulder, subsequent encounter: Secondary | ICD-10-CM | POA: Diagnosis not present

## 2014-12-19 DIAGNOSIS — M25612 Stiffness of left shoulder, not elsewhere classified: Secondary | ICD-10-CM | POA: Diagnosis not present

## 2014-12-19 DIAGNOSIS — M25512 Pain in left shoulder: Secondary | ICD-10-CM | POA: Diagnosis not present

## 2014-12-21 DIAGNOSIS — S46012D Strain of muscle(s) and tendon(s) of the rotator cuff of left shoulder, subsequent encounter: Secondary | ICD-10-CM | POA: Diagnosis not present

## 2014-12-21 DIAGNOSIS — M6281 Muscle weakness (generalized): Secondary | ICD-10-CM | POA: Diagnosis not present

## 2014-12-21 DIAGNOSIS — M25612 Stiffness of left shoulder, not elsewhere classified: Secondary | ICD-10-CM | POA: Diagnosis not present

## 2014-12-21 DIAGNOSIS — M25512 Pain in left shoulder: Secondary | ICD-10-CM | POA: Diagnosis not present

## 2014-12-26 DIAGNOSIS — M25612 Stiffness of left shoulder, not elsewhere classified: Secondary | ICD-10-CM | POA: Diagnosis not present

## 2014-12-26 DIAGNOSIS — M6281 Muscle weakness (generalized): Secondary | ICD-10-CM | POA: Diagnosis not present

## 2014-12-26 DIAGNOSIS — M25512 Pain in left shoulder: Secondary | ICD-10-CM | POA: Diagnosis not present

## 2014-12-26 DIAGNOSIS — S46012D Strain of muscle(s) and tendon(s) of the rotator cuff of left shoulder, subsequent encounter: Secondary | ICD-10-CM | POA: Diagnosis not present

## 2014-12-28 DIAGNOSIS — M25612 Stiffness of left shoulder, not elsewhere classified: Secondary | ICD-10-CM | POA: Diagnosis not present

## 2014-12-28 DIAGNOSIS — M6281 Muscle weakness (generalized): Secondary | ICD-10-CM | POA: Diagnosis not present

## 2014-12-28 DIAGNOSIS — S46012D Strain of muscle(s) and tendon(s) of the rotator cuff of left shoulder, subsequent encounter: Secondary | ICD-10-CM | POA: Diagnosis not present

## 2014-12-28 DIAGNOSIS — M25512 Pain in left shoulder: Secondary | ICD-10-CM | POA: Diagnosis not present

## 2015-01-02 DIAGNOSIS — S46012D Strain of muscle(s) and tendon(s) of the rotator cuff of left shoulder, subsequent encounter: Secondary | ICD-10-CM | POA: Diagnosis not present

## 2015-01-02 DIAGNOSIS — M25512 Pain in left shoulder: Secondary | ICD-10-CM | POA: Diagnosis not present

## 2015-01-02 DIAGNOSIS — M25612 Stiffness of left shoulder, not elsewhere classified: Secondary | ICD-10-CM | POA: Diagnosis not present

## 2015-01-02 DIAGNOSIS — M6281 Muscle weakness (generalized): Secondary | ICD-10-CM | POA: Diagnosis not present

## 2015-01-04 DIAGNOSIS — M25512 Pain in left shoulder: Secondary | ICD-10-CM | POA: Diagnosis not present

## 2015-01-04 DIAGNOSIS — M25612 Stiffness of left shoulder, not elsewhere classified: Secondary | ICD-10-CM | POA: Diagnosis not present

## 2015-01-04 DIAGNOSIS — S46012D Strain of muscle(s) and tendon(s) of the rotator cuff of left shoulder, subsequent encounter: Secondary | ICD-10-CM | POA: Diagnosis not present

## 2015-01-04 DIAGNOSIS — M6281 Muscle weakness (generalized): Secondary | ICD-10-CM | POA: Diagnosis not present

## 2015-01-09 DIAGNOSIS — M6281 Muscle weakness (generalized): Secondary | ICD-10-CM | POA: Diagnosis not present

## 2015-01-09 DIAGNOSIS — M25512 Pain in left shoulder: Secondary | ICD-10-CM | POA: Diagnosis not present

## 2015-01-09 DIAGNOSIS — S46012D Strain of muscle(s) and tendon(s) of the rotator cuff of left shoulder, subsequent encounter: Secondary | ICD-10-CM | POA: Diagnosis not present

## 2015-01-09 DIAGNOSIS — M25612 Stiffness of left shoulder, not elsewhere classified: Secondary | ICD-10-CM | POA: Diagnosis not present

## 2015-01-11 DIAGNOSIS — M25612 Stiffness of left shoulder, not elsewhere classified: Secondary | ICD-10-CM | POA: Diagnosis not present

## 2015-01-11 DIAGNOSIS — S46012D Strain of muscle(s) and tendon(s) of the rotator cuff of left shoulder, subsequent encounter: Secondary | ICD-10-CM | POA: Diagnosis not present

## 2015-01-11 DIAGNOSIS — M25512 Pain in left shoulder: Secondary | ICD-10-CM | POA: Diagnosis not present

## 2015-01-11 DIAGNOSIS — M6281 Muscle weakness (generalized): Secondary | ICD-10-CM | POA: Diagnosis not present

## 2015-01-16 DIAGNOSIS — M6281 Muscle weakness (generalized): Secondary | ICD-10-CM | POA: Diagnosis not present

## 2015-01-16 DIAGNOSIS — S46012D Strain of muscle(s) and tendon(s) of the rotator cuff of left shoulder, subsequent encounter: Secondary | ICD-10-CM | POA: Diagnosis not present

## 2015-01-16 DIAGNOSIS — M25612 Stiffness of left shoulder, not elsewhere classified: Secondary | ICD-10-CM | POA: Diagnosis not present

## 2015-01-16 DIAGNOSIS — M25512 Pain in left shoulder: Secondary | ICD-10-CM | POA: Diagnosis not present

## 2015-01-18 DIAGNOSIS — M25612 Stiffness of left shoulder, not elsewhere classified: Secondary | ICD-10-CM | POA: Diagnosis not present

## 2015-01-18 DIAGNOSIS — M6281 Muscle weakness (generalized): Secondary | ICD-10-CM | POA: Diagnosis not present

## 2015-01-18 DIAGNOSIS — S46012D Strain of muscle(s) and tendon(s) of the rotator cuff of left shoulder, subsequent encounter: Secondary | ICD-10-CM | POA: Diagnosis not present

## 2015-01-18 DIAGNOSIS — M25512 Pain in left shoulder: Secondary | ICD-10-CM | POA: Diagnosis not present

## 2015-01-24 DIAGNOSIS — M25612 Stiffness of left shoulder, not elsewhere classified: Secondary | ICD-10-CM | POA: Diagnosis not present

## 2015-01-24 DIAGNOSIS — M6281 Muscle weakness (generalized): Secondary | ICD-10-CM | POA: Diagnosis not present

## 2015-01-24 DIAGNOSIS — M25512 Pain in left shoulder: Secondary | ICD-10-CM | POA: Diagnosis not present

## 2015-01-24 DIAGNOSIS — S46012D Strain of muscle(s) and tendon(s) of the rotator cuff of left shoulder, subsequent encounter: Secondary | ICD-10-CM | POA: Diagnosis not present

## 2015-01-26 DIAGNOSIS — M6281 Muscle weakness (generalized): Secondary | ICD-10-CM | POA: Diagnosis not present

## 2015-01-26 DIAGNOSIS — M25612 Stiffness of left shoulder, not elsewhere classified: Secondary | ICD-10-CM | POA: Diagnosis not present

## 2015-01-26 DIAGNOSIS — M25512 Pain in left shoulder: Secondary | ICD-10-CM | POA: Diagnosis not present

## 2015-01-26 DIAGNOSIS — S46012D Strain of muscle(s) and tendon(s) of the rotator cuff of left shoulder, subsequent encounter: Secondary | ICD-10-CM | POA: Diagnosis not present

## 2015-01-30 DIAGNOSIS — S46012D Strain of muscle(s) and tendon(s) of the rotator cuff of left shoulder, subsequent encounter: Secondary | ICD-10-CM | POA: Diagnosis not present

## 2015-01-30 DIAGNOSIS — M6281 Muscle weakness (generalized): Secondary | ICD-10-CM | POA: Diagnosis not present

## 2015-01-30 DIAGNOSIS — M25612 Stiffness of left shoulder, not elsewhere classified: Secondary | ICD-10-CM | POA: Diagnosis not present

## 2015-01-30 DIAGNOSIS — M25512 Pain in left shoulder: Secondary | ICD-10-CM | POA: Diagnosis not present

## 2015-02-01 DIAGNOSIS — M25512 Pain in left shoulder: Secondary | ICD-10-CM | POA: Diagnosis not present

## 2015-02-01 DIAGNOSIS — M6281 Muscle weakness (generalized): Secondary | ICD-10-CM | POA: Diagnosis not present

## 2015-02-01 DIAGNOSIS — S46012D Strain of muscle(s) and tendon(s) of the rotator cuff of left shoulder, subsequent encounter: Secondary | ICD-10-CM | POA: Diagnosis not present

## 2015-02-01 DIAGNOSIS — M25612 Stiffness of left shoulder, not elsewhere classified: Secondary | ICD-10-CM | POA: Diagnosis not present

## 2015-02-06 DIAGNOSIS — S46012D Strain of muscle(s) and tendon(s) of the rotator cuff of left shoulder, subsequent encounter: Secondary | ICD-10-CM | POA: Diagnosis not present

## 2015-02-06 DIAGNOSIS — M25612 Stiffness of left shoulder, not elsewhere classified: Secondary | ICD-10-CM | POA: Diagnosis not present

## 2015-02-06 DIAGNOSIS — M25512 Pain in left shoulder: Secondary | ICD-10-CM | POA: Diagnosis not present

## 2015-02-06 DIAGNOSIS — M6281 Muscle weakness (generalized): Secondary | ICD-10-CM | POA: Diagnosis not present

## 2015-02-07 ENCOUNTER — Encounter: Payer: Self-pay | Admitting: Family Medicine

## 2015-02-07 ENCOUNTER — Ambulatory Visit (INDEPENDENT_AMBULATORY_CARE_PROVIDER_SITE_OTHER): Payer: Medicare Other | Admitting: Family Medicine

## 2015-02-07 VITALS — BP 130/74 | HR 80 | Temp 98.3°F | Resp 14 | Ht 69.0 in | Wt 202.0 lb

## 2015-02-07 DIAGNOSIS — T63301A Toxic effect of unspecified spider venom, accidental (unintentional), initial encounter: Secondary | ICD-10-CM | POA: Diagnosis not present

## 2015-02-07 MED ORDER — DOXYCYCLINE HYCLATE 100 MG PO TABS: 100.0000 mg | ORAL_TABLET | Freq: Two times a day (BID) | ORAL | Status: DC

## 2015-02-07 NOTE — Progress Notes (Signed)
Patient ID: Theodore Davis, male   DOB: [birth year], 69 y.o.   MRN: [id number]   Subjective:    Patient ID: Theodore Davis, male    DOB: August 25, 1945, 69 y.o.   MRN: [id number]  Patient presents for Insect Bite  patient here with inside bite to his right medial ankle. He states that he pulled out an old tractor on Saturday and he was mowing tall grass, he does not remember being bitten but that evening he had a red spot on his ankle about the size of a time over the past few days it history of pulled in size he denies any significant pain or itching he has not had any fever no nausea vomiting no abdominal pain no joint pain.   Review Of Systems:  GEN- denies fatigue, fever, weight loss,weakness, recent illness HEENT- denies eye drainage, change in vision, nasal discharge, CVS- denies chest pain, palpitations RESP- denies SOB, cough, wheeze ABD- denies N/V, change in stools, abd pain MSK- denies joint pain, muscle aches, injury Neuro- denies headache, dizziness, syncope, seizure activity       Objective:    BP 130/74 mmHg  Pulse 80  Temp(Src) 98.3 F (36.8 C)  Resp 14  Ht 5\' 9"  (1.753 m)  Wt 202 lb (91.627 kg)  BMI 29.82 kg/m2 GEN- NAD, alert and oriented x3 Skin- 4x3 inches of erythema with central clearing, slighty raised, no pustule, no bite mark seen, NT, no warmth  Ext- no edema, DP 2+        Assessment & Plan:      Problem List Items Addressed This Visit    None    Visit Diagnoses    Spider bite, accidental or unintentional, initial encounter    -  Primary    Concern for possible spider bite, with central clearing tick bite also on differential or other insect. With increasing size will start doxy 100mg  BID, tetanus UTD, advised for any itching can use OTC cortisone, call if this worsens or spreads further        Note: This dictation was prepared with Dragon dictation along with smaller phrase technology. Any transcriptional errors that result from this process  are unintentional.

## 2015-02-07 NOTE — Patient Instructions (Signed)
NO AVS given as system was down Verbal instructions given

## 2015-02-08 DIAGNOSIS — S46012D Strain of muscle(s) and tendon(s) of the rotator cuff of left shoulder, subsequent encounter: Secondary | ICD-10-CM | POA: Diagnosis not present

## 2015-02-08 DIAGNOSIS — M6281 Muscle weakness (generalized): Secondary | ICD-10-CM | POA: Diagnosis not present

## 2015-02-08 DIAGNOSIS — M25512 Pain in left shoulder: Secondary | ICD-10-CM | POA: Diagnosis not present

## 2015-02-08 DIAGNOSIS — M25612 Stiffness of left shoulder, not elsewhere classified: Secondary | ICD-10-CM | POA: Diagnosis not present

## 2015-03-19 ENCOUNTER — Other Ambulatory Visit: Payer: Self-pay | Admitting: Family Medicine

## 2015-03-20 ENCOUNTER — Encounter: Payer: Self-pay | Admitting: Family Medicine

## 2015-03-20 ENCOUNTER — Ambulatory Visit (INDEPENDENT_AMBULATORY_CARE_PROVIDER_SITE_OTHER): Payer: Medicare Other | Admitting: Family Medicine

## 2015-03-20 VITALS — BP 132/78 | HR 78 | Temp 98.2°F | Resp 18 | Ht 69.0 in | Wt 206.0 lb

## 2015-03-20 DIAGNOSIS — J018 Other acute sinusitis: Secondary | ICD-10-CM

## 2015-03-20 MED ORDER — FLUTICASONE PROPIONATE 50 MCG/ACT NA SUSP: 2.0000 | Freq: Every day | NASAL | Status: DC

## 2015-03-20 MED ORDER — CETIRIZINE HCL 10 MG PO TABS: 10.0000 mg | ORAL_TABLET | Freq: Every day | ORAL | Status: DC

## 2015-03-20 NOTE — Progress Notes (Signed)
Subjective:    Patient ID: Theodore Davis, male    DOB: 1946/03/07, 69 y.o.   MRN: [id number]  HPI Patient reports 1-2 weeks of difficulty breathing. The symptoms only occur in the morning. It only occurs when he first wakes up. If he is lying flat or on his side he has a difficult time breathing through his nose. Once he sits up and starts moving around, he feels like his breathing instantly improves. Around that same time he will experience rhinorrhea and improved nasal drainage. Patient states that he never has the symptoms throughout the day when he is distracted. He denies any chest pain. He denies dyspnea on exertion. He denies paroxysmal nocturnal dyspnea. He denies orthopnea when he lies down at night. He has not recently gained weight. He has no edema in his legs. He has no JVD. He is exercising on a daily basis. He vigorously walks 2-3 miles with his wife every day with no shortness of breath, no chest pain, no dyspnea on exertion. Past Medical History  Diagnosis Date  . Lyme disease   . Arthritis 10/03/2010    right sternoclavicular joint  . Hyperlipidemia     hypercholesterolemia  . Heart murmur   . HOH (hard of hearing)     R ear  . Hemangioma of spine     lower lumbar   Past Surgical History  Procedure Laterality Date  . Cholecystectomy    . US echocardiography  08/2010  . Cardiovascular stress test  08/2010  . Tendon repair  2005    R elbow  . Mastoid debridement      R ear  . Hernia repair      R inguinal  . Total knee arthroplasty  05/28/2011    Procedure: TOTAL KNEE ARTHROPLASTY;  Surgeon: Ninetta Lights, MD;  Location: [facility];  Service: Orthopedics;  Laterality: Right;  Right total knee arthroplasty   . Joint replacement Right   . Shoulder arthroscopy with rotator cuff repair and subacromial decompression Left 11/30/2014    Procedure: LEFT SHOULDER ARTHROSCOPY WITH DISTAL CLAVICAL EXCISION, ACROMIOPLASTY, ROTATOR CUFF REPAIR , BICEPS TENOTOMY;  Surgeon: Kathryne Hitch, MD;  Location: [place];  Service: Orthopedics;  Laterality: Left;  . Resection distal clavical Left 11/30/2014    Procedure: RESECTION DISTAL CLAVICAL;  Surgeon: Kathryne Hitch, MD;  Location: [place];  Service: Orthopedics;  Laterality: Left;  . Shoulder arthroscopy with bicepstenotomy Left 11/30/2014    Procedure: SHOULDER ARTHROSCOPY WITH BICEPSTENOTOMY;  Surgeon: Kathryne Hitch, MD;  Location: [facility];  Service: Orthopedics;  Laterality: Left;   Current Outpatient Prescriptions on File Prior to Visit  Medication Sig Dispense Refill  . aspirin 325 MG tablet Take 325 mg by mouth every 6 (six) hours as needed. For chest pain    . magnesium oxide (MAG-OX) 400 MG tablet Take 400 mg by mouth 2 (two) times daily.    . ondansetron (ZOFRAN) 4 MG tablet Take 1 tablet (4 mg total) by mouth every 8 (eight) hours as needed for nausea or vomiting. 40 tablet 0  . oxyCODONE-acetaminophen (ROXICET) 5-325 MG per tablet Take 1-2 tablets by mouth every 4 (four) hours as needed. 60 tablet 0  . RAPAFLO 8 MG CAPS capsule Take 8 mg by mouth daily as needed.   3  . simvastatin (ZOCOR) 20 MG tablet TAKE 1 TABLET NIGHTLY AT BEDTIME 90 tablet 1   No current facility-administered medications on file prior to visit.  Allergies  Allergen Reactions  . Codeine Other (See Comments)    Unknown.  . Lipitor [Atorvastatin Calcium] Other (See Comments)    myalgias  . Sudafed [Pseudoephedrine Hcl] Other (See Comments)    unknown   Social History   Social History  . Marital Status: Married    Spouse Name: N/A  . Number of Children: N/A  . Years of Education: N/A   Occupational History  . Not on file.   Social History Main Topics  . Smoking status: Former Smoker    Quit date: 07/12/1998  . Smokeless tobacco: Not on file  . Alcohol Use: No  . Drug Use: No  . Sexual Activity: Yes   Other Topics Concern  . Not on file   Social History Narrative       Review of Systems  All other systems reviewed and are negative.      Objective:   Physical Exam  Constitutional: He appears well-developed and well-nourished. No distress.  HENT:  Right Ear: Tympanic membrane, external ear and ear canal normal.  Left Ear: Tympanic membrane, external ear and ear canal normal.  Nose: Mucosal edema and rhinorrhea present. Right sinus exhibits no maxillary sinus tenderness and no frontal sinus tenderness. Left sinus exhibits no maxillary sinus tenderness and no frontal sinus tenderness.  Mouth/Throat: Oropharynx is clear and moist. No oropharyngeal exudate.  Neck: Neck supple. No JVD present. No thyromegaly present.  Cardiovascular: Normal rate, regular rhythm and normal heart sounds.   No murmur heard. Pulmonary/Chest: Effort normal and breath sounds normal. No respiratory distress. He has no wheezes. He has no rales.  Musculoskeletal: He exhibits no edema.  Lymphadenopathy:    He has no cervical adenopathy.  Skin: He is not diaphoretic.  Vitals reviewed.         Assessment & Plan:  Other acute sinusitis - Plan: fluticasone (FLONASE) 50 MCG/ACT nasal spray, cetirizine (ZYRTEC) 10 MG tablet  Patient symptoms are unusual. It could be sinus congestion that developed throughout the night due to allergies. Perhaps he is exposed to mold in the HVAC system at his home. Perhaps it is other seasonal allergens that have a sinuses inflamed. He does report rhinorrhea and sinus congestion that improves once he sits up in the morning. The other possibility would be anxiety. Symptoms do not seem to occur during the day when he is distracted or at night when he lies down. I see no evidence of congestive heart failure or other cardiac problems today on examination. Therefore I will try Flonase 2 sprays each nostril daily at night and Zyrtec 10 mg by mouth daily at night and see if his sinus congestion improves. Recheck in one week if no better or sooner if worse  or if it changes

## 2015-05-07 ENCOUNTER — Ambulatory Visit
Admission: RE | Admit: 2015-05-07 | Discharge: 2015-05-07 | Disposition: A | Discharge status: Home / Self Care | Payer: Managed Care, Other (non HMO) | Source: Ambulatory Visit | Attending: Cardiovascular Disease | Admitting: Cardiovascular Disease

## 2015-05-07 ENCOUNTER — Ambulatory Visit (INDEPENDENT_AMBULATORY_CARE_PROVIDER_SITE_OTHER): Payer: Managed Care, Other (non HMO) | Admitting: Cardiovascular Disease

## 2015-05-07 VITALS — BP 128/80 | HR 55 | Ht 69.5 in | Wt 204.4 lb

## 2015-05-07 DIAGNOSIS — E785 Hyperlipidemia, unspecified: Secondary | ICD-10-CM

## 2015-05-07 DIAGNOSIS — R0602 Shortness of breath: Secondary | ICD-10-CM | POA: Diagnosis not present

## 2015-05-07 DIAGNOSIS — R34 Anuria and oliguria: Secondary | ICD-10-CM

## 2015-05-07 DIAGNOSIS — R0600 Dyspnea, unspecified: Secondary | ICD-10-CM

## 2015-05-07 DIAGNOSIS — Z79899 Other long term (current) drug therapy: Secondary | ICD-10-CM | POA: Diagnosis not present

## 2015-05-07 DIAGNOSIS — Z889 Allergy status to unspecified drugs, medicaments and biological substances status: Secondary | ICD-10-CM

## 2015-05-07 LAB — LIPID PANEL
CHOLESTEROL: 164 mg/dL (ref 125–200)
HDL: 56 mg/dL (ref >=40)
LDL Cholesterol: 92 mg/dL (ref <130)
TRIGLYCERIDES: 80 mg/dL (ref <150)
Total CHOL/HDL Ratio: 2.9 Ratio (ref <=5.0)
VLDL: 16 mg/dL (ref <30)

## 2015-05-07 LAB — CBC
HCT: 43.8 % (ref 39.0–52.0)
Hemoglobin: 15 g/dL (ref 13.0–17.0)
MCH: 30.2 pg (ref 26.0–34.0)
MCHC: 34.2 g/dL (ref 30.0–36.0)
MCV: 88.1 fL (ref 78.0–100.0)
MPV: 10.6 fL (ref 8.6–12.4)
PLATELETS: 254 10*3/uL (ref 150–400)
RBC: 4.97 MIL/uL (ref 4.22–5.81)
RDW: 13.1 % (ref 11.5–15.5)
WBC: 6 10*3/uL (ref 4.0–10.5)

## 2015-05-07 LAB — COMPREHENSIVE METABOLIC PANEL
ALBUMIN: 3.8 g/dL (ref 3.6–5.1)
ALK PHOS: 67 U/L (ref 40–115)
ALT: 18 U/L (ref 9–46)
AST: 20 U/L (ref 10–35)
BILIRUBIN TOTAL: 1.4 mg/dL — AB (ref 0.2–1.2)
BUN: 12 mg/dL (ref 7–25)
CO2: 25 mmol/L (ref 20–31)
CREATININE: 0.89 mg/dL (ref 0.70–1.25)
Calcium: 8.9 mg/dL (ref 8.6–10.3)
Chloride: 105 mmol/L (ref 98–110)
Glucose, Bld: 106 mg/dL — ABNORMAL HIGH (ref 65–99)
Potassium: 4.4 mmol/L (ref 3.5–5.3)
SODIUM: 140 mmol/L (ref 135–146)
TOTAL PROTEIN: 6.4 g/dL (ref 6.1–8.1)

## 2015-05-07 LAB — TSH: TSH: 0.349 u[IU]/mL — AB (ref 0.350–4.500)

## 2015-05-07 NOTE — Patient Instructions (Signed)
Medication Instructions:  DECREASE Aspirin to 81 mg - take 1 tablet by mouth daily.  Labwork: Your physician recommends that you return for lab work TODAY - FASTING.  Testing/Procedures: Your physician has requested that you have an echocardiogram. Echocardiography is a painless test that uses sound waves to create images of your heart. It provides your doctor with information about the size and shape of your heart and how well your heart's chambers and valves are working. This procedure takes approximately one hour. There are no restrictions for this procedure.  Your physician has requested that you have a chest x-ray. A chest x-ray takes a picture of the organs and structures inside the chest, including the heart, lungs, and blood vessels. This test can show several things, including, whether the heart is enlarges; whether fluid is building up in the lungs; and whether pacemaker / defibrillator leads are still in place.  Follow-Up: Dr Claiborne Billings recommends that you schedule a follow-up appointment after you have completed these tests.  If you need a refill on your cardiac medications before your next appointment, please call your pharmacy.

## 2015-05-08 LAB — PSA: PSA: 0.45 ng/mL (ref <=4.00)

## 2015-05-08 LAB — BRAIN NATRIURETIC PEPTIDE: BRAIN NATRIURETIC PEPTIDE: 21.7 pg/mL (ref 0.0–100.0)

## 2015-05-09 ENCOUNTER — Encounter: Payer: Self-pay | Admitting: *Deleted

## 2015-05-09 ENCOUNTER — Ambulatory Visit (INDEPENDENT_AMBULATORY_CARE_PROVIDER_SITE_OTHER): Payer: 59 | Admitting: Physician Assistant

## 2015-05-09 ENCOUNTER — Encounter: Payer: Self-pay | Admitting: Physician Assistant

## 2015-05-09 ENCOUNTER — Encounter: Payer: Self-pay | Admitting: Cardiovascular Disease

## 2015-05-09 VITALS — BP 110/64 | HR 64 | Temp 98.4°F | Resp 18 | Wt 207.0 lb

## 2015-05-09 DIAGNOSIS — J019 Acute sinusitis, unspecified: Secondary | ICD-10-CM | POA: Diagnosis not present

## 2015-05-09 DIAGNOSIS — Z889 Allergy status to unspecified drugs, medicaments and biological substances status: Secondary | ICD-10-CM | POA: Insufficient documentation

## 2015-05-09 DIAGNOSIS — R0602 Shortness of breath: Secondary | ICD-10-CM | POA: Insufficient documentation

## 2015-05-09 DIAGNOSIS — E785 Hyperlipidemia, unspecified: Secondary | ICD-10-CM | POA: Insufficient documentation

## 2015-05-09 MED ORDER — METHYLPREDNISOLONE ACETATE 80 MG/ML IJ SUSP
80.0000 mg | Freq: Once | INTRAMUSCULAR | Status: AC
  Administered 2015-05-09: 80 mg via INTRAMUSCULAR

## 2015-05-09 NOTE — Progress Notes (Signed)
Patient ID: Theodore Davis, male   DOB: 1945-12-28, 69 y.o.   MRN: [id number]   Primary M.D.: Dr. Margaretmary Eddy  HPI: Theodore Davis is a 69 y.o. male who presents to the office today for a cardiology evaluation.  He is referred through the courtesy of Dr. Dennard Schaumann.  I had seen him remotely and last saw him 5 1/2 yrears ago in April 2012.  [name] has a family history for coronary artery disease in both his mother and father.  He also has a history of hyperlipidemia.  I had seen him in 2012 and at that time.  An echo Doppler study was essentially normal.  A nuclear perfusion study revealed normal perfusion and function.  Post-rest ejection fraction was 68%.  Recently, the patient has had episodes of shortness of breath with activity.  Oftentimes his shortness of breath is worse when he wakes up.  He denies any definitive chest pain.  He does admit to some increased anxiety.  He also has had issues with leg cramps but has been taking supplemental magnesium with benefit.  Recently, he was put on Rapaflo by his urologist to improve urine flow.  This has resulted in increased episodes of shortness of breath.  He is now referred for cardiology evaluation.  He denies presyncope or syncope.  He is unaware of palpitations.   Past Medical History  Diagnosis Date  . Lyme disease   . Arthritis 10/03/2010    right sternoclavicular joint  . Hyperlipidemia     hypercholesterolemia  . Heart murmur   . HOH (hard of hearing)     R ear  . Hemangioma of spine     lower lumbar    Past Surgical History  Procedure Laterality Date  . Cholecystectomy    . US echocardiography  08/2010  . Cardiovascular stress test  08/2010  . Tendon repair  2005    R elbow  . Mastoid debridement      R ear  . Hernia repair      R inguinal  . Total knee arthroplasty  05/28/2011    Procedure: TOTAL KNEE ARTHROPLASTY;  Surgeon: Ninetta Lights, MD;  Location: [place] [facility];  Service: Orthopedics;  Laterality: Right;  Right  total knee arthroplasty   . Joint replacement Right   . Shoulder arthroscopy with rotator cuff repair and subacromial decompression Left 11/30/2014    Procedure: LEFT SHOULDER ARTHROSCOPY WITH DISTAL CLAVICAL EXCISION, ACROMIOPLASTY, ROTATOR CUFF REPAIR , BICEPS TENOTOMY;  Surgeon: Kathryne Hitch, MD;  Location: [facility];  Service: Orthopedics;  Laterality: Left;  . Resection distal clavical Left 11/30/2014    Procedure: RESECTION DISTAL CLAVICAL;  Surgeon: Kathryne Hitch, MD;  Location: [place];  Service: Orthopedics;  Laterality: Left;  . Shoulder arthroscopy with bicepstenotomy Left 11/30/2014    Procedure: SHOULDER ARTHROSCOPY WITH BICEPSTENOTOMY;  Surgeon: Kathryne Hitch, MD;  Location: [facility];  Service: Orthopedics;  Laterality: Left;    Allergies  Allergen Reactions  . Codeine Other (See Comments)    Unknown.  . Lipitor [Atorvastatin Calcium] Other (See Comments)    myalgias  . Sudafed [Pseudoephedrine Hcl] Other (See Comments)    unknown    Current Outpatient Prescriptions  Medication Sig Dispense Refill  . aspirin EC 81 MG tablet Take 81 mg by mouth daily.    . cetirizine (ZYRTEC) 10 MG tablet Take 1 tablet (10 mg total) by mouth daily. 30 tablet 11  . fluticasone (FLONASE) 50 MCG/ACT  nasal spray Place 2 sprays into both nostrils daily. 16 g 6  . magnesium oxide (MAG-OX) 400 MG tablet Take 400 mg by mouth 2 (two) times daily.    Marland Kitchen RAPAFLO 8 MG CAPS capsule Take 8 mg by mouth daily as needed.   3  . simvastatin (ZOCOR) 20 MG tablet TAKE 1 TABLET NIGHTLY AT BEDTIME 90 tablet 1   No current facility-administered medications for this visit.    Social History   Social History  . Marital Status: Married    Spouse Name: N/A  . Number of Children: N/A  . Years of Education: N/A   Occupational History  . Not on file.   Social History Main Topics  . Smoking status: Former Smoker    Quit date: 07/12/1998  . Smokeless tobacco:  Not on file  . Alcohol Use: No  . Drug Use: No  . Sexual Activity: Yes   Other Topics Concern  . Not on file   Social History Narrative   Additional social history is notable that he is retired from  [employer].  He is married for 37 years and has 2 children, ages 89 and 43.  He has smoked for 25-30 years but quit 17 years ago.  He exercises occasionally only 4-5 times per month.  Family History  Problem Relation Age of Onset  . Heart disease Mother   . Hypertension Mother   . Heart disease Father   . Arthritis Father     rheumatoid  . Cancer Father     Bladder   Additional family history is notable in that his mother died with a myocardial infarction but had a history of atrial fibrillation.  Father also had valve disease and died in 08/10/12.  ROS General: Negative; No fevers, chills, or night sweats HEENT: Negative; No changes in vision or hearing, sinus congestion, difficulty swallowing Pulmonary: Positive for seasonal allergies; Negative; No cough, wheezing, shortness of breath, hemoptysis Cardiovascular: See HPI: No chest pain, presyncope, syncope, palpatations GI: Negative; No nausea, vomiting, diarrhea, or abdominal pain GU: Negative; No dysuria, hematuria, or difficulty voiding Musculoskeletal: Negative; no myalgias, joint pain, or weakness Hematologic: Negative; no easy bruising, bleeding Endocrine: Negative; no heat/cold intolerance; no diabetes, Neuro: Negative; no changes in balance, headaches Skin: Negative; No rashes or skin lesions Psychiatric: Positive for anxiety Sleep: Negative; No snoring,  daytime sleepiness, hypersomnolence, bruxism, restless legs, hypnogognic hallucinations. Other comprehensive 14 point system review is negative   Physical Exam BP 128/80 mmHg  Pulse 55  Ht 5' 9.5" (1.765 m)  Wt 204 lb 6.4 oz (92.715 kg)  BMI 29.76 kg/m2 Wt Readings from Last 3 Encounters:  05/09/15 207 lb (93.895 kg)  05/07/15 204 lb 6.4 oz (92.715 kg)  03/20/15  206 lb (93.441 kg)   General: Alert, oriented, no distress.  Skin: normal turgor, no rashes, warm and dry HEENT: Normocephalic, atraumatic. Pupils equal round and reactive to light; sclera anicteric; extraocular muscles intact, No lid lag; Nose without nasal septal hypertrophy; Mouth/Parynx benign; Mallinpatti scale 2 Neck: No JVD, no carotid bruits; normal carotid upstroke Lungs: clear to ausculatation and percussion bilaterally; no wheezing or rales, normal inspiratory and expiratory effort Chest wall: without tenderness to palpitation Heart: PMI not displaced, RRR, s1 s2 normal, 1/6 systolic murmur, No diastolic murmur, no rubs, gallops, thrills, or heaves Abdomen: soft, nontender; no hepatosplenomehaly, BS+; abdominal aorta nontender and not dilated by palpation. Back: no CVA tenderness Pulses: 2+  Musculoskeletal: full range of motion, normal strength, no  joint deformities Extremities: Pulses 2+, no clubbing cyanosis or edema, Homan's sign negative  Neurologic: grossly nonfocal; Cranial nerves grossly wnl Psychologic: Normal mood and affect   ECG (independently read by me): Sinus bradycardia 55 bpm.  Inferior infarct by ECG with Q waves in 3 and aVF.  LABS:  BMP Latest Ref Rng 05/07/2015 10/24/2014 10/18/2013  Glucose 65 - 99 mg/dL 106(H) 105(H) 109(H)  BUN 7 - 25 mg/dL _0 Creatinine 0.70 - 1.25 mg/dL 0.89 0.95 0.91  Sodium 135 - 146 mmol/L 140 138 139  Potassium 3.5 - 5.3 mmol/L 4.4 4.6 4.5  Chloride 98 - 110 mmol/L 105 105 105  CO2 20 - 31 mmol/L _1 Calcium 8.6 - 10.3 mg/dL 8.9 8.5 8.7     Hepatic Function Latest Ref Rng 05/07/2015 10/24/2014 10/18/2013  Total Protein 6.1 - 8.1 g/dL 6.4 6.6 6.5  Albumin 3.6 - 5.1 g/dL 3.8 4.1 4.2  AST 10 - 35 U/L _2 ALT 9 - 46 U/L _3 Alk Phosphatase 40 - 115 U/L 67 62 69  Total Bilirubin 0.2 - 1.2 mg/dL 1.4(H) 1.2 1.0  Bilirubin, Direct 0.0 - 0.3 mg/dL - - -    CBC Latest Ref Rng 05/07/2015 11/30/2014 10/24/2014    WBC 4.0 - 10.5 K/uL 6.0 - 5.5  Hemoglobin 13.0 - 17.0 g/dL 15.0 14.5 15.4  Hematocrit 39.0 - 52.0 % 43.8 - 45.4  Platelets 150 - 400 K/uL 254 - 229   Lab Results  Component Value Date   MCV 88.1 05/07/2015   MCV 89.4 10/24/2014   MCV 87.7 10/18/2013    Lab Results  Component Value Date   TSH 0.349* 05/07/2015    BNP No results found for: BNP  ProBNP No results found for: PROBNP   Lipid Panel     Component Value Date/Time   CHOL 164 05/07/2015 1131   TRIG 80 05/07/2015 1131   HDL 56 05/07/2015 1131   CHOLHDL 2.9 05/07/2015 1131   VLDL 16 05/07/2015 1131   LDLCALC 92 05/07/2015 1131     RADIOLOGY: Dg Chest 2 View  05/07/2015  CLINICAL DATA:  Four months of shortness of breath, former smoker. EXAM: CHEST  2 VIEW COMPARISON:  PA and lateral chest x-ray of November 23, 2013 FINDINGS: The lungs are adequately inflated. There is increased density in the right infrahilar region which has not significantly changed since the previous study. There is no pleural effusion. The heart and pulmonary vascularity are normal. The mediastinum is normal in width. The bony thorax exhibits no acute abnormality. IMPRESSION: Stable right infrahilar prominence since a November 2012. There is no acute cardiopulmonary abnormality. If the patient's symptoms persist and remain unexplained, CT scanning of the chest would be a useful next imaging step. Electronically Signed   By: David  Martinique M.D.   On: 05/07/2015 15:19      ASSESSMENT AND PLAN: [name] is a 69 year old Caucasian male who has a history of hyperlipidemia and has been maintained on simvastatin 20 mg daily.  There is family history for heart disease.  In 2012, he had normal echo Doppler study and normal myocardial perfusion.  His ECG today suggest possible, age indeterminant IMI, which the patient cannot recall any episode.  He has noticed some mild shortness of breath since instituting Rapaflo by his urologist.  His blood pressure  today is controlled.  He is overweight but not obese with physical exam.  He is fasting  today and complete set of laboratory was obtained.  I will contact him regarding the results.  With his shortness of breath, I am recommending an echo Doppler study to evaluate systolic and diastolic function, wall motion with his Q waves as well as valvular architecture.  I am also recommending a PA and lateral chest x-ray with his prior tobacco history and shortness of breath.  I recommended he reduce his aspirin to 81 mg.  I will see him in the office in 6-8 weeks for reevaluation and further conditions will made at that time.   Troy Sine, MD, [facility]  05/09/2015 6:46 PM

## 2015-05-09 NOTE — Progress Notes (Signed)
    Patient ID: Theodore Davis MRN: [id number], DOB: 11/22/1945, 69 y.o. Date of Encounter: 05/09/2015, 12:18 PM    Chief Complaint:  Chief Complaint  Patient presents with  . sinus problems x 1 month     HPI: 69 y.o. year old white male presents with above. Says "It's gotten so bad now-----". Says "It's never been this bad in my life".  Says he has been using Nettie pot, Flonase, Zyrtec. Says what does come out is just clear. No thick dark mucus. No chest congestion. No significant sore throat. No fevers or chills. When I go to look in his ears he comments that he has had mastoid surgery on the right ear.     Home Meds:   Outpatient Prescriptions Prior to Visit  Medication Sig Dispense Refill  . aspirin EC 81 MG tablet Take 81 mg by mouth daily.    . cetirizine (ZYRTEC) 10 MG tablet Take 1 tablet (10 mg total) by mouth daily. 30 tablet 11  . fluticasone (FLONASE) 50 MCG/ACT nasal spray Place 2 sprays into both nostrils daily. 16 g 6  . magnesium oxide (MAG-OX) 400 MG tablet Take 400 mg by mouth 2 (two) times daily.    Marland Kitchen RAPAFLO 8 MG CAPS capsule Take 8 mg by mouth daily as needed.   3  . simvastatin (ZOCOR) 20 MG tablet TAKE 1 TABLET NIGHTLY AT BEDTIME 90 tablet 1   No facility-administered medications prior to visit.    Allergies:  Allergies  Allergen Reactions  . Codeine Other (See Comments)    Unknown.  . Lipitor [Atorvastatin Calcium] Other (See Comments)    myalgias  . Sudafed [Pseudoephedrine Hcl] Other (See Comments)    unknown      Review of Systems: See HPI for pertinent ROS. All other ROS negative.    Physical Exam: Blood pressure 110/64, pulse 64, temperature 98.4 F (36.9 C), temperature source Oral, resp. rate 18, weight 207 lb (93.895 kg)., Body mass index is 30.14 kg/(m^2). General:  WNWD WM. Appears in no acute distress. HEENT: Normocephalic, atraumatic, eyes without discharge, sclera non-icteric, nares are without discharge. Bilateral  auditory canals clear, TM's are without perforation. Right TM with scar. Left TM clear, pearly grey and translucent with reflective cone of light.  Oral cavity moist, posterior pharynx without exudate, erythema, peritonsillar abscess. Mild tenderness with percussion of frontal and maxillary sinuses bilaterally.  Neck: Supple. No thyromegaly. No lymphadenopathy. Lungs: Clear bilaterally to auscultation without wheezes, rales, or rhonchi. Breathing is unlabored. Heart: Regular rhythm. No murmurs, rubs, or gallops. Msk:  Strength and tone normal for age. Extremities/Skin: Warm and dry. Neuro: Alert and oriented X 3. Moves all extremities spontaneously. Gait is normal. CNII-XII grossly in tact. Psych:  Responds to questions appropriately with a normal affect.     ASSESSMENT AND PLAN:  69 y.o. year old male with  1. Acute sinusitis, recurrence not specified, unspecified location Will give Depo-Medrol 80 mg IM now. Continue Zyrtec. Follow-up if he starts to have any thick dark mucus or if develops fever or if symptoms do not resolve.   [address] [place], [place], [facility] 05/09/2015 12:18 PM

## 2015-05-15 ENCOUNTER — Other Ambulatory Visit: Payer: Self-pay

## 2015-05-15 ENCOUNTER — Ambulatory Visit (HOSPITAL_COMMUNITY): Payer: Managed Care, Other (non HMO) | Attending: Internal Medicine

## 2015-05-15 DIAGNOSIS — R0602 Shortness of breath: Secondary | ICD-10-CM | POA: Diagnosis not present

## 2015-05-15 DIAGNOSIS — I5189 Other ill-defined heart diseases: Secondary | ICD-10-CM | POA: Diagnosis not present

## 2015-05-15 DIAGNOSIS — I340 Nonrheumatic mitral (valve) insufficiency: Secondary | ICD-10-CM | POA: Diagnosis not present

## 2015-05-15 DIAGNOSIS — E785 Hyperlipidemia, unspecified: Secondary | ICD-10-CM | POA: Insufficient documentation

## 2015-06-05 ENCOUNTER — Encounter: Payer: Self-pay | Admitting: *Deleted

## 2015-07-16 ENCOUNTER — Encounter: Payer: Self-pay | Admitting: Cardiovascular Disease

## 2015-07-16 ENCOUNTER — Ambulatory Visit (INDEPENDENT_AMBULATORY_CARE_PROVIDER_SITE_OTHER): Payer: Medicare Other | Admitting: Cardiovascular Disease

## 2015-07-16 VITALS — BP 118/60 | HR 61 | Ht 69.0 in | Wt 204.0 lb

## 2015-07-16 DIAGNOSIS — E785 Hyperlipidemia, unspecified: Secondary | ICD-10-CM | POA: Diagnosis not present

## 2015-07-16 DIAGNOSIS — R0600 Dyspnea, unspecified: Secondary | ICD-10-CM | POA: Diagnosis not present

## 2015-07-16 DIAGNOSIS — Z8249 Family history of ischemic heart disease and other diseases of the circulatory system: Secondary | ICD-10-CM

## 2015-07-16 NOTE — Patient Instructions (Signed)
Your physician wants you to follow-up in: 1 year or sooner if needed. You will receive a reminder letter in the mail two months in advance. If you don't receive a letter, please call our office to schedule the follow-up appointment.   If you need a refill on your cardiac medications before your next appointment, please call your pharmacy.   

## 2015-07-17 ENCOUNTER — Encounter: Payer: Self-pay | Admitting: Cardiovascular Disease

## 2015-07-17 DIAGNOSIS — Z8249 Family history of ischemic heart disease and other diseases of the circulatory system: Secondary | ICD-10-CM | POA: Insufficient documentation

## 2015-07-17 NOTE — Progress Notes (Signed)
Patient ID: Theodore Davis, male   DOB: Dec 10, 1945, 70 y.o.   MRN: [id number]   Primary M.D.: Dr. Margaretmary Eddy  HPI: [name] is a 70 y.o. male who presents to the office today for a two-month follow-upcardiology evaluation.    Theodore Davis has a strong family history for CAD in both his mother and father.  He also has a history of hyperlipidemia.  I had seen him in 2012 and at that time an echo Doppler study was essentially normal.  A nuclear perfusion study revealed normal perfusion and function.  Post-rest ejection fraction was 68%.   I had not seen him in over 5-1/2 years but saw him 2 months ago when he was referred by Dr. Dennard Schaumann for me to evaluate  episodes of shortness of breath with activity. Oftentimes his shortness of breath is worse when he wakes up.  He denied any definitive chest pain.  He does admit to some increased anxiety.  He also has had issues with leg cramps but has been taking supplemental magnesium with benefit.  Recently, he was put on Rapaflo by his urologist to improve urine flow.  This has resulted in increased episodes of shortness of breath.     since I saw him, he underwent laboratory which revealed a normal BNP at 21.7.  He was not anemic with a hemoglobin of 15 and hematocrit of 43.8. Chemistry panel was normal, although glucose was minimally increased at 106, and bilirubin at 1.4 but other LFTs were normal. PSA was 0.45.  Lipid studies revealed a total cholesterol 164, triglycerides 80, HDL 56, and LDL 92.  TSH was 0.349. A chest x-ray in November showed stable right infrahilar prominence, which was present on a prior chest x-ray of November 2012.  There was no acute cardiopulmonary abnormality.  He underwent an echo Doppler study in 05/15/2015.  This showed an ejection fraction at 55-60%  With normal wall motion.  There was a suggestion of mild grade 1 diastolic dysfunction.  Tissue Doppler was indeterminant.  There was mild thickening of the mitral valve with  trivial mitral regurgitation.   Since I saw him, he has only rarely been taken Rapaflo.  He feels his shortness of breath is significantly improved. He presents for reevaluation.  Past Medical History  Diagnosis Date  . Lyme disease   . Arthritis 10/03/2010    right sternoclavicular joint  . Hyperlipidemia     hypercholesterolemia  . Heart murmur   . HOH (hard of hearing)     R ear  . Hemangioma of spine     lower lumbar    Past Surgical History  Procedure Laterality Date  . Cholecystectomy    . US echocardiography  08/2010  . Cardiovascular stress test  08/2010  . Tendon repair  2005    R elbow  . Mastoid debridement      R ear  . Hernia repair      R inguinal  . Total knee arthroplasty  05/28/2011    Procedure: TOTAL KNEE ARTHROPLASTY;  Surgeon: Ninetta Lights, MD;  Location: [place];  Service: Orthopedics;  Laterality: Right;  Right total knee arthroplasty   . Joint replacement Right   . Shoulder arthroscopy with rotator cuff repair and subacromial decompression Left 11/30/2014    Procedure: LEFT SHOULDER ARTHROSCOPY WITH DISTAL CLAVICAL EXCISION, ACROMIOPLASTY, ROTATOR CUFF REPAIR , BICEPS TENOTOMY;  Surgeon: Kathryne Hitch, MD;  Location: [facility];  Service: Orthopedics;  Laterality: Left;  .  Resection distal clavical Left 11/30/2014    Procedure: RESECTION DISTAL CLAVICAL;  Surgeon: Kathryne Hitch, MD;  Location: [place];  Service: Orthopedics;  Laterality: Left;  . Shoulder arthroscopy with bicepstenotomy Left 11/30/2014    Procedure: SHOULDER ARTHROSCOPY WITH BICEPSTENOTOMY;  Surgeon: Kathryne Hitch, MD;  Location: [place] [facility];  Service: Orthopedics;  Laterality: Left;    Allergies  Allergen Reactions  . Codeine Other (See Comments)    Unknown.  . Lipitor [Atorvastatin Calcium] Other (See Comments)    myalgias  . Sudafed [Pseudoephedrine Hcl] Other (See Comments)    unknown    Current Outpatient Prescriptions  Medication Sig  Dispense Refill  . aspirin EC 81 MG tablet Take 81 mg by mouth daily.    . magnesium oxide (MAG-OX) 400 MG tablet Take 400 mg by mouth 2 (two) times daily.    Marland Kitchen RAPAFLO 8 MG CAPS capsule Take 8 mg by mouth daily as needed (takes once a week).   3  . simvastatin (ZOCOR) 20 MG tablet TAKE 1 TABLET NIGHTLY AT BEDTIME 90 tablet 1   No current facility-administered medications for this visit.    Social History   Social History  . Marital Status: Married    Spouse Name: N/A  . Number of Children: N/A  . Years of Education: N/A   Occupational History  . Not on file.   Social History Main Topics  . Smoking status: Former Smoker    Quit date: 07/12/1998  . Smokeless tobacco: Not on file  . Alcohol Use: No  . Drug Use: No  . Sexual Activity: Yes   Other Topics Concern  . Not on file   Social History Narrative   Additional social history is notable that he is retired from  [employer].  He is married for 37 years and has 2 children, ages 78 and 101.  He has smoked for 25-30 years but quit 17 years ago.  He exercises occasionally only 4-5 times per month.  Family History  Problem Relation Age of Onset  . Heart disease Mother   . Hypertension Mother   . Heart disease Father   . Arthritis Father     rheumatoid  . Cancer Father     Bladder   Additional family history is notable in that his mother died with a myocardial infarction but had a history of atrial fibrillation.  Father also had valve disease and died in 2012-09-09.  ROS General: Negative; No fevers, chills, or night sweats HEENT: Negative; No changes in vision or hearing, sinus congestion, difficulty swallowing Pulmonary: Positive for seasonal allergies; Negative; No cough, wheezing, shortness of breath, hemoptysis Cardiovascular: See HPI:  GI: Negative; No nausea, vomiting, diarrhea, or abdominal pain GU: Negative; No dysuria, hematuria, or difficulty voiding Musculoskeletal: Negative; no myalgias, joint pain, or  weakness Hematologic: Negative; no easy bruising, bleeding Endocrine: Negative; no heat/cold intolerance; no diabetes, Neuro: Negative; no changes in balance, headaches Skin: Negative; No rashes or skin lesions Psychiatric: Positive for anxiety Sleep: Negative; No snoring,  daytime sleepiness, hypersomnolence, bruxism, restless legs, hypnogognic hallucinations. Other comprehensive 14 point system review is negative   Physical Exam BP 118/60 mmHg  Pulse 61  Ht '5\' 9"'$  (1.753 m)  Wt 204 lb (92.534 kg)  BMI 30.11 kg/m2   Wt Readings from Last 3 Encounters:  07/16/15 204 lb (92.534 kg)  05/09/15 207 lb (93.895 kg)  05/07/15 204 lb 6.4 oz (92.715 kg)   General: Alert, oriented, no distress.  Skin: normal turgor, no rashes, warm and dry HEENT: Normocephalic, atraumatic. Pupils equal round and reactive to light; sclera anicteric; extraocular muscles intact, No lid lag; Nose without nasal septal hypertrophy; Mouth/Parynx benign; Mallinpatti scale 2 Neck: No JVD, no carotid bruits; normal carotid upstroke Lungs: clear to ausculatation and percussion bilaterally; no wheezing or rales, normal inspiratory and expiratory effort Chest wall: without tenderness to palpitation Heart: PMI not displaced, RRR, s1 s2 normal, 1/6 systolic murmur, No diastolic murmur, no rubs, gallops, thrills, or heaves Abdomen: soft, nontender; no hepatosplenomehaly, BS+; abdominal aorta nontender and not dilated by palpation. Back: no CVA tenderness Pulses: 2+  Musculoskeletal: full range of motion, normal strength, no joint deformities Extremities: Pulses 2+, no clubbing cyanosis or edema, Homan's sign negative  Neurologic: grossly nonfocal; Cranial nerves grossly wnl Psychologic: Normal mood and affect  ECG (independently read by me):  Normal sinus rhythm at 61 bpm.  No ectopy.  Normal intervals.  November 2016 ECG (independently read by me): Sinus bradycardia 55 bpm.  Inferior infarct by ECG with Q waves in 3 and  aVF.  LABS:  BMP Latest Ref Rng 05/07/2015 10/24/2014 10/18/2013  Glucose 65 - 99 mg/dL 106(H) 105(H) 109(H)  BUN 7 - 25 mg/dL '12 14 14  '$ Creatinine 0.70 - 1.25 mg/dL 0.89 0.95 0.91  Sodium 135 - 146 mmol/L 140 138 139  Potassium 3.5 - 5.3 mmol/L 4.4 4.6 4.5  Chloride 98 - 110 mmol/L 105 105 105  CO2 20 - 31 mmol/L '25 24 25  '$ Calcium 8.6 - 10.3 mg/dL 8.9 8.5 8.7    Hepatic Function Latest Ref Rng 05/07/2015 10/24/2014 10/18/2013  Total Protein 6.1 - 8.1 g/dL 6.4 6.6 6.5  Albumin 3.6 - 5.1 g/dL 3.8 4.1 4.2  AST 10 - 35 U/L '20 20 21  '$ ALT 9 - 46 U/L '18 22 21  '$ Alk Phosphatase 40 - 115 U/L 67 62 69  Total Bilirubin 0.2 - 1.2 mg/dL 1.4(H) 1.2 1.0  Bilirubin, Direct 0.0 - 0.3 mg/dL - - -    CBC Latest Ref Rng 05/07/2015 11/30/2014 10/24/2014  WBC 4.0 - 10.5 K/uL 6.0 - 5.5  Hemoglobin 13.0 - 17.0 g/dL 15.0 14.5 15.4  Hematocrit 39.0 - 52.0 % 43.8 - 45.4  Platelets 150 - 400 K/uL 254 - 229   Lab Results  Component Value Date   MCV 88.1 05/07/2015   MCV 89.4 10/24/2014   MCV 87.7 10/18/2013    Lab Results  Component Value Date   TSH 0.349* 05/07/2015    BNP No results found for: BNP  ProBNP No results found for: PROBNP   Lipid Panel     Component Value Date/Time   CHOL 164 05/07/2015 1131   TRIG 80 05/07/2015 1131   HDL 56 05/07/2015 1131   CHOLHDL 2.9 05/07/2015 1131   VLDL 16 05/07/2015 1131   LDLCALC 92 05/07/2015 1131     RADIOLOGY: No results found.    ASSESSMENT AND PLAN: Mr. Brenden Rudman is a 70 year old Caucasian male who has a family history of  CAD.  He has hyperlipidemia and has been maintained on simvastatin 20 mg daily.  In 2012, he had normal echo Doppler study and normal myocardial perfusion.   His blood pressure today is well controlled at 118/60.  I reviewed his echo Doppler with him in detail.  This reveals normal systolic function and suggested mild grade 1 diastolic dysfunction.  Is possible that this may be contributing somewhat to his previous  shortness of breath.  His  chest x-ray did not reveal any acute active disease and showed stable prominence at the right hilum.  He is not having any wheezing.  His shortness of breath have improved with his reduction in taking Rapaflo.  He continues to take aspirin 81 mg.  There is no bleeding. I reviewed his laboratory with him in detail. His ECG remained stable.  As long as he remains stable, I will see him in one year for reevaluation.   Troy Sine, MD, [facility]  07/17/2015 6:56 PM

## 2015-07-23 DIAGNOSIS — Z125 Encounter for screening for malignant neoplasm of prostate: Secondary | ICD-10-CM | POA: Diagnosis not present

## 2015-07-24 DIAGNOSIS — H2513 Age-related nuclear cataract, bilateral: Secondary | ICD-10-CM | POA: Diagnosis not present

## 2015-07-24 DIAGNOSIS — H5203 Hypermetropia, bilateral: Secondary | ICD-10-CM | POA: Diagnosis not present

## 2015-07-24 DIAGNOSIS — H524 Presbyopia: Secondary | ICD-10-CM | POA: Diagnosis not present

## 2015-07-31 DIAGNOSIS — Z0000 Encounter for general adult medical examination without abnormal findings: Secondary | ICD-10-CM | POA: Diagnosis not present

## 2015-07-31 DIAGNOSIS — R3912 Poor urinary stream: Secondary | ICD-10-CM | POA: Diagnosis not present

## 2015-08-14 ENCOUNTER — Encounter: Payer: Self-pay | Admitting: Family Medicine

## 2015-08-14 ENCOUNTER — Ambulatory Visit (INDEPENDENT_AMBULATORY_CARE_PROVIDER_SITE_OTHER): Payer: Medicare Other | Admitting: Family Medicine

## 2015-08-14 VITALS — BP 100/64 | HR 80 | Temp 97.9°F | Resp 16 | Ht 69.0 in | Wt 205.0 lb

## 2015-08-14 DIAGNOSIS — J018 Other acute sinusitis: Secondary | ICD-10-CM

## 2015-08-14 DIAGNOSIS — J208 Acute bronchitis due to other specified organisms: Secondary | ICD-10-CM

## 2015-08-14 MED ORDER — ALBUTEROL SULFATE HFA 108 (90 BASE) MCG/ACT IN AERS: 2.0000 | INHALATION_SPRAY | Freq: Four times a day (QID) | RESPIRATORY_TRACT | Status: DC | PRN

## 2015-08-14 MED ORDER — LEVOFLOXACIN 500 MG PO TABS: 500.0000 mg | ORAL_TABLET | Freq: Every day | ORAL | Status: DC

## 2015-08-14 NOTE — Progress Notes (Signed)
Subjective:    Patient ID: Theodore Davis, male    DOB: 08/19/1945, 70 y.o.   MRN: [id number]  HPI  symptoms began approximately 1 week ago. Symptoms began with head congestion and sinus pressure. He reports constant postnasal drip, rhinorrhea, head congestion. He reports a headache in his frontal sinuses. He reports a sore scratchy throat from the constant congestion. He is also developed a cough productive of yellow sputum and shortness of breath. On examination today he has noticeable bilateral expiratory wheezes in all 4 lung fields. He also has audible chest congestion. Past Medical History  Diagnosis Date  . Lyme disease   . Arthritis 10/03/2010    right sternoclavicular joint  . Hyperlipidemia     hypercholesterolemia  . Heart murmur   . HOH (hard of hearing)     R ear  . Hemangioma of spine     lower lumbar   Past Surgical History  Procedure Laterality Date  . Cholecystectomy    . US echocardiography  08/2010  . Cardiovascular stress test  08/2010  . Tendon repair  2005    R elbow  . Mastoid debridement      R ear  . Hernia repair      R inguinal  . Total knee arthroplasty  05/28/2011    Procedure: TOTAL KNEE ARTHROPLASTY;  Surgeon: Ninetta Lights, MD;  Location: [facility];  Service: Orthopedics;  Laterality: Right;  Right total knee arthroplasty   . Joint replacement Right   . Shoulder arthroscopy with rotator cuff repair and subacromial decompression Left 11/30/2014    Procedure: LEFT SHOULDER ARTHROSCOPY WITH DISTAL CLAVICAL EXCISION, ACROMIOPLASTY, ROTATOR CUFF REPAIR , BICEPS TENOTOMY;  Surgeon: Kathryne Hitch, MD;  Location: [facility];  Service: Orthopedics;  Laterality: Left;  . Resection distal clavical Left 11/30/2014    Procedure: RESECTION DISTAL CLAVICAL;  Surgeon: Kathryne Hitch, MD;  Location: [facility];  Service: Orthopedics;  Laterality: Left;  . Shoulder arthroscopy with bicepstenotomy Left 11/30/2014    Procedure: SHOULDER  ARTHROSCOPY WITH BICEPSTENOTOMY;  Surgeon: Kathryne Hitch, MD;  Location: [facility];  Service: Orthopedics;  Laterality: Left;   Current Outpatient Prescriptions on File Prior to Visit  Medication Sig Dispense Refill  . aspirin EC 81 MG tablet Take 81 mg by mouth daily.    . magnesium oxide (MAG-OX) 400 MG tablet Take 400 mg by mouth 2 (two) times daily.    Marland Kitchen RAPAFLO 8 MG CAPS capsule Take 8 mg by mouth daily as needed (takes once a week).   3  . simvastatin (ZOCOR) 20 MG tablet TAKE 1 TABLET NIGHTLY AT BEDTIME 90 tablet 1   No current facility-administered medications on file prior to visit.   Allergies  Allergen Reactions  . Codeine Other (See Comments)    Unknown.  . Lipitor [Atorvastatin Calcium] Other (See Comments)    myalgias  . Sudafed [Pseudoephedrine Hcl] Other (See Comments)    unknown   Social History   Social History  . Marital Status: Married    Spouse Name: N/A  . Number of Children: N/A  . Years of Education: N/A   Occupational History  . Not on file.   Social History Main Topics  . Smoking status: Former Smoker    Quit date: 07/12/1998  . Smokeless tobacco: Not on file  . Alcohol Use: No  . Drug Use: No  . Sexual Activity: Yes   Other Topics Concern  . Not on file  Social History Narrative      Review of Systems  All other systems reviewed and are negative.      Objective:   Physical Exam  Constitutional: He appears well-developed and well-nourished.  HENT:  Right Ear: Tympanic membrane, external ear and ear canal normal.  Left Ear: Tympanic membrane, external ear and ear canal normal.  Nose: Mucosal edema and rhinorrhea present. Right sinus exhibits maxillary sinus tenderness and frontal sinus tenderness. Left sinus exhibits maxillary sinus tenderness and frontal sinus tenderness.  Mouth/Throat: Oropharynx is clear and moist. No oropharyngeal exudate.  Neck: Neck supple.  Cardiovascular: Normal rate, regular rhythm and  normal heart sounds.   Pulmonary/Chest: He has wheezes.  Lymphadenopathy:    He has no cervical adenopathy.  Vitals reviewed.         Assessment & Plan:  Other acute sinusitis - Plan: levofloxacin (LEVAQUIN) 500 MG tablet  Acute bronchitis due to other specified organisms - Plan: levofloxacin (LEVAQUIN) 500 MG tablet, albuterol (PROVENTIL HFA;VENTOLIN HFA) 108 (90 Base) MCG/ACT inhaler   We'll treat sinusitis/bronchitis with Levaquin 500 mg by mouth daily for 7 days. Gave the patient a prescription for albuterol 2 puffs inhaled every 6 hours as needed for wheezing. If symptoms are not improving consider prednisone particularly if the wheezing worsens.

## 2015-09-21 ENCOUNTER — Other Ambulatory Visit: Payer: Self-pay | Admitting: Family Medicine

## 2015-09-21 NOTE — Telephone Encounter (Signed)
Refill appropriate and filled per protocol. 

## 2015-11-05 ENCOUNTER — Telehealth: Payer: Self-pay | Admitting: Family Medicine

## 2015-11-05 MED ORDER — SIMVASTATIN 20 MG PO TABS: ORAL_TABLET | ORAL | Status: DC

## 2015-11-05 NOTE — Telephone Encounter (Signed)
Medication called/sent to requested pharmacy  

## 2015-11-05 NOTE — Telephone Encounter (Signed)
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

## 2016-01-02 ENCOUNTER — Other Ambulatory Visit: Payer: Self-pay | Admitting: Family Medicine

## 2016-01-02 MED ORDER — RAPAFLO 8 MG PO CAPS: 8.0000 mg | ORAL_CAPSULE | Freq: Every day | ORAL | Status: DC

## 2016-01-02 MED ORDER — SIMVASTATIN 20 MG PO TABS: ORAL_TABLET | ORAL | Status: DC

## 2016-01-02 NOTE — Telephone Encounter (Signed)
Medication called/sent to requested pharmacy  

## 2016-01-08 ENCOUNTER — Inpatient Hospital Stay: Admit: 2016-01-08 | Discharge: 2016-01-08 | Disposition: A | Discharge status: Home / Self Care

## 2016-01-08 ENCOUNTER — Encounter: Admit: 2016-01-08 | Primary: Internal Medicine

## 2016-01-08 DIAGNOSIS — J209 Acute bronchitis, unspecified: Secondary | ICD-10-CM

## 2016-01-08 MED ORDER — PSEUDOEPH-BROMPHEN-DM 30-2-10 MG/5ML PO SYRP
2-30-10 MG/5ML | Freq: Four times a day (QID) | ORAL | 0 refills | Status: DC | PRN
Start: 2016-01-08 — End: 2018-07-30

## 2016-01-08 MED ORDER — DOXYCYCLINE MONOHYDRATE 100 MG PO CAPS
100 MG | ORAL_CAPSULE | Freq: Two times a day (BID) | ORAL | 0 refills | Status: DC
Start: 2016-01-08 — End: 2018-07-02

## 2016-01-08 NOTE — ED Provider Notes (Signed)
HPI: Troy Mcdaniel 70 y.o. malewith a past medical history of   Past Medical History:   Diagnosis Date   ??? Cancer (HCC)     prostate   ??? Hypertension     presents with cough.?? Onset of symptoms reported as gradual, nonstridulous.?? Associated with infection: Upper respiratory infection.?? The symptoms are exacerbated by cough, improved by nothing, and are moderate in intensity. Worse today which is what prompted the visit. Denies chest pain, shortness or breath, post tussive emesis. Does complain of some hemoptysis associated with the cough. Is any recent [place] air [place] trips in the past 4 weeks. Denies any exertional dyspnea, blood thinner use, bilateral leg pain.  ??    Review of Systems:   Pertinent positives and negatives are stated within HPI, all other systems reviewed and are negative.    --------------------------------------------- PAST HISTORY ---------------------------------------------  Past Medical History:  has a past medical history of Cancer (HCC) and Hypertension.    Past Surgical History:  has a past surgical history that includes ostatectomy.    Social History:  reports that he has quit smoking. He does not have any smokeless tobacco history on file. He reports that he drinks about 0.6 oz of alcohol per week  He reports that he does not use illicit drugs.    Family History: family history is not on file.     The patient???s home medications have been reviewed.    Allergies: Review of patient's allergies indicates no known allergies.       ---------------------------------------------------PHYSICAL EXAM--------------------------------------    Constitutional/General: Alert and oriented x3, well appearing, non toxic in NAD  Head: Normocephalic and atraumatic  Eyes: PERRL, EOMI  Mouth: Oropharynx clear, handling secretions, no trismus  Neck: Supple, full ROM, non tender to palpation in the midline, no stridor, no crepitus, no meningeal signs  Pulmonary: Diminished breath sounds bilaterally. Diffuse  rhonchi that clears with cough. Scant expiratory wheezing in the bases that clears with cough. Slightly prolonged expirations. No acute respiratory distress.   Cardiovascular:  Regular rate. Regular rhythm. No murmurs, gallops, or rubs. 2+ distal pulses  Chest: no chest wall tenderness  Abdomen: Soft.  Non tender. Non distended.  Musculoskeletal: Moves all extremities x 4. Warm and well perfused, no clubbing, cyanosis, or edema. Capillary refill <3 seconds  Skin: warm and dry. No rashes.   Neurologic: GCS 15  Psych: Normal Affect      -------------------------------------------------- RESULTS -------------------------------------------------    LABS:  No results found for this visit on 01/08/16.    RADIOLOGY:  Interpreted by Radiologist.  XR Chest Standard TWO VW   Final Result   Normal chest.          ------------------------- NURSING NOTES AND VITALS REVIEWED ---------------------------   The nursing notes within the ED encounter and vital signs as below have been reviewed.   BP 113/71   Pulse 61   Temp 98.3 ??F (36.8 ??C) (Oral)    Resp 16   Wt 215 lb (97.5 kg)   SpO2 95%  Oxygen Saturation Interpretation: Normal    The patient???s available past medical records and past encounters were reviewed.        ------------------------------ ED COURSE/MEDICAL DECISION MAKING----------------------  Medications - No data to display      Medical Decision Making:    This patient's ED course included: a personal history and physicial eaxmination and re-evaluation prior to disposition. Discussed at length the signs and symptoms of pulmonary embolism with the patient. Repeat pulse oximetry  was 96%. Patient has no other findings supporting needing a stat CTA. Patient has had chest x-rays and CAT scans in the past as he has had a pulmonary nodule that was found to be negative. Patient will be treated for bronchitis. He is advised to go the emergency department should his symptoms worsen in anyway.    This patient has remained  hemodynamically stable during their ED course.    Counseling:   The emergency provider has spoken with the patient and discussed today???s results, in addition to providing specific details for the plan of care and counseling regarding the diagnosis and prognosis.  Questions are answered at this time and they are agreeable with the plan.       --------------------------------- IMPRESSION AND DISPOSITION ---------------------------------    IMPRESSION  1. Acute bronchitis, unspecified organism        DISPOSITION  Disposition: Discharge to home  Patient condition is good             Gloris Ham, PA  01/08/16 1153

## 2016-04-23 ENCOUNTER — Encounter: Payer: Self-pay | Admitting: Physician Assistant

## 2016-04-23 ENCOUNTER — Ambulatory Visit (INDEPENDENT_AMBULATORY_CARE_PROVIDER_SITE_OTHER): Payer: Medicare Other | Admitting: Physician Assistant

## 2016-04-23 VITALS — BP 108/72 | HR 73 | Temp 97.4°F | Resp 16 | Ht 69.0 in | Wt 204.0 lb

## 2016-04-23 DIAGNOSIS — J0191 Acute recurrent sinusitis, unspecified: Secondary | ICD-10-CM | POA: Diagnosis not present

## 2016-04-23 DIAGNOSIS — J988 Other specified respiratory disorders: Secondary | ICD-10-CM

## 2016-04-23 DIAGNOSIS — B9689 Other specified bacterial agents as the cause of diseases classified elsewhere: Secondary | ICD-10-CM

## 2016-04-23 MED ORDER — CEFDINIR 300 MG PO CAPS: 300.0000 mg | ORAL_CAPSULE | Freq: Two times a day (BID) | ORAL | 0 refills | Status: DC

## 2016-04-23 MED ORDER — PREDNISONE 20 MG PO TABS: ORAL_TABLET | ORAL | 0 refills | Status: DC

## 2016-04-23 NOTE — Progress Notes (Signed)
Patient ID: Theodore Davis MRN: [id number], DOB: [birth year], 70 y.o. Date of Encounter: 04/23/2016, 11:44 AM    Chief Complaint:  Chief Complaint  Patient presents with  . head cold     HPI: 70 y.o. year old male presents with above.   Says the symptoms have been occurring more than a week now. Says that he has been having a lot of congestion in his head. Also draining down his throat. "Feels like his head is in a jug ". Some runny nose. Says that he "feels awful". Says that he does sometimes cough up phlegm and isn't sure whether it is just from drainage down his throat or whether it's actually in his chest. Has been using nettie pot and Mucinex. No known fevers or chills. No significant sore throat.     Home Meds:   Outpatient Medications Prior to Visit  Medication Sig Dispense Refill  . aspirin EC 81 MG tablet Take 81 mg by mouth daily.    Marland Kitchen levofloxacin (LEVAQUIN) 500 MG tablet Take 1 tablet (500 mg total) by mouth daily. 7 tablet 0  . magnesium oxide (MAG-OX) 400 MG tablet Take 400 mg by mouth 2 (two) times daily.    Marland Kitchen RAPAFLO 8 MG CAPS capsule Take 1 capsule (8 mg total) by mouth daily with breakfast. 90 capsule 3  . simvastatin (ZOCOR) 20 MG tablet TAKE 1 TABLET NIGHTLY AT BEDTIME 90 tablet 3  . albuterol (PROVENTIL HFA;VENTOLIN HFA) 108 (90 Base) MCG/ACT inhaler Inhale 2 puffs into the lungs every 6 (six) hours as needed for wheezing or shortness of breath. (Patient not taking: Reported on 04/23/2016) 1 Inhaler 0   No facility-administered medications prior to visit.     Allergies:  Allergies  Allergen Reactions  . Codeine Other (See Comments)    Unknown.  . Lipitor [Atorvastatin Calcium] Other (See Comments)    myalgias  . Sudafed [Pseudoephedrine Hcl] Other (See Comments)    unknown      Review of Systems: See HPI for pertinent ROS. All other ROS negative.    Physical Exam: Blood pressure 108/72, pulse 73, temperature 97.4 F (36.3 C), temperature source  Oral, resp. rate 16, height 5\' 9"  (1.753 m), weight 204 lb (92.5 kg), SpO2 98 %., Body mass index is 30.13 kg/m. General:  WNWD WM. Very Pleasant.Appears in no acute distress. HEENT: Normocephalic, atraumatic, eyes without discharge, sclera non-icteric, nares are without discharge. Bilateral auditory canals clear, Right TM with areas of white scar secondary to prior surgery. Left TM clear, normal. Oral cavity moist, posterior pharynx without exudate, erythema, peritonsillar abscess.   Neck: Supple. No thyromegaly. No lymphadenopathy. Lungs: Clear bilaterally to auscultation without wheezes, rales, or rhonchi. Breathing is unlabored. Heart: Regular rhythm. No murmurs, rubs, or gallops. Msk:  Strength and tone normal for age. Extremities/Skin: Warm and dry. Neuro: Alert and oriented X 3. Moves all extremities spontaneously. Gait is normal. CNII-XII grossly in tact. Psych:  Responds to questions appropriately with a normal affect.     ASSESSMENT AND PLAN:  70 y.o. year old male with  1. Acute recurrent sinusitis, unspecified location He is to take the [facility] and prednisone as directed. Follow-up if symptoms do not resolve upon completion of these medications. - cefdinir (OMNICEF) 300 MG capsule; Take 1 capsule (300 mg total) by mouth 2 (two) times daily.  Dispense: 20 capsule; Refill: 0 - predniSONE (DELTASONE) 20 MG tablet; Take 3 daily for 2 days, then 2 daily for 2 days, then 1  daily for 2 days.  Dispense: 12 tablet; Refill: 0  2. Bacterial respiratory infection He is to take the [facility] and prednisone as directed. Follow-up if symptoms do not resolve upon completion of these medications. - cefdinir (OMNICEF) 300 MG capsule; Take 1 capsule (300 mg total) by mouth 2 (two) times daily.  Dispense: 20 capsule; Refill: 0   Signed, [address] [place], [place], [facility] 04/23/2016 11:44 AM

## 2016-07-15 ENCOUNTER — Ambulatory Visit: Payer: PRIVATE HEALTH INSURANCE | Admitting: Cardiovascular Disease

## 2016-07-16 ENCOUNTER — Encounter: Payer: Self-pay | Admitting: Cardiovascular Disease

## 2016-07-16 ENCOUNTER — Ambulatory Visit (INDEPENDENT_AMBULATORY_CARE_PROVIDER_SITE_OTHER): Payer: Medicare Other | Admitting: Cardiovascular Disease

## 2016-07-16 VITALS — BP 112/68 | HR 66 | Ht 69.5 in | Wt 196.6 lb

## 2016-07-16 DIAGNOSIS — E785 Hyperlipidemia, unspecified: Secondary | ICD-10-CM | POA: Diagnosis not present

## 2016-07-16 DIAGNOSIS — Z8249 Family history of ischemic heart disease and other diseases of the circulatory system: Secondary | ICD-10-CM | POA: Diagnosis not present

## 2016-07-16 NOTE — Patient Instructions (Signed)
Medication Instructions:  Your physician recommends that you continue on your current medications as directed. Please refer to the Current Medication list given to you today.  Labwork: NONE   Testing/Procedures: NONE   Follow-Up: Your physician wants you to follow-up in: 12 MONTHS WITH DR KELLY. You will receive a reminder letter in the mail two months in advance. If you don't receive a letter, please call our office to schedule the follow-up appointment.  Any Other Special Instructions Will Be Listed Below (If Applicable).     If you need a refill on your cardiac medications before your next appointment, please call your pharmacy.  

## 2016-07-16 NOTE — Progress Notes (Signed)
Patient ID: Theodore Davis, male   DOB: 08/16/1945, 71 y.o.   MRN: [id number]   Primary M.D.: Dr. Margaretmary Eddy  HPI: Theodore Davis is a 71 y.o. male who presents to the office today for a 63-monthfollow-upcardiology evaluation.    Mr. Theodore Brenninghas a strong family history for CAD in both his mother and father.  He also has a history of hyperlipidemia.  I had seen him in 2012 and at that time an echo Doppler study was essentially normal.  A nuclear perfusion study revealed normal perfusion and function.  Post-rest ejection fraction was 68%.   I had not seen him in over 5-1/2 years but saw him 2 months ago when he was referred by Dr. PDennard Schaumannfor me to evaluate  episodes of shortness of breath with activity. Oftentimes his shortness of breath is worse when he wakes up.  He denied any definitive chest pain.  He does admit to some increased anxiety.  He also has had issues with leg cramps but has been taking supplemental magnesium with benefit.  He was put on Rapaflo by his urologist to improve urine flow.  This has resulted in increased episodes of shortness of breath.    In 2016 laboratory revealed a normal BNP at 21.7.  He was not anemic with a hemoglobin of 15 and hematocrit of 43.8. Chemistry panel was normal, although glucose was minimally increased at 106, and bilirubin at 1.4 but other LFTs were normal. PSA was 0.45.  Lipid studies revealed a total cholesterol 164, triglycerides 80, HDL 56, and LDL 92.  TSH was 0.349. A chest x-ray in November showed stable right infrahilar prominence, which was present on a prior chest x-ray of November 2012.  There was no acute cardiopulmonary abnormality.  An echo Doppler study on 05/15/2015 showed an ejection fraction at 55-60% with normal wall motion.  There was a suggestion of mild grade 1 diastolic dysfunction.  Tissue Doppler was indeterminant.  There was mild thickening of the mitral valve with trivial mitral regurgitation.    Over the past year, he has  continued to do well.  He has been retired for many years.  He denies any episodes of chest pressure.  On January 1 18, he joined a gym.  He has been exercising daily and feels significantly improved.  He admits to a 10 pound weight loss.  He denies palpitations.  He denies PND or orthopnea.  He is to undergo blood work with his primary physician next week.  He presents for evaluation    Past Medical History:  Diagnosis Date  . Arthritis 10/03/2010   right sternoclavicular joint  . Heart murmur   . Hemangioma of spine    lower lumbar  . HOH (hard of hearing)    R ear  . Hyperlipidemia    hypercholesterolemia  . Lyme disease     Past Surgical History:  Procedure Laterality Date  . CARDIOVASCULAR STRESS TEST  08/2010  . CHOLECYSTECTOMY    . HERNIA REPAIR     R inguinal  . JOINT REPLACEMENT Right   . MASTOID DEBRIDEMENT     R ear  . RESECTION DISTAL CLAVICAL Left 11/30/2014   Procedure: RESECTION DISTAL CLAVICAL;  Surgeon: DKathryne Hitch MD;  Location[facility]  Service: Orthopedics;  Laterality: Left;  . SHOULDER ARTHROSCOPY WITH BICEPSTENOTOMY Left 11/30/2014   Procedure: SHOULDER ARTHROSCOPY WITH BICEPSTENOTOMY;  Surgeon: DKathryne Hitch MD;  [facility]  Service: Orthopedics;  Laterality: Left;  . SHOULDER ARTHROSCOPY WITH ROTATOR CUFF REPAIR AND SUBACROMIAL DECOMPRESSION Left 11/30/2014   Procedure: LEFT SHOULDER ARTHROSCOPY WITH DISTAL CLAVICAL EXCISION, ACROMIOPLASTY, ROTATOR CUFF REPAIR , BICEPS TENOTOMY;  Surgeon: Kathryne Hitch, MD;  Location: [place];  Service: Orthopedics;  Laterality: Left;  . TENDON REPAIR  08/11/03   R elbow  . TOTAL KNEE ARTHROPLASTY  05/28/2011   Procedure: TOTAL KNEE ARTHROPLASTY;  Surgeon: Ninetta Lights, MD;  Location: [place];  Service: Orthopedics;  Laterality: Right;  Right total knee arthroplasty   . US ECHOCARDIOGRAPHY  08/2010    Allergies  Allergen Reactions  . Codeine Other (See Comments)     Unknown.  . Lipitor [Atorvastatin Calcium] Other (See Comments)    myalgias  . Sudafed [Pseudoephedrine Hcl] Other (See Comments)    unknown    Current Outpatient Prescriptions  Medication Sig Dispense Refill  . aspirin EC 81 MG tablet Take 81 mg by mouth daily.    . magnesium oxide (MAG-OX) 400 MG tablet Take 400 mg by mouth 2 (two) times daily.    Marland Kitchen RAPAFLO 8 MG CAPS capsule Take 1 capsule (8 mg total) by mouth daily with breakfast. 90 capsule 3  . simvastatin (ZOCOR) 20 MG tablet TAKE 1 TABLET NIGHTLY AT BEDTIME 90 tablet 3   No current facility-administered medications for this visit.     Social History   Social History  . Marital status: Married    Spouse name: N/A  . Number of children: N/A  . Years of education: N/A   Occupational History  . Not on file.   Social History Main Topics  . Smoking status: Former Smoker    Quit date: 07/12/1998  . Smokeless tobacco: Never Used  . Alcohol use No  . Drug use: No  . Sexual activity: Yes   Other Topics Concern  . Not on file   Social History Narrative  . No narrative on file   Additional social history is notable that he is retired from  [employer].  He is married for 37 years and has 2 children, ages 27 and 41.  He has smoked for 25-30 years but quit 17 years ago.  He exercises occasionally only 4-5 times per month.  Family History  Problem Relation Age of Onset  . Heart disease Mother   . Hypertension Mother   . Heart disease Father   . Arthritis Father     rheumatoid  . Cancer Father     Bladder   Additional family history is notable in that his mother died with a myocardial infarction but had a history of atrial fibrillation.  Father also had valve disease and died in 10-Aug-2012.  ROS General: Negative; No fevers, chills, or night sweats HEENT: Negative; No changes in vision or hearing, sinus congestion, difficulty swallowing Pulmonary: Positive for seasonal allergies; Negative; No cough, wheezing, shortness  of breath, hemoptysis Cardiovascular: See HPI:  GI: Negative; No nausea, vomiting, diarrhea, or abdominal pain GU: Negative; No dysuria, hematuria, or difficulty voiding Musculoskeletal: Negative; no myalgias, joint pain, or weakness Hematologic: Negative; no easy bruising, bleeding Endocrine: Negative; no heat/cold intolerance; no diabetes, Neuro: Negative; no changes in balance, headaches Skin: Negative; No rashes or skin lesions Psychiatric: Positive for anxiety Sleep: Negative; No snoring,  daytime sleepiness, hypersomnolence, bruxism, restless legs, hypnogognic hallucinations. Other comprehensive 14 point system review is negative   Physical Exam BP 112/68   Pulse 66   Ht 5' 9.5" (1.765 m)  Wt 196 lb 9.6 oz (89.2 kg)   BMI 28.62 kg/m    Wt Readings from Last 3 Encounters:  07/16/16 196 lb 9.6 oz (89.2 kg)  04/23/16 204 lb (92.5 kg)  08/14/15 205 lb (93 kg)   General: Alert, oriented, no distress.  Skin: normal turgor, no rashes, warm and dry HEENT: Normocephalic, atraumatic. Pupils equal round and reactive to light; sclera anicteric; extraocular muscles intact, No lid lag; Nose without nasal septal hypertrophy; Mouth/Parynx benign; Mallinpatti scale 2 Neck: No JVD, no carotid bruits; normal carotid upstroke Lungs: clear to ausculatation and percussion bilaterally; no wheezing or rales, normal inspiratory and expiratory effort Chest wall: without tenderness to palpitation Heart: PMI not displaced, RRR, s1 s2 normal, 1/6 systolic murmur, No diastolic murmur, no rubs, gallops, thrills, or heaves Abdomen: soft, nontender; no hepatosplenomehaly, BS+; abdominal aorta nontender and not dilated by palpation. Back: no CVA tenderness Pulses: 2+  Musculoskeletal: full range of motion, normal strength, no joint deformities Extremities: Pulses 2+, no clubbing cyanosis or edema, Homan's sign negative  Neurologic: grossly nonfocal; Cranial nerves grossly wnl Psychologic: Normal mood  and affect  ECG (independently read by me): Normal sinus rhythm at 68 bpm.  Q wave in lead 3 and aVF.  January 2017 ECG (independently read by me):  Normal sinus rhythm at 61 bpm.  No ectopy.  Normal intervals.  November 2016 ECG (independently read by me): Sinus bradycardia 55 bpm.  Inferior infarct by ECG with Q waves in 3 and aVF.  LABS:  BMP Latest Ref Rng & Units 05/07/2015 10/24/2014 10/18/2013  Glucose 65 - 99 mg/dL 106(H) 105(H) 109(H)  BUN 7 - 25 mg/dL _0 Creatinine 0.70 - 1.25 mg/dL 0.89 0.95 0.91  Sodium 135 - 146 mmol/L 140 138 139  Potassium 3.5 - 5.3 mmol/L 4.4 4.6 4.5  Chloride 98 - 110 mmol/L 105 105 105  CO2 20 - 31 mmol/L _1 Calcium 8.6 - 10.3 mg/dL 8.9 8.5 8.7    Hepatic Function Latest Ref Rng & Units 05/07/2015 10/24/2014 10/18/2013  Total Protein 6.1 - 8.1 g/dL 6.4 6.6 6.5  Albumin 3.6 - 5.1 g/dL 3.8 4.1 4.2  AST 10 - 35 U/L _2 ALT 9 - 46 U/L _3 Alk Phosphatase 40 - 115 U/L 67 62 69  Total Bilirubin 0.2 - 1.2 mg/dL 1.4(H) 1.2 1.0  Bilirubin, Direct 0.0 - 0.3 mg/dL - - -    CBC Latest Ref Rng & Units 05/07/2015 11/30/2014 10/24/2014  WBC 4.0 - 10.5 K/uL 6.0 - 5.5  Hemoglobin 13.0 - 17.0 g/dL 15.0 14.5 15.4  Hematocrit 39.0 - 52.0 % 43.8 - 45.4  Platelets 150 - 400 K/uL 254 - 229   Lab Results  Component Value Date   MCV 88.1 05/07/2015   MCV 89.4 10/24/2014   MCV 87.7 10/18/2013    Lab Results  Component Value Date   TSH 0.349 (L) 05/07/2015    BNP    Component Value Date/Time   BNP 21.7 05/07/2015 1131    ProBNP No results found for: PROBNP   Lipid Panel     Component Value Date/Time   CHOL 164 05/07/2015 1131   TRIG 80 05/07/2015 1131   HDL 56 05/07/2015 1131   CHOLHDL 2.9 05/07/2015 1131   VLDL 16 05/07/2015 1131   LDLCALC 92 05/07/2015 1131     RADIOLOGY: No results found.  IMPRESSION:  1. Family history of heart disease   2. Hyperlipidemia  LDL goal <70     ASSESSMENT AND PLAN: Theodore Davis is a 71 year old Caucasian male who has a family history of  CAD.  He has hyperlipidemia and has been maintained on simvastatin 20 mg daily.  In 2012, he had normal echo Doppler study and normal myocardial perfusion.   His blood pressure today is well controlled.  His last echo Doppler study in November 2016 reveals normal systolic function and suggested mild grade 1 diastolic dysfunction.  He did not have any wall motion abnormality.  His ECG suggests inferior Q waves.  He denies any chest pain.  There are no palpitations.  He continues to be on aspirin 81 mg for antiplatelet benefit.  He continues to be on simvastatin 20 mg for lipid lowering.  Repeat blood work will be obtained next week with his primary physician.  I would recommend a target LDL less than 70, and if he is not at this target dose increase or change to more high potency statin may be necessary.  I am encouraged with his daily exercise.  I will see him in one year for reevaluation or sooner if problems arise.  Troy Sine, MD, [facility]  07/16/2016 6:28 PM

## 2016-07-17 ENCOUNTER — Ambulatory Visit (INDEPENDENT_AMBULATORY_CARE_PROVIDER_SITE_OTHER): Payer: Medicare Other | Admitting: Family Medicine

## 2016-07-17 ENCOUNTER — Encounter: Payer: Self-pay | Admitting: Family Medicine

## 2016-07-17 VITALS — BP 110/66 | HR 68 | Temp 98.3°F | Resp 18 | Wt 199.0 lb

## 2016-07-17 DIAGNOSIS — H9113 Presbycusis, bilateral: Secondary | ICD-10-CM

## 2016-07-17 NOTE — Progress Notes (Signed)
Subjective:    Patient ID: Theodore Davis, male    DOB: 11/13/1945, 71 y.o.   MRN: [id number]  HPI  Patient reports hearing loss in his left ear. He states it feels like something is in his left ear. On examination his left auditory canal is completely clear. The tympanic membrane is pearly gray without erythema and without middle ear effusion. There is no foreign body or cerumen impaction. However he has diminished hearing in his left ear. He is able to hear 500 Hz and 1000 Hz at 40 dB. He is able to hear 1000 Hz at 20 dB. He has lost all hearing to high frequency up to 40 dB. This is consistent with high frequency hearing loss related to age. Patient has been wearing a hearing aid in his right ear for years. 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 He also reports some mild tinnitus in his left ear but he denies vertigo. Symptoms improved when he wears a hearing aid Past Medical History:  Diagnosis Date  . Arthritis 10/03/2010   right sternoclavicular joint  . Heart murmur   . Hemangioma of spine    lower lumbar  . HOH (hard of hearing)    R ear  . Hyperlipidemia    hypercholesterolemia  . Lyme disease    Past Surgical History:  Procedure Laterality Date  . CARDIOVASCULAR STRESS TEST  08/2010  . CHOLECYSTECTOMY    . HERNIA REPAIR     R inguinal  . JOINT REPLACEMENT Right   . MASTOID DEBRIDEMENT     R ear  . RESECTION DISTAL CLAVICAL Left 11/30/2014   Procedure: RESECTION DISTAL CLAVICAL;  Surgeon: Kathryne Hitch, MD;  Location: [place];  Service: Orthopedics;  Laterality: Left;  . SHOULDER ARTHROSCOPY WITH BICEPSTENOTOMY Left 11/30/2014   Procedure: SHOULDER ARTHROSCOPY WITH BICEPSTENOTOMY;  Surgeon: Kathryne Hitch, MD;  Location: [place];  Service: Orthopedics;  Laterality: Left;  . SHOULDER ARTHROSCOPY WITH ROTATOR CUFF REPAIR AND SUBACROMIAL DECOMPRESSION Left 11/30/2014   Procedure: LEFT SHOULDER ARTHROSCOPY WITH DISTAL CLAVICAL EXCISION, ACROMIOPLASTY, ROTATOR CUFF REPAIR , BICEPS TENOTOMY;  Surgeon: Kathryne Hitch, MD;  Location: [place];  Service: Orthopedics;  Laterality: Left;  . TENDON REPAIR  2005   R elbow  . TOTAL KNEE ARTHROPLASTY  05/28/2011   Procedure: TOTAL KNEE ARTHROPLASTY;  Surgeon: Ninetta Lights, MD;  Location: [place];  Service: Orthopedics;  Laterality: Right;  Right total knee arthroplasty   . US ECHOCARDIOGRAPHY  08/2010   Current Outpatient Prescriptions on File Prior to Visit  Medication Sig Dispense Refill  . aspirin EC 81 MG tablet Take 81 mg by mouth daily.    . magnesium oxide (MAG-OX) 400 MG tablet Take 400 mg by mouth 2 (two) times daily.    Marland Kitchen RAPAFLO 8 MG CAPS capsule Take 1 capsule (8 mg total) by mouth daily with breakfast. 90 capsule 3  . simvastatin (ZOCOR) 20 MG tablet TAKE 1 TABLET NIGHTLY AT BEDTIME 90 tablet 3   No current facility-administered medications on file prior to visit.    Allergies  Allergen Reactions  . Codeine Other (See Comments)    Unknown.  . Lipitor [Atorvastatin Calcium] Other (See Comments)    myalgias  . Sudafed [Pseudoephedrine Hcl] Other (See Comments)    unknown   Social History   Social History  . Marital status: Married    Spouse name: N/A  .  Number of children: N/A  . Years of education: N/A   Occupational History  . Not on file.   Social History Main Topics  . Smoking status: Former Smoker    Quit date: 07/12/1998  . Smokeless tobacco: Never Used  . Alcohol use No  . Drug use: No  . Sexual activity: Yes   Other Topics Concern  . Not on file   Social History Narrative  . No narrative on file     Review of Systems  All other systems reviewed and are negative.      Objective:   Physical Exam  HENT:  Right Ear: Tympanic membrane and ear canal normal. No drainage, swelling or tenderness. No foreign bodies. No middle ear effusion. Decreased hearing is  noted.  Left Ear: Tympanic membrane and ear canal normal. No drainage, swelling or tenderness. No foreign bodies. Tympanic membrane is not injected, not scarred, not perforated, not erythematous, not retracted and not bulging. Tympanic membrane mobility is normal.  No middle ear effusion. Decreased hearing is noted.          Assessment & Plan:  Presbycusis of both ears  Patient has presbycusis in both years. I recommended that he follow with his audiologist for evaluation for hearing aid in his left ear. 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

## 2016-08-09 DIAGNOSIS — Z20828 Contact with and (suspected) exposure to other viral communicable diseases: Secondary | ICD-10-CM | POA: Diagnosis not present

## 2016-08-11 ENCOUNTER — Other Ambulatory Visit: Payer: Self-pay | Admitting: Family Medicine

## 2016-08-11 ENCOUNTER — Telehealth: Payer: Self-pay | Admitting: Family Medicine

## 2016-08-11 MED ORDER — OSELTAMIVIR PHOSPHATE 75 MG PO CAPS: 75.0000 mg | ORAL_CAPSULE | Freq: Every day | ORAL | 0 refills | Status: DC

## 2016-08-11 NOTE — Telephone Encounter (Signed)
Medication called/sent to requested pharmacy and pt aware via vm 

## 2016-08-11 NOTE — Telephone Encounter (Signed)
Patient is related to mr john g brown, says that he is in the hospital with the flu and he has been around him, says he needs you to call in tamiflu for him. [phone]

## 2016-08-18 DIAGNOSIS — H52203 Unspecified astigmatism, bilateral: Secondary | ICD-10-CM | POA: Diagnosis not present

## 2016-08-18 DIAGNOSIS — H2513 Age-related nuclear cataract, bilateral: Secondary | ICD-10-CM | POA: Diagnosis not present

## 2016-08-18 DIAGNOSIS — H524 Presbyopia: Secondary | ICD-10-CM | POA: Diagnosis not present

## 2016-08-18 DIAGNOSIS — H5203 Hypermetropia, bilateral: Secondary | ICD-10-CM | POA: Diagnosis not present

## 2016-08-28 ENCOUNTER — Ambulatory Visit: Payer: Medicare Other | Admitting: Family Medicine

## 2016-09-11 DIAGNOSIS — H6980 Other specified disorders of Eustachian tube, unspecified ear: Secondary | ICD-10-CM | POA: Diagnosis not present

## 2016-09-11 DIAGNOSIS — H903 Sensorineural hearing loss, bilateral: Secondary | ICD-10-CM | POA: Diagnosis not present

## 2016-09-11 DIAGNOSIS — H90A31 Mixed conductive and sensorineural hearing loss, unilateral, right ear with restricted hearing on the contralateral side: Secondary | ICD-10-CM | POA: Diagnosis not present

## 2016-09-11 DIAGNOSIS — H6121 Impacted cerumen, right ear: Secondary | ICD-10-CM | POA: Diagnosis not present

## 2016-09-11 DIAGNOSIS — J301 Allergic rhinitis due to pollen: Secondary | ICD-10-CM | POA: Diagnosis not present

## 2016-09-11 DIAGNOSIS — H9319 Tinnitus, unspecified ear: Secondary | ICD-10-CM | POA: Diagnosis not present

## 2016-11-10 ENCOUNTER — Other Ambulatory Visit: Payer: Self-pay | Admitting: Family Medicine

## 2016-11-10 ENCOUNTER — Encounter: Payer: Self-pay | Admitting: Family Medicine

## 2016-11-10 ENCOUNTER — Ambulatory Visit (INDEPENDENT_AMBULATORY_CARE_PROVIDER_SITE_OTHER): Payer: Medicare Other | Admitting: Family Medicine

## 2016-11-10 VITALS — BP 112/68 | HR 74 | Temp 98.1°F | Resp 16 | Ht 69.5 in | Wt 185.0 lb

## 2016-11-10 DIAGNOSIS — Z125 Encounter for screening for malignant neoplasm of prostate: Secondary | ICD-10-CM | POA: Diagnosis not present

## 2016-11-10 DIAGNOSIS — R5383 Other fatigue: Secondary | ICD-10-CM

## 2016-11-10 DIAGNOSIS — N400 Enlarged prostate without lower urinary tract symptoms: Secondary | ICD-10-CM

## 2016-11-10 DIAGNOSIS — R634 Abnormal weight loss: Secondary | ICD-10-CM

## 2016-11-10 LAB — CBC WITH DIFFERENTIAL/PLATELET
BASOS ABS: 62 {cells}/uL (ref 0–200)
Basophils Relative: 1 %
EOS ABS: 0 {cells}/uL — AB (ref 15–500)
Eosinophils Relative: 0 %
HEMATOCRIT: 45.6 % (ref 38.5–50.0)
HEMOGLOBIN: 15.1 g/dL (ref 13.0–17.0)
LYMPHS ABS: 744 {cells}/uL — AB (ref 850–3900)
Lymphocytes Relative: 12 %
MCH: 30 pg (ref 27.0–33.0)
MCHC: 33.1 g/dL (ref 32.0–36.0)
MCV: 90.7 fL (ref 80.0–100.0)
MPV: 10.5 fL (ref 7.5–12.5)
Monocytes Absolute: 434 cells/uL (ref 200–950)
Monocytes Relative: 7 %
NEUTROS ABS: 4960 {cells}/uL (ref 1500–7800)
NEUTROS PCT: 80 %
Platelets: 237 10*3/uL (ref 140–400)
RBC: 5.03 MIL/uL (ref 4.20–5.80)
RDW: 13.8 % (ref 11.0–15.0)
WBC: 6.2 10*3/uL (ref 3.8–10.8)

## 2016-11-10 NOTE — Patient Instructions (Signed)
We will call with lab results Try boost during the day  Schedule a physical with Dr. Dennard Schaumann

## 2016-11-10 NOTE — Progress Notes (Signed)
Subjective:    Patient ID: Theodore Davis, male    DOB: 11-30-1945, 71 y.o.   MRN: [id number]  Patient presents for Fatigue (x6 months- no energy, feel tired alot)  Pt here with fatigue which has been present for the past 5 months. Always feels tired. This has been going on since January. He is missing extensive family stressors in older family member who is mixed up on drugs and has been threatening the family. He states that he thinks about this a lot. He also admits that he has had decreased appetite he is not sure if it should stress related or if this something else going on. States that his mother did have a touch of leukemias A he would like to have his blood drawn. He denies any pain with eating or change in his bowels no change in his urinary pattern. He has known BPH was followed by urology but has not been seen in greater than a year. He is due for PSA testing. He denies any joint pain or shortness of breath no chest pain no other actual symptoms. States that he sleeps well. He is still able to do his regular activities. I looked back at his weight in June year 2018 he was 199 pounds he is down to 185 today.    Review Of Systems:  GEN- +fatigue, fever, +weight loss,weakness, recent illness HEENT- denies eye drainage, change in vision, nasal discharge, CVS- denies chest pain, palpitations RESP- denies SOB, cough, wheeze ABD- denies N/V, change in stools, abd pain GU- denies dysuria, hematuria, dribbling, incontinence MSK- denies joint pain, muscle aches, injury Neuro- denies headache, dizziness, syncope, seizure activity       Objective:    BP 112/68   Pulse 74   Temp 98.1 F (36.7 C) (Oral)   Resp 16   Ht 5' 9.5" (1.765 m)   Wt 185 lb (83.9 kg)   SpO2 98%   BMI 26.93 kg/m  GEN- NAD, alert and oriented x3 HEENT- PERRL, EOMI, non injected sclera, pink conjunctiva, MMM, oropharynx clear Neck- Supple, no thyromegaly CVS- RRR, no  murmur RESP-CTAB ABD-NABS,soft,NT,ND Psych- normal affect and mood  EXT- No edema Pulses- Radial, 2+        Assessment & Plan:      Problem List Items Addressed This Visit    None    Visit Diagnoses    Fatigue, unspecified type    -  Primary   Fatigue may be MTF, definately has some family stress going on, but concerned with family history as well. Start with CBC, CMET, PSA, TSH and see if any abnormality The meantime also recommended he try to increase protein intake eating use a supplement as well such as boost or ensure. He does not seem overly depressed or anxious in the setting of this family stress for not starting type of meat medication at this time.  His colonoscopy is up-to-date.    Relevant Orders   CBC with Differential/Platelet   Comprehensive metabolic panel   TSH   Weight loss       Relevant Orders   Comprehensive metabolic panel   TSH   Benign prostatic hyperplasia without lower urinary tract symptoms       Relevant Orders   PSA   Prostate cancer screening       Relevant Orders   PSA      Note: This dictation was prepared with Dragon dictation along with smaller phrase technology. Any transcriptional errors that result from  this process are unintentional.

## 2016-11-11 LAB — COMPREHENSIVE METABOLIC PANEL
ALBUMIN: 4.1 g/dL (ref 3.6–5.1)
ALT: 16 U/L (ref 9–46)
AST: 17 U/L (ref 10–35)
Alkaline Phosphatase: 64 U/L (ref 40–115)
BUN: 12 mg/dL (ref 7–25)
CALCIUM: 9.1 mg/dL (ref 8.6–10.3)
CHLORIDE: 107 mmol/L (ref 98–110)
CO2: 25 mmol/L (ref 20–31)
CREATININE: 0.95 mg/dL (ref 0.70–1.25)
Glucose, Bld: 146 mg/dL — ABNORMAL HIGH (ref 70–99)
POTASSIUM: 4.3 mmol/L (ref 3.5–5.3)
Sodium: 141 mmol/L (ref 135–146)
TOTAL PROTEIN: 6.2 g/dL (ref 6.1–8.1)
Total Bilirubin: 1.8 mg/dL — ABNORMAL HIGH (ref 0.2–1.2)

## 2016-11-11 LAB — TSH: TSH: 0.3 mIU/L — ABNORMAL LOW (ref 0.40–4.50)

## 2016-11-11 LAB — T4: T4, Total: 11 ug/dL (ref 4.5–12.0)

## 2016-11-11 LAB — PSA: PSA: 0.3 ng/mL (ref <=4.0)

## 2016-11-12 LAB — HEMOGLOBIN A1C
Hgb A1c MFr Bld: 5.4 % (ref <5.7)
Mean Plasma Glucose: 108 mg/dL

## 2016-11-12 LAB — T3: T3 TOTAL: 105 ng/dL (ref 76–181)

## 2016-11-13 ENCOUNTER — Other Ambulatory Visit: Payer: Self-pay | Admitting: *Deleted

## 2016-11-13 MED ORDER — MIRTAZAPINE 15 MG PO TABS: 7.5000 mg | ORAL_TABLET | Freq: Every day | ORAL | 3 refills | Status: DC

## 2016-12-29 ENCOUNTER — Encounter: Payer: Self-pay | Admitting: Family Medicine

## 2016-12-29 ENCOUNTER — Ambulatory Visit (INDEPENDENT_AMBULATORY_CARE_PROVIDER_SITE_OTHER): Payer: Medicare Other | Admitting: Family Medicine

## 2016-12-29 VITALS — BP 118/70 | HR 66 | Temp 98.5°F | Resp 16 | Ht 69.0 in | Wt 191.0 lb

## 2016-12-29 DIAGNOSIS — I10 Essential (primary) hypertension: Secondary | ICD-10-CM | POA: Diagnosis not present

## 2016-12-29 DIAGNOSIS — Z0000 Encounter for general adult medical examination without abnormal findings: Secondary | ICD-10-CM

## 2016-12-29 NOTE — Progress Notes (Signed)
Subjective:    Patient ID: Theodore Davis, male    DOB: 29-Nov-1945, 71 y.o.   MRN: [id number]  HPI  Patient is here today for complete physical exam. His last colonoscopy was in 2010 and is not due again until 2020. Over the last several months, there has been a tremendous stress in his family, his brother-in-law has been arrested more than 5 times and is threatening to kill several members of the family. His brother-in-law has severe mental issues and is also abusing cocaine. The constant verbal threats, legal proceedings, have taken a toll on the patient. His weight last year was 199. He states that he recently got as low as 179. He had very little appetite and trouble sleeping. He saw my partner who perform lab work including a CBC, CMP, PSA, TSH, and even a hemoglobin A1c. Labs were only significant for borderline low TSH but normal T3 and T4. PSA was 0.3. Hemoglobin A1c was 5.4. The remainder of his lab work was normal. I believe his weight loss was due to anxiety and stress. His brother-in-law has now been in jail for more than a month. As the stress level has decreased, the patient's appetite has returned and he is gaining weight back to 191 pounds. His immunization record is listed below: Immunization History  Administered Date(s) Administered  . Pneumococcal Conjugate-13 10/18/2013  . Pneumococcal Polysaccharide-23 10/24/2014  . Td 10/17/2010  . Tdap 10/17/2010    Past Medical History:  Diagnosis Date  . Arthritis 10/03/2010   right sternoclavicular joint  . Heart murmur   . Hemangioma of spine    lower lumbar  . HOH (hard of hearing)    R ear  . Hyperlipidemia    hypercholesterolemia  . Lyme disease    Past Surgical History:  Procedure Laterality Date  . CARDIOVASCULAR STRESS TEST  08/2010  . CHOLECYSTECTOMY    . HERNIA REPAIR     R inguinal  . JOINT REPLACEMENT Right   . MASTOID DEBRIDEMENT     R ear  . RESECTION DISTAL CLAVICAL Left 11/30/2014   Procedure: RESECTION  DISTAL CLAVICAL;  Surgeon: Kathryne Hitch, MD;  Location: [facility];  Service: Orthopedics;  Laterality: Left;  . SHOULDER ARTHROSCOPY WITH BICEPSTENOTOMY Left 11/30/2014   Procedure: SHOULDER ARTHROSCOPY WITH BICEPSTENOTOMY;  Surgeon: Kathryne Hitch, MD;  Location: [facility];  Service: Orthopedics;  Laterality: Left;  . SHOULDER ARTHROSCOPY WITH ROTATOR CUFF REPAIR AND SUBACROMIAL DECOMPRESSION Left 11/30/2014   Procedure: LEFT SHOULDER ARTHROSCOPY WITH DISTAL CLAVICAL EXCISION, ACROMIOPLASTY, ROTATOR CUFF REPAIR , BICEPS TENOTOMY;  Surgeon: Kathryne Hitch, MD;  Location: [place];  Service: Orthopedics;  Laterality: Left;  . TENDON REPAIR  2005   R elbow  . TOTAL KNEE ARTHROPLASTY  05/28/2011   Procedure: TOTAL KNEE ARTHROPLASTY;  Surgeon: Ninetta Lights, MD;  Location: [place];  Service: Orthopedics;  Laterality: Right;  Right total knee arthroplasty   . US ECHOCARDIOGRAPHY  08/2010   Current Outpatient Prescriptions on File Prior to Visit  Medication Sig Dispense Refill  . magnesium oxide (MAG-OX) 400 MG tablet Take 400 mg by mouth 2 (two) times daily.    . mirtazapine (REMERON) 15 MG tablet Take 0.5 tablets (7.5 mg total) by mouth at bedtime. 15 tablet 3  . RAPAFLO 8 MG CAPS capsule Take 1 capsule (8 mg total) by mouth daily with breakfast. 90 capsule 3  . simvastatin (ZOCOR) 20 MG tablet TAKE 1 TABLET NIGHTLY AT BEDTIME 90  tablet 3   No current facility-administered medications on file prior to visit.    Allergies  Allergen Reactions  . Codeine Other (See Comments)    Unknown.  . Lipitor [Atorvastatin Calcium] Other (See Comments)    myalgias  . Sudafed [Pseudoephedrine Hcl] Other (See Comments)    unknown   Social History   Social History  . Marital status: Married    Spouse name: N/A  . Number of children: N/A  . Years of education: N/A   Occupational History  . Not on file.   Social History Main Topics  . Smoking status: Former  Smoker    Quit date: 07/12/1998  . Smokeless tobacco: Never Used  . Alcohol use No  . Drug use: No  . Sexual activity: Yes   Other Topics Concern  . Not on file   Social History Narrative  . No narrative on file   Family History  Problem Relation Age of Onset  . Heart disease Mother   . Hypertension Mother   . Heart disease Father   . Arthritis Father        rheumatoid  . Cancer Father        Bladder     Review of Systems  All other systems reviewed and are negative.      Objective:   Physical Exam  Constitutional: He is oriented to person, place, and time. He appears well-developed and well-nourished. No distress.  HENT:  Head: Normocephalic and atraumatic.  Right Ear: External ear normal.  Left Ear: External ear normal.  Nose: Nose normal.  Mouth/Throat: Oropharynx is clear and moist. No oropharyngeal exudate.  Eyes: Conjunctivae and EOM are normal. Pupils are equal, round, and reactive to light. Right eye exhibits no discharge. Left eye exhibits no discharge. No scleral icterus.  Neck: Normal range of motion. Neck supple. No JVD present. No tracheal deviation present. No thyromegaly present.  Cardiovascular: Normal rate, regular rhythm, normal heart sounds and intact distal pulses.  Exam reveals no gallop and no friction rub.   No murmur heard. Pulmonary/Chest: Effort normal and breath sounds normal. No respiratory distress. He has no wheezes. He has no rales. He exhibits no tenderness.  Abdominal: Soft. Bowel sounds are normal. He exhibits no distension and no mass. There is no tenderness. There is no rebound and no guarding.  Genitourinary: Rectum normal, prostate normal and penis normal.  Musculoskeletal: Normal range of motion. He exhibits no edema or tenderness.  Lymphadenopathy:    He has no cervical adenopathy.  Neurological: He is alert and oriented to person, place, and time. He has normal reflexes. No cranial nerve deficit. He exhibits normal muscle tone.  Coordination normal.  Skin: Skin is warm. No rash noted. He is not diaphoretic. No erythema. No pallor.  Psychiatric: He has a normal mood and affect. His behavior is normal. Judgment and thought content normal.  Vitals reviewed.        Assessment & Plan:  Benign essential HTN - Plan: Lipid panel  General medical exam I believe his fatigue and weight loss was due to anxiety and stress. As the stress level has decreased with the incarceration, his weight is improving and his fatigue is improving. Immunizations are up-to-date. Colonoscopy is up-to-date. PSA was normal. Rectal exam today is normal. I reviewed all of his recent lab work from May which is outstanding. He did have borderline subclinical hyperthyroidism. I recommended rechecking a TSH in September. Otherwise I will check a fasting lipid panel as the  remainder of his lab work is artery been completed.  He declines hepatitis C testing

## 2016-12-30 LAB — LIPID PANEL
CHOL/HDL RATIO: 2.7 ratio (ref <5.0)
CHOLESTEROL: 193 mg/dL (ref <200)
HDL: 71 mg/dL (ref >40)
LDL Cholesterol: 106 mg/dL — ABNORMAL HIGH (ref <100)
TRIGLYCERIDES: 80 mg/dL (ref <150)
VLDL: 16 mg/dL (ref <30)

## 2016-12-31 ENCOUNTER — Encounter: Payer: Self-pay | Admitting: Family Medicine

## 2017-02-04 DIAGNOSIS — N401 Enlarged prostate with lower urinary tract symptoms: Secondary | ICD-10-CM | POA: Diagnosis not present

## 2017-02-04 DIAGNOSIS — R3912 Poor urinary stream: Secondary | ICD-10-CM | POA: Diagnosis not present

## 2017-02-04 DIAGNOSIS — Z125 Encounter for screening for malignant neoplasm of prostate: Secondary | ICD-10-CM | POA: Diagnosis not present

## 2017-03-09 ENCOUNTER — Other Ambulatory Visit: Payer: Self-pay

## 2017-03-09 MED ORDER — SIMVASTATIN 20 MG PO TABS
ORAL_TABLET | ORAL | 3 refills | Status: DC
Start: 2017-03-09 — End: 2017-03-10

## 2017-03-10 ENCOUNTER — Other Ambulatory Visit: Payer: Self-pay

## 2017-03-10 MED ORDER — SIMVASTATIN 20 MG PO TABS: ORAL_TABLET | ORAL | 1 refills | Status: DC

## 2017-04-07 ENCOUNTER — Encounter (HOSPITAL_COMMUNITY): Payer: Self-pay

## 2017-04-07 ENCOUNTER — Emergency Department (HOSPITAL_COMMUNITY): Payer: Medicare Other

## 2017-04-07 ENCOUNTER — Emergency Department (HOSPITAL_COMMUNITY)
Admission: EM | Admit: 2017-04-07 | Discharge: 2017-04-07 | Disposition: A | Discharge status: Home / Self Care | Payer: Medicare Other | Attending: Physician Assistant | Admitting: Physician Assistant

## 2017-04-07 DIAGNOSIS — R0789 Other chest pain: Secondary | ICD-10-CM | POA: Diagnosis not present

## 2017-04-07 DIAGNOSIS — I700 Atherosclerosis of aorta: Secondary | ICD-10-CM | POA: Diagnosis not present

## 2017-04-07 DIAGNOSIS — R0602 Shortness of breath: Secondary | ICD-10-CM | POA: Diagnosis not present

## 2017-04-07 DIAGNOSIS — Z79899 Other long term (current) drug therapy: Secondary | ICD-10-CM | POA: Diagnosis not present

## 2017-04-07 DIAGNOSIS — Z87891 Personal history of nicotine dependence: Secondary | ICD-10-CM | POA: Insufficient documentation

## 2017-04-07 DIAGNOSIS — Z96651 Presence of right artificial knee joint: Secondary | ICD-10-CM | POA: Insufficient documentation

## 2017-04-07 LAB — BASIC METABOLIC PANEL
Anion gap: 6 (ref 5–15)
BUN: 9 mg/dL (ref 6–20)
CHLORIDE: 107 mmol/L (ref 101–111)
CO2: 26 mmol/L (ref 22–32)
CREATININE: 1.08 mg/dL (ref 0.61–1.24)
Calcium: 8.8 mg/dL — ABNORMAL LOW (ref 8.9–10.3)
GFR calc Af Amer: >60 mL/min (ref >60)
GFR calc non Af Amer: >60 mL/min (ref >60)
GLUCOSE: 123 mg/dL — AB (ref 65–99)
POTASSIUM: 4.2 mmol/L (ref 3.5–5.1)
Sodium: 139 mmol/L (ref 135–145)

## 2017-04-07 LAB — I-STAT TROPONIN, ED
TROPONIN I, POC: 0 ng/mL (ref 0.00–0.08)
Troponin i, poc: 0 ng/mL (ref 0.00–0.08)

## 2017-04-07 LAB — CBC
HCT: 44.7 % (ref 39.0–52.0)
Hemoglobin: 15.7 g/dL (ref 13.0–17.0)
MCH: 31.3 pg (ref 26.0–34.0)
MCHC: 35.1 g/dL (ref 30.0–36.0)
MCV: 89 fL (ref 78.0–100.0)
Platelets: 209 10*3/uL (ref 150–400)
RBC: 5.02 MIL/uL (ref 4.22–5.81)
RDW: 13.2 % (ref 11.5–15.5)
WBC: 5.3 10*3/uL (ref 4.0–10.5)

## 2017-04-07 MED ORDER — IOPAMIDOL (ISOVUE-370) INJECTION 76%
INTRAVENOUS | Status: AC
  Administered 2017-04-07: 100 mL
  Filled 2017-04-07: qty 100

## 2017-04-07 NOTE — ED Provider Notes (Signed)
[facility] Provider Note   CSN: [id number] Arrival date & time: 04/07/17  1638     History   Chief Complaint Chief Complaint  Patient presents with  . Shortness of Breath    HPI Theodore Davis is a 71 y.o. male with history of heart murmur, HLD who presents today with chief complaint gradual onset, progressively worsening shortness of breath for 2-3 weeks. He states that shortness of breath is constant, worse first thing in the morning when he awakes. He states that with activity "I forget that I am short of breath" but states he does feel short of breath at rest and laying flat. He also endorses constant left sided chest pressure and squeezing sensation. Pain does not radiate. He endorses intermittent nausea but no vomiting. Denies abdominal pain. Denies leg swelling or heart palpitations. He is a former smoker and quit greater than 10 years ago. Denies recent travel or surgeries, hemoptysis, hormone replacement/on testosterone, or prior history of DVT or PE. When asked about weight loss he states "it fluctuates, I am under a lot of stress". He does endorse intermittent night sweats for the past 6 months. Has not tried anything for his symptoms.  Per last cardiology note with Dr.Kelly 07/16/2016 "An echo Doppler study on 05/15/2015 showed an ejection fraction at 55-60% with normal wall motion.  There was a suggestion of mild grade 1 diastolic dysfunction.  Tissue Doppler was indeterminant.  There was mild thickening of the mitral valve with trivial mitral regurgitation."  he states that he had a catheterization in 2003 which showed mild stenosis of a coronary coronary artery, but he is unsure which.   The history is provided by the patient.    Past Medical History:  Diagnosis Date  . Arthritis 10/03/2010   right sternoclavicular joint  . Heart murmur   . Hemangioma of spine    lower lumbar  . HOH (hard of hearing)    R ear  . Hyperlipidemia      hypercholesterolemia  . Lyme disease     Patient Active Problem List   Diagnosis Date Noted  . Family history of heart disease 07/17/2015  . Hyperlipidemia LDL goal <70 05/09/2015  . Dyspnea 05/09/2015  . H/O seasonal allergies 05/09/2015    Past Surgical History:  Procedure Laterality Date  . CARDIOVASCULAR STRESS TEST  08/2010  . CHOLECYSTECTOMY    . HERNIA REPAIR     R inguinal  . JOINT REPLACEMENT Right   . MASTOID DEBRIDEMENT     R ear  . RESECTION DISTAL CLAVICAL Left 11/30/2014   Procedure: RESECTION DISTAL CLAVICAL;  Surgeon: Kathryne Hitch, MD;  Location: [facility];  Service: Orthopedics;  Laterality: Left;  . SHOULDER ARTHROSCOPY WITH BICEPSTENOTOMY Left 11/30/2014   Procedure: SHOULDER ARTHROSCOPY WITH BICEPSTENOTOMY;  Surgeon: Kathryne Hitch, MD;  Location: [facility];  Service: Orthopedics;  Laterality: Left;  . SHOULDER ARTHROSCOPY WITH ROTATOR CUFF REPAIR AND SUBACROMIAL DECOMPRESSION Left 11/30/2014   Procedure: LEFT SHOULDER ARTHROSCOPY WITH DISTAL CLAVICAL EXCISION, ACROMIOPLASTY, ROTATOR CUFF REPAIR , BICEPS TENOTOMY;  Surgeon: Kathryne Hitch, MD;  Location: [facility];  Service: Orthopedics;  Laterality: Left;  . TENDON REPAIR  2005   R elbow  . TOTAL KNEE ARTHROPLASTY  05/28/2011   Procedure: TOTAL KNEE ARTHROPLASTY;  Surgeon: Ninetta Lights, MD;  Location: [place];  Service: Orthopedics;  Laterality: Right;  Right total knee arthroplasty   . US ECHOCARDIOGRAPHY  08/2010  Home Medications    Prior to Admission medications   Medication Sig Start Date End Date Taking? Authorizing Provider  magnesium oxide (MAG-OX) 400 MG tablet Take 400 mg by mouth daily.    Yes [provider]  RAPAFLO 8 MG CAPS capsule Take 1 capsule (8 mg total) by mouth daily with breakfast. 01/02/16  Yes Susy Frizzle, MD  simvastatin (ZOCOR) 20 MG tablet TAKE 1 TABLET NIGHTLY AT BEDTIME 03/10/17  Yes Susy Frizzle, MD   mirtazapine (REMERON) 15 MG tablet Take 0.5 tablets (7.5 mg total) by mouth at bedtime. Patient not taking: Reported on 04/07/2017 11/13/16   Alycia Rossetti, MD    Family History Family History  Problem Relation Age of Onset  . Heart disease Mother   . Hypertension Mother   . Heart disease Father   . Arthritis Father        rheumatoid  . Cancer Father        Bladder    Social History Social History  Substance Use Topics  . Smoking status: Former Smoker    Quit date: 07/12/1998  . Smokeless tobacco: Never Used  . Alcohol use No     Allergies   Codeine and Lipitor [atorvastatin calcium]   Review of Systems Review of Systems  Constitutional: Negative for chills, diaphoresis and fever.  HENT: Negative for congestion, sinus pain and sinus pressure.   Respiratory: Positive for chest tightness and shortness of breath. Negative for cough.   Cardiovascular: Positive for chest pain and palpitations. Negative for leg swelling.  Gastrointestinal: Positive for nausea. Negative for abdominal pain and vomiting.  All other systems reviewed and are negative.    Physical Exam Updated Vital Signs BP 117/83   Pulse 67   Temp 97.9 F (36.6 C) (Oral)   Resp 19   SpO2 95%   Physical Exam  Constitutional: He appears well-developed and well-nourished. No distress.  HENT:  Head: Normocephalic and atraumatic.  Eyes: Conjunctivae are normal. Right eye exhibits no discharge. Left eye exhibits no discharge.  Neck: Normal range of motion. Neck supple. No JVD present. No tracheal deviation present.  Cardiovascular: Normal rate and regular rhythm.   Murmur heard. 2/6 pansystolic murmur, 2+ radial and DP/PT pulses bl, negative Homan's bl, no lower extremity edema  Pulmonary/Chest: Effort normal and breath sounds normal. No respiratory distress. He has no wheezes. He has no rales. He exhibits no tenderness.  Equal rise and fall of chest, no increased work of breathing, speaking in full  sentences without difficulty  Abdominal: Soft. Bowel sounds are normal. He exhibits no distension.  Musculoskeletal: Normal range of motion. He exhibits no edema.  Neurological: He is alert.  Skin: Skin is warm and dry. No erythema.  Psychiatric: He has a normal mood and affect. His behavior is normal.  Nursing note and vitals reviewed.    ED Treatments / Results  Labs (all labs ordered are listed, but only abnormal results are displayed) Labs Reviewed  BASIC METABOLIC PANEL - Abnormal; Notable for the following:       Result Value   Glucose, Bld 123 (*)    Calcium 8.8 (*)    All other components within normal limits  CBC  I-STAT TROPONIN, ED  I-STAT TROPONIN, ED  I-STAT TROPONIN, ED    EKG  EKG Interpretation  Date/Time:  Tuesday April 07 2017 08:47:57 EDT Ventricular Rate:  67 PR Interval:  182 QRS Duration: 84 QT Interval:  392 QTC Calculation: 414 R Axis:   -  39 Text Interpretation:  Normal sinus rhythm Left axis deviation Possible Anterior infarct , age undetermined Abnormal ECG No significant change since last tracing Confirmed by Zenovia Jarred [phone]) on 04/07/2017 9:58:27 AM       Radiology Dg Chest 2 View  Result Date: 04/07/2017 CLINICAL DATA:  Short of breath, upper left chest tightness for 2 weeks EXAM: CHEST  2 VIEW COMPARISON:  Chest x-ray of 05/07/2015 FINDINGS: No pneumonia or effusion is seen. There is a vague nodular opacity overlying the anterior right first costochondral junction which could simply represent overlapping bony structure and vascular channels. However attention to this area on followup chest x-ray is recommended versus CT the chest without IV contrast media. Otherwise the lungs are clear. Mediastinal and hilar contours are unremarkable. The heart is within normal limits in size. No acute bony abnormality is seen. Mild thoracic aortic atherosclerosis is present. IMPRESSION: 1. Questionable nodular opacity overlying the anterior right  first rib which could be bony in origin, but a subtle nodule cannot be excluded. Consider CT of the chest without IV contrast media. 2. No pneumonia or effusion. Electronically Signed   By: Ivar Drape M.D.   On: 04/07/2017 09:17   Ct Angio Chest Pe W And/or Wo Contrast  Result Date: 04/07/2017 CLINICAL DATA:  Chest pain, shortness of Breath EXAM: CT ANGIOGRAPHY CHEST WITH CONTRAST TECHNIQUE: Multidetector CT imaging of the chest was performed using the standard protocol during bolus administration of intravenous contrast. Multiplanar CT image reconstructions and MIPs were obtained to evaluate the vascular anatomy. CONTRAST:  100 cc Isovue 370 IV COMPARISON:  Chest x-ray 04/07/2017 FINDINGS: Cardiovascular: Heart is normal size. Aortic arch calcifications. No evidence of aortic aneurysm or dissection. No filling defects in the pulmonary artery is to suggest pulmonary emboli. Mediastinum/Nodes: Borderline size scattered mediastinal lymph nodes, the largest in the left AP window measuring 12 mm in short axis diameter. No axillary or hilar adenopathy. Trachea and esophagus are unremarkable. Low-density nodule posteriorly in the left lobe of the thyroid measuring up to 1.9 cm. Lungs/Pleura: Lungs are clear. No focal airspace opacities or suspicious nodules. No effusions. Upper Abdomen: Imaging into the upper abdomen shows no acute findings. Musculoskeletal: Chest wall soft tissues are unremarkable. No acute bony abnormality. Review of the MIP images confirms the above findings. IMPRESSION: No evidence of pulmonary embolus. Mildly prominent mediastinal lymph nodes, largest in the AP window. These may be reactive. Recommend follow-up CT in 6 months to assess stability. Aortic Atherosclerosis (ICD10-I70.0). Electronically Signed   By: Rolm Baptise M.D.   On: 04/07/2017 10:56    Procedures Procedures (including critical care time)  Medications Ordered in ED Medications  iopamidol (ISOVUE-370) 76 % injection  (100 mLs  Contrast Given 04/07/17 1031)     Initial Impression / Assessment and Plan / ED Course  I have reviewed the triage vital signs and the nursing notes.  Pertinent labs & imaging results that were available during my care of the patient were reviewed by me and considered in my medical decision making (see chart for details).     Patient presents with complaint of shortness of breath, particularly worse first thing in the morning. Afebrile, vital signs are stable, SPO2 saturations greater than 95% on room air. No increased work of breathing on examination, and breath sounds are clear to auscultation. Chart review shows that he has had similar symptoms in the past for which cardiology has evaluated. Chest x-ray showed questionable nodular opacity overlying the anterior right first rib  which could be bony in origin, but a subtle nodule cannot be excluded. No evidence of pneumonia or effusion. Given age and symptoms, will obtain CTA of the chest to evaluate the nodule and also rule out PE. Imaging showed no evidence of PE but did show mediastinal lymph nodes. The radiologist recommends repeat imaging in 6 months to assess stability. EKG shows no significant change from last with no significant ST segment abnormality or arrhythmia. Serial troponins are negative. Remainder of lab work is reassuring. Low suspicion of ACS or MI given workup. No evidence of pericarditis, myocarditis. His workup is reassuring. No further emergent workup required at this time. He is stable for discharge home with follow-up with PCP and will give referral for pulmonology. Discussed indications for return to the ED. Pt and patient's wife verbalized understanding of and agreement with plan and pt is safe for discharge home at this time. Patient seen and evaluated by Dr. Thomasene Lot, who agrees with assessment and plan at this time.   Final Clinical Impressions(s) / ED Diagnoses   Final diagnoses:  SOB (shortness of breath)    Atypical chest pain    New Prescriptions New Prescriptions   No medications on file     Renita Papa, PA-C 04/07/17 1447    Macarthur Critchley, MD 04/13/17 1504

## 2017-04-07 NOTE — Discharge Instructions (Signed)
Follow-up with your primary care physician for reevaluation. He may also follow up with pulmonology for evaluation of your shortness of breath.return to the ED if any concerning signs or symptoms develop.

## 2017-04-07 NOTE — ED Triage Notes (Signed)
Patient complains of 2 weeks of being awakened in the early am with SOB. States that he feels like he cant get a good breath, no cough, no congestion. Complains of chest tightness with same. Alert and oriented

## 2017-04-07 NOTE — ED Notes (Signed)
Pt ambulated to room from waiting room, tolerated well. 

## 2017-04-08 ENCOUNTER — Encounter: Payer: Self-pay | Admitting: Pulmonary Disease

## 2017-04-08 ENCOUNTER — Ambulatory Visit (INDEPENDENT_AMBULATORY_CARE_PROVIDER_SITE_OTHER): Payer: Medicare Other | Admitting: Pulmonary Disease

## 2017-04-08 VITALS — BP 122/72 | HR 62 | Ht 69.5 in | Wt 189.1 lb

## 2017-04-08 DIAGNOSIS — R011 Cardiac murmur, unspecified: Secondary | ICD-10-CM | POA: Diagnosis not present

## 2017-04-08 DIAGNOSIS — R590 Localized enlarged lymph nodes: Secondary | ICD-10-CM | POA: Insufficient documentation

## 2017-04-08 DIAGNOSIS — R0602 Shortness of breath: Secondary | ICD-10-CM

## 2017-04-08 NOTE — Patient Instructions (Addendum)
   Contact me if you have any new breathing problems or questions before your next appointment.  We will review your test results at your next appointment.   TESTS ORDERED: 1. Full pulmonary function test before next appointment 2. 6 minute walk test on room air before next appointment 3. Complete echocardiogram before next appointment 4. Serum LDH & differential today.  5. CT Chest w/o in 3 months

## 2017-04-08 NOTE — Progress Notes (Signed)
Subjective:    Patient ID: Theodore Davis, male    DOB: 07-12-1945, 71 y.o.   MRN: [id number]  HPI I reviewed the patient's electronic medical record which is summarized as follows. Patient was seen in the emergency department yesterday with reported gradual onset of shortness of breath occurring over 2-3 weeks. At the time he reported the shortness of breath as constant and worse first thing in the morning. He endorsed dyspnea at rest as well as when laying flat. He also reported a left-sided chest discomfort qualified as a squeezing sensation without radiation. At that time the patient also endorsed nausea but no vomiting or abdominal pain. He also denied any heart palpitations or leg swelling at that time. The patient admitted that he was under a lot of stress and had been experiencing night sweats for the past 6 months. Patient underwent EKG, serum workup, and chest imaging. Given the borderline lymph nodes noted on his CT angiogram of the chest he was referred to our office.  He again reports he has noticed dyspnea for 2-3 weeks. He wakes up with dyspnea when laying recumbent. He feels like he has to "tell himself to breathe". He reports his dyspnea is unchanged at rest or with exertion. He denies any pleuritic chest pain. He does have some mild chest discomfort in his left lateral chest. No fever or chills. He does report he does wake up at night occasional with sweats. No rashes or bruising. Weight seems to fluctuate but overall no weight loss. No adenopathy in his neck, groin, or axilla. He reports his sleep quality is poor. He generally goes to bed from 9-11pm & wakes up 5-6 am. He primarily sleeps the whole night. He denies any morning headaches. He does nap occasionally. He does doze off easily. Denies any snoring or witnessed apnea.   Review of Systems He reports occasional nausea that is rare. No emesis, abdominal pain or diarrhea. He does have some urinary hesitancy. No dysuria or  hematuria. A pertinent 14 point review of systems is negative except as per the history of presenting illness.  Allergies  Allergen Reactions  . Codeine Nausea And Vomiting  . Lipitor [Atorvastatin Calcium] Other (See Comments)    myalgias    Current Outpatient Prescriptions on File Prior to Visit  Medication Sig Dispense Refill  . magnesium oxide (MAG-OX) 400 MG tablet Take 400 mg by mouth daily.     . mirtazapine (REMERON) 15 MG tablet Take 0.5 tablets (7.5 mg total) by mouth at bedtime. 15 tablet 3  . RAPAFLO 8 MG CAPS capsule Take 1 capsule (8 mg total) by mouth daily with breakfast. 90 capsule 3  . simvastatin (ZOCOR) 20 MG tablet TAKE 1 TABLET NIGHTLY AT BEDTIME 90 tablet 1   No current facility-administered medications on file prior to visit.     Past Medical History:  Diagnosis Date  . Arthritis 10/03/2010   right sternoclavicular joint  . Heart murmur   . Hemangioma of spine    lower lumbar  . HOH (hard of hearing)    R ear  . Hyperlipidemia    hypercholesterolemia  . Lyme disease     Past Surgical History:  Procedure Laterality Date  . CARDIOVASCULAR STRESS TEST  08/2010  . CHOLECYSTECTOMY    . HERNIA REPAIR     R inguinal  . JOINT REPLACEMENT Right   . MASTOID DEBRIDEMENT     R ear  . RESECTION DISTAL CLAVICAL Left 11/30/2014   Procedure: RESECTION  DISTAL CLAVICAL;  Surgeon: Kathryne Hitch, MD;  Location: [facility];  Service: Orthopedics;  Laterality: Left;  . SHOULDER ARTHROSCOPY WITH BICEPSTENOTOMY Left 11/30/2014   Procedure: SHOULDER ARTHROSCOPY WITH BICEPSTENOTOMY;  Surgeon: Kathryne Hitch, MD;  Location: [facility];  Service: Orthopedics;  Laterality: Left;  . SHOULDER ARTHROSCOPY WITH ROTATOR CUFF REPAIR AND SUBACROMIAL DECOMPRESSION Left 11/30/2014   Procedure: LEFT SHOULDER ARTHROSCOPY WITH DISTAL CLAVICAL EXCISION, ACROMIOPLASTY, ROTATOR CUFF REPAIR , BICEPS TENOTOMY;  Surgeon: Kathryne Hitch, MD;  Location: [facility];  Service: Orthopedics;  Laterality: Left;  . TENDON REPAIR  2005   R elbow  . TOTAL KNEE ARTHROPLASTY  05/28/2011   Procedure: TOTAL KNEE ARTHROPLASTY;  Surgeon: Ninetta Lights, MD;  Location: [facility];  Service: Orthopedics;  Laterality: Right;  Right total knee arthroplasty   . US ECHOCARDIOGRAPHY  08/2010    Family History  Problem Relation Age of Onset  . Heart disease Mother   . Atrial fibrillation Mother   . Heart disease Father   . Rheum arthritis Father   . Bladder Cancer Father   . Atrial fibrillation Father   . Brain cancer Maternal Uncle   . Lung disease Neg Hx     Social History   Social History  . Marital status: Married    Spouse name: N/A  . Number of children: N/A  . Years of education: N/A   Social History Main Topics  . Smoking status: Former Smoker    Packs/day: 0.25    Years: 50.00    Quit date: 06/23/1997  . Smokeless tobacco: Former Systems developer  . Alcohol use Yes     Comment: occasional beer  . Drug use: No  . Sexual activity: Yes   Other Topics Concern  . None   Social History Narrative   [facility] (04/08/17):   Originally from [facility]. Has lived in [place] as well. Previously worked for the Education officer, environmental as a Dealer. Has 2 dogs & a rabbit currently. No bird exposure. He reports black mold was found in his air conditioner at [facility] when it was changed out 2 years ago. No hot tub exposure. Enjoys fishing.       Objective:   Physical Exam BP 122/72 (BP Location: Left Arm, Cuff Size: Normal)   Pulse 62   Ht 5' 9.5" (1.765 m)   Wt 189 lb 2 oz (85.8 kg)   SpO2 98%   BMI 27.53 kg/m  General:  Awake. Alert. No acute distress. Caucasian male.  Integument:  Warm & dry. No rash on exposed skin. No bruising on exposed skin. Extremities:  No cyanosis or clubbing.  Lymphatics:  No appreciated cervical or supraclavicular lymphadenoapthy. HEENT:  Moist mucus membranes. No oral ulcers. Moderate bilateral nasal turbinate swelling with pale mucosa.  No scleral icterus. Cardiovascular:  Regular rate. No edema. Regular rhythm. Systolic ejection murmur appreciated. Pulmonary:  Good aeration & clear to auscultation bilaterally. Symmetric chest wall expansion. No accessory muscle use on room air. Abdomen: Soft. Normal bowel sounds. Nondistended. Grossly nontender. No hepatosplenomegaly. Musculoskeletal:  Normal bulk and tone. Hand grip strength 5/5 bilaterally. No joint deformity or effusion appreciated. Neurological:  CN 2-12 grossly in tact. No meningismus. Moving all 4 extremities equally. Symmetric brachioradialis deep tendon reflexes. Psychiatric:  Mood and affect congruent. Speech normal rhythm, rate & tone.   IMAGING CTA CHEST 04/07/17 (personally reviewed by me):  There is some suggestion of mosaic attenuation. No parenchymal mass, nodule, or opacity appreciated. No  pleural effusion or thickening. No pericardial effusion. Multiple mediastinal lymph nodes are noted. Radiology commented upon a lymph node that was precarinal measuring 1.2 cm. I was able to find a 1.1 cm lymph node measuring in the short axis on my review. Otherwise, all the other lymph nodes appear to be borderline but not pathologically enlarged. There are no other CT images available for my comparison.   CARDIAC TTE (05/15/15):  LV normal in size with EF 55-60%. No regional wall motion abnormalities & grade 1 diastolic dysfunction. LA & RA normal in size. RV normal in size and function. Pulmonary artery systolic pressure 18 mmHg. No aortic stenosis or regurgitation. Aortic root normal in size. Trivial mitral regurgitation. No significant pulmonic regurgitation. Trivial tricuspid regurgitation. No pericardial effusion.  LABS 04/07/17: CBC: 5.3/15.7/44.7/209 BMP: 139/4.2/107/26/9/1.08/123/8.8    Assessment & Plan:  71 y.o. male with 2-3 weeks of dyspnea at rest as well as with exertion. Patient also endorses shortness of breath especially with laying flat. With his known  history of mitral regurgitation I feel that reevaluation with echocardiogram is necessary. With his prior tobacco use and exposure evaluation for COPD and airways dysfunction especially in the setting of mosaic attenuation suggested by his CT angiogram noted above. There is mediastinal adenopathy could be due to some passive congestion or reactive but there is no parenchymal process that would suggest a source for reactive lymph nodes. He does have a prior history of black mold exposure but this was 2 years ago and unlikely to be of significance now. I instructed the patient to contact my office if he had new breathing problems, questions, or concerns before his next appointment.  1. Shortness of breath:  Checking 6 minute walk test on room air and full pulmonary function testing before next appointment. 2. Mediastinal lymphadenopathy:  Checking CT chest without contrast in 3 months. Checking serum LDH and differential. 3. Heart murmur: Checking complete echocardiogram. 4. Health maintenance: Status post Prevnar April 2015, Pneumovax May 2016, & Tdap April 2012. 5. Follow-up: Return to clinic in 4 weeks or sooner if needed.  Sonia Baller Ashok Cordia, M.D. [facility]monary & Critical Care Pager:  [phone] After 7pm or if no response, call [phone] 10:40 AM 04/08/17

## 2017-04-21 ENCOUNTER — Other Ambulatory Visit: Payer: Self-pay

## 2017-04-21 ENCOUNTER — Ambulatory Visit (INDEPENDENT_AMBULATORY_CARE_PROVIDER_SITE_OTHER): Payer: Medicare Other | Admitting: *Deleted

## 2017-04-21 ENCOUNTER — Ambulatory Visit (HOSPITAL_COMMUNITY): Payer: Medicare Other | Attending: Cardiology

## 2017-04-21 DIAGNOSIS — R0602 Shortness of breath: Secondary | ICD-10-CM | POA: Diagnosis not present

## 2017-04-21 DIAGNOSIS — I517 Cardiomegaly: Secondary | ICD-10-CM | POA: Insufficient documentation

## 2017-04-21 DIAGNOSIS — E785 Hyperlipidemia, unspecified: Secondary | ICD-10-CM | POA: Insufficient documentation

## 2017-04-21 MED ORDER — PERFLUTREN LIPID MICROSPHERE
1.0000 mL | INTRAVENOUS | Status: AC | PRN
  Administered 2017-04-21: 2 mL via INTRAVENOUS

## 2017-04-21 NOTE — Progress Notes (Signed)
SIX MIN WALK 04/21/2017  Medications none  Supplimental Oxygen during Test? (L/min) No  Laps 8  Partial Lap (in Meters) 18  Baseline BP (sitting) 130/70  Baseline Heartrate 56  Baseline Dyspnea (Borg Scale) 0  Baseline Fatigue (Borg Scale) 0.5  Baseline SPO2 100  BP (sitting) 140/70  Heartrate 74  Dyspnea (Borg Scale) 0  Fatigue (Borg Scale) 0  SPO2 100  BP (sitting) 132/70  Heartrate 58  SPO2 100  Stopped or Paused before Six Minutes No  Distance Completed 402

## 2017-04-22 ENCOUNTER — Ambulatory Visit (INDEPENDENT_AMBULATORY_CARE_PROVIDER_SITE_OTHER): Payer: Medicare Other | Admitting: Pulmonary Disease

## 2017-04-22 DIAGNOSIS — R0602 Shortness of breath: Secondary | ICD-10-CM

## 2017-04-22 LAB — PULMONARY FUNCTION TEST
DL/VA % pred: 105 %
DL/VA: 4.81 ml/min/mmHg/L
DLCO COR % PRED: 92 %
DLCO UNC: 29.74 ml/min/mmHg
DLCO cor: 29.41 ml/min/mmHg
DLCO unc % pred: 93 %
FEF 25-75 Post: 2.3 L/sec
FEF 25-75 Pre: 1.71 L/sec
FEF2575-%Change-Post: 34 %
FEF2575-%PRED-POST: 92 %
FEF2575-%Pred-Pre: 69 %
FEV1-%CHANGE-POST: 7 %
FEV1-%PRED-POST: 82 %
FEV1-%Pred-Pre: 76 %
FEV1-PRE: 2.49 L
FEV1-Post: 2.67 L
FEV1FVC-%Change-Post: 1 %
FEV1FVC-%Pred-Pre: 98 %
FEV6-%Change-Post: 5 %
FEV6-%PRED-POST: 86 %
FEV6-%Pred-Pre: 81 %
FEV6-POST: 3.56 L
FEV6-PRE: 3.37 L
FEV6FVC-%CHANGE-POST: 0 %
FEV6FVC-%PRED-PRE: 105 %
FEV6FVC-%Pred-Post: 104 %
FVC-%CHANGE-POST: 5 %
FVC-%PRED-POST: 82 %
FVC-%PRED-PRE: 77 %
FVC-POST: 3.61 L
FVC-PRE: 3.41 L
PRE FEV6/FVC RATIO: 99 %
Post FEV1/FVC ratio: 74 %
Post FEV6/FVC ratio: 99 %
Pre FEV1/FVC ratio: 73 %
RV % PRED: 120 %
RV: 2.88 L
TLC % pred: 91 %
TLC: 6.31 L

## 2017-04-22 NOTE — Progress Notes (Signed)
PFT done today by Lindsay Lemons, CMA  

## 2017-04-23 ENCOUNTER — Telehealth: Payer: Self-pay | Admitting: Pulmonary Disease

## 2017-04-23 NOTE — Telephone Encounter (Signed)
Notes recorded by Javier Glazier, MD on 04/21/2017 at 5:45 PM EDT Please let the patient know that his echocardiogram was essentially normal. We will review this at his follow-up appointment. Thank you. ----------------- Spoke with pt, aware of results/recs.  Nothing further needed.

## 2017-05-01 ENCOUNTER — Ambulatory Visit: Payer: Medicare Other

## 2017-05-06 ENCOUNTER — Other Ambulatory Visit (INDEPENDENT_AMBULATORY_CARE_PROVIDER_SITE_OTHER): Payer: Medicare Other

## 2017-05-06 ENCOUNTER — Ambulatory Visit: Payer: Medicare Other | Admitting: Pulmonary Disease

## 2017-05-06 ENCOUNTER — Encounter: Payer: Self-pay | Admitting: Pulmonary Disease

## 2017-05-06 VITALS — BP 124/72 | HR 62 | Ht 69.5 in | Wt 193.5 lb

## 2017-05-06 DIAGNOSIS — G2581 Restless legs syndrome: Secondary | ICD-10-CM | POA: Diagnosis not present

## 2017-05-06 DIAGNOSIS — R590 Localized enlarged lymph nodes: Secondary | ICD-10-CM

## 2017-05-06 DIAGNOSIS — R0600 Dyspnea, unspecified: Secondary | ICD-10-CM

## 2017-05-06 LAB — CBC WITH DIFFERENTIAL/PLATELET
BASOS PCT: 0.5 % (ref 0.0–3.0)
Basophils Absolute: 0 10*3/uL (ref 0.0–0.1)
EOS PCT: 0.4 % (ref 0.0–5.0)
Eosinophils Absolute: 0 10*3/uL (ref 0.0–0.7)
HEMATOCRIT: 45.9 % (ref 39.0–52.0)
HEMOGLOBIN: 15.5 g/dL (ref 13.0–17.0)
LYMPHS PCT: 13.5 % (ref 12.0–46.0)
Lymphs Abs: 1.1 10*3/uL (ref 0.7–4.0)
MCHC: 33.7 g/dL (ref 30.0–36.0)
MCV: 91 fl (ref 78.0–100.0)
MONOS PCT: 6.2 % (ref 3.0–12.0)
Monocytes Absolute: 0.5 10*3/uL (ref 0.1–1.0)
Neutro Abs: 6.5 10*3/uL (ref 1.4–7.7)
Neutrophils Relative %: 79.4 % — ABNORMAL HIGH (ref 43.0–77.0)
Platelets: 220 10*3/uL (ref 150.0–400.0)
RBC: 5.04 Mil/uL (ref 4.22–5.81)
RDW: 13.3 % (ref 11.5–15.5)
WBC: 8.2 10*3/uL (ref 4.0–10.5)

## 2017-05-06 NOTE — Progress Notes (Signed)
Subjective:    Patient ID: Theodore Davis, male    DOB: 02-07-1946, 71 y.o.   MRN: [id number]  C.C.:  Follow-up for Dyspnea & Mediastinal Adenopathy.  HPI Dyspnea:  He reports his dyspnea is unchanged. This only occurs first thing in the morning when laying recumbent. Denies any coughing or wheezing. Previously was attributing this to the start of his Rapaflo.   Mediastinal adenopathy: Noted on CT angiogram of the chest 04/07/17. Ordered CBC and LDH at last appointment which was not completed. Planned for repeat CT imaging of the chest in 3 months. No swelling in his neck, armpits or groin.   Review of Systems No chest pain or tightness. No fever or chills. Does have sweats sometimes at night. No abdominal pain, nausea or emesis. Minimal snoring. No witnessed apneas. He does have some early morning headaches. Naps frequently. Does doze off easily. Also has restless leg syndrome. He is sleeping  Allergies  Allergen Reactions  . Codeine Nausea And Vomiting  . Lipitor [Atorvastatin Calcium] Other (See Comments)    myalgias    Current Outpatient Medications on File Prior to Visit  Medication Sig Dispense Refill  . magnesium oxide (MAG-OX) 400 MG tablet Take 400 mg by mouth daily.     . mirtazapine (REMERON) 15 MG tablet Take 0.5 tablets (7.5 mg total) by mouth at bedtime. 15 tablet 3  . RAPAFLO 8 MG CAPS capsule Take 1 capsule (8 mg total) by mouth daily with breakfast. 90 capsule 3  . simvastatin (ZOCOR) 20 MG tablet TAKE 1 TABLET NIGHTLY AT BEDTIME 90 tablet 1   No current facility-administered medications on file prior to visit.     Past Medical History:  Diagnosis Date  . Arthritis 10/03/2010   right sternoclavicular joint  . Heart murmur   . Hemangioma of spine    lower lumbar  . HOH (hard of hearing)    R ear  . Hyperlipidemia    hypercholesterolemia  . Lyme disease     Past Surgical History:  Procedure Laterality Date  . CARDIOVASCULAR STRESS TEST  08/2010  .  CHOLECYSTECTOMY    . HERNIA REPAIR     R inguinal  . JOINT REPLACEMENT Right   . MASTOID DEBRIDEMENT     R ear  . TENDON REPAIR  2005   R elbow  . US ECHOCARDIOGRAPHY  08/2010    Family History  Problem Relation Age of Onset  . Heart disease Mother   . Atrial fibrillation Mother   . Heart disease Father   . Rheum arthritis Father   . Bladder Cancer Father   . Atrial fibrillation Father   . Brain cancer Maternal Uncle   . Lung disease Neg Hx     Social History   Socioeconomic History  . Marital status: Married    Spouse name: None  . Number of children: None  . Years of education: None  . Highest education level: None  Social Needs  . Financial resource strain: None  . Food insecurity - worry: None  . Food insecurity - inability: None  . Transportation needs - medical: None  . Transportation needs - non-medical: None  Occupational History  . None  Tobacco Use  . Smoking status: Former Smoker    Packs/day: 0.25    Years: 50.00    Pack years: 12.50    Last attempt to quit: 06/23/1997    Years since quitting: 19.8  . Smokeless tobacco: Former Network engineer and Sexual Activity  .  Alcohol use: Yes    Comment: occasional beer  . Drug use: No  . Sexual activity: Yes  Other Topics Concern  . None  Social History Narrative   [facility] [place] [facility] (04/08/17):   Originally from [facility]. Has lived in [place] as well. Previously worked for the Education officer, environmental as a Dealer. Has 2 dogs & a rabbit currently. No bird exposure. He reports black mold was found in his air conditioner at [facility] when it was changed out 2 years ago. No hot tub exposure. Enjoys fishing.       Objective:   Physical Exam BP 124/72 (BP Location: Left Arm, Cuff Size: Normal)   Pulse 62   Ht 5' 9.5" (1.765 m)   Wt 193 lb 8 oz (87.8 kg)   SpO2 97%   BMI 28.17 kg/m   General:  Awake. Mild central obesity. No distress.  Integument:  No rash. Warm. Dry. Extremities:  No cyanosis or clubbing.  HEENT:   Moist mucus membranes. Minimal nasal turbinate swelling. Mallampati class II-III. Neck circumference 16in.  Cardiovascular:  Regular rate. No edema. Regular rhythm.  Pulmonary:  Clear bilaterally to auscultation. Normal work of breathing on room air. Abdomen: Soft. Normal bowel sounds. Mildly protuberant. Musculoskeletal:  Normal bulk and tone. No joint deformity or effusion appreciated. Neurological:  Cranial nerves 2-12 grossly in tact. No meningismus. Moving all 4 extremities equally.   PFT 04/22/17: FVC 3.41 L (77%) FEV1 2.49 L (76%) FEV1/FVC 0.73 FEF 25-75 1.71 L (69%) negative bronchodilator response TLC 6.31 L (91%) RV 120% ERV 73% DLCO corrected 92%  6MWT 04/21/17: 402 meters / Baseline Sat 100% on RA / Nadir Sat 100% on RA  Epworth (05/06/17):  6  IMAGING CTA CHEST 04/07/17 (previously reviewed by me):  There is some suggestion of mosaic attenuation. No parenchymal mass, nodule, or opacity appreciated. No pleural effusion or thickening. No pericardial effusion. Multiple mediastinal lymph nodes are noted. Radiology commented upon a lymph node that was precarinal measuring 1.2 cm. I was able to find a 1.1 cm lymph node measuring in the short axis on my review. Otherwise, all the other lymph nodes appear to be borderline but not pathologically enlarged. There are no other CT images available for my comparison.   CARDIAC TTE (04/21/17):  LV normal in size with EF 55-60%. Normal wall motion. Grade 1 diastolic dysfunction. LA & RA normal in size. RV normal in size and function. No aortic stenosis or regurgitation. Aortic root normal in size. Trivial mitral regurgitation without stenosis. No pulmonic stenosis or regurgitation. No tricuspid regurgitation. No pericardial effusion.  TTE (05/15/15):  LV normal in size with EF 55-60%. No regional wall motion abnormalities & grade 1 diastolic dysfunction. LA & RA normal in size. RV normal in size and function. Pulmonary artery systolic pressure 18  mmHg. No aortic stenosis or regurgitation. Aortic root normal in size. Trivial mitral regurgitation. No significant pulmonic regurgitation. Trivial tricuspid regurgitation. No pericardial effusion.  LABS 04/07/17: CBC: 5.3/15.7/44.7/209 BMP: 139/4.2/107/26/9/1.08/123/8.8    Assessment & Plan:  71 y.o. male with continued dyspnea primarily with laying recumbent and noted first thing in the morning. I reviewed his pulmonary function testing with him today which shows normal spirometry, lung volumes, and carbon monoxide diffusion capacity. He has no evidence of a significant desaturation or oxygen requirement with his walk test. Additionally, his previous echocardiogram was essentially normal. He continues to exhibit no symptoms that would be suggestive of underlying lymphoma or malignancy. He is planned for repeat CT  imaging of his chest in 2-3 months to ensure that his mediastinal adenopathy is not progressing. With his history of restless leg syndrome and excessive daytime napping as well as fatigue I do question whether or not he may be experiencing sleep apnea along with his restless leg syndrome. As such, I am checking a polysomnogram. I instructed the patient to contact my office if he had any new breathing problems or questions before his next appointment.  1. Dyspnea/restless leg syndrome: Suspect underlying sleep apnea. Checking polysomnogram. 2. Mediastinal adenopathy: CBC with differential & LDH today. Plan for repeat CT chest without contrast 3 months from previous. 3. Health maintenance: Status post Prevnar April 2015, Pneumovax May 2016, & Tdap April 2012. 4. Follow-up: Return to clinic in 3 months or sooner if needed.  Sonia Baller Ashok Cordia, M.D. [facility] Pulmonary & Critical Care Pager:  [phone] After 7pm or if no response, call [phone] 12:18 PM 05/06/17

## 2017-05-06 NOTE — Patient Instructions (Signed)
   Call my office if you have any questions or concerns before your next appointment.  We will see you back after your sleep test and CT scan.  TESTS ORDERED: 1. Polysomnogram 2. Serum CBC with differential & LDH today

## 2017-05-18 ENCOUNTER — Ambulatory Visit (HOSPITAL_BASED_OUTPATIENT_CLINIC_OR_DEPARTMENT_OTHER): Payer: Medicare Other | Attending: Pulmonary Disease | Admitting: Pulmonary Disease

## 2017-05-18 VITALS — Ht 69.0 in | Wt 194.0 lb

## 2017-05-18 DIAGNOSIS — G2581 Restless legs syndrome: Secondary | ICD-10-CM

## 2017-05-18 DIAGNOSIS — G4761 Periodic limb movement disorder: Secondary | ICD-10-CM | POA: Diagnosis not present

## 2017-05-18 DIAGNOSIS — I493 Ventricular premature depolarization: Secondary | ICD-10-CM | POA: Diagnosis not present

## 2017-05-18 DIAGNOSIS — R0683 Snoring: Secondary | ICD-10-CM | POA: Insufficient documentation

## 2017-05-18 DIAGNOSIS — Z79899 Other long term (current) drug therapy: Secondary | ICD-10-CM | POA: Insufficient documentation

## 2017-05-23 DIAGNOSIS — R0683 Snoring: Secondary | ICD-10-CM | POA: Insufficient documentation

## 2017-05-23 DIAGNOSIS — G2581 Restless legs syndrome: Secondary | ICD-10-CM

## 2017-05-23 NOTE — Procedures (Signed)
   Patient Name: Theodore Davis, Theodore Davis Date: 05/18/2017 Gender: Male D.O.B: [birth year] Age (years): 71 Referring Provider: Javier Glazier Height (inches): 63 Interpreting Physician: Chesley Mires MD, ABSM Weight (lbs): 194 RPSGT: Madelon Lips BMI: 29 MRN: [id number] Neck Size: 16.00  CLINICAL INFORMATION Sleep Study Type: NPSG  Indication for sleep study: 71 yr old male with snoring, restless legs and concern for sleep apnea.  Epworth Sleepiness Score: 8  SLEEP STUDY TECHNIQUE As per the AASM Manual for the Scoring of Sleep and Associated Events v2.3 (April 2016) with a hypopnea requiring 4% desaturations.  The channels recorded and monitored were frontal, central and occipital EEG, electrooculogram (EOG), submentalis EMG (chin), nasal and oral airflow, thoracic and abdominal wall motion, anterior tibialis EMG, snore microphone, electrocardiogram, and pulse oximetry.  MEDICATIONS Medications self-administered by patient taken the night of the study : SIMVASTATIN  SLEEP ARCHITECTURE The study was initiated at 10:41:44 PM and ended at 4:43:45 AM.  Sleep onset time was 5.5 minutes and the sleep efficiency was 89.4%. The total sleep time was 323.5 minutes.  Stage REM latency was 85.0 minutes.  The patient spent 8.35% of the night in stage N1 sleep, 73.56% in stage N2 sleep, 1.55% in stage N3 and 16.54% in REM.  Alpha intrusion was absent.  Supine sleep was 25.35%.  RESPIRATORY PARAMETERS The overall apnea/hypopnea index (AHI) was 1.5 per hour. There were 0 total apneas, including 0 obstructive, 0 central and 0 mixed apneas. There were 8 hypopneas and 5 RERAs.  The AHI during Stage REM sleep was 4.5 per hour.  AHI while supine was 5.1 per hour.  The mean oxygen saturation was 94.31%. The minimum SpO2 during sleep was 91.00%.  loud snoring was noted during this study.  CARDIAC DATA The 2 lead EKG demonstrated sinus rhythm. The mean heart rate was 66.93 beats per  minute. Other EKG findings include: PVCs.  LEG MOVEMENT DATA The total PLMS were 0 with a resulting PLMS index of 0.00. Associated arousal with leg movement index was 0.0 .  IMPRESSIONS - While he had a few obstructive respiratory events, these were not frequent enough to qualify for a diagnosis of obstructive sleep apnea.  His overall AHI was 1.5 with an SpO2 low of 91%. - He did not have a significant increase in his periodic limb movement index. - Sleep architecture was unremarkable.  DIAGNOSIS - Snoring (R06.83)  RECOMMENDATIONS - Avoid alcohol, sedatives and other CNS depressants that may worsen sleep apnea and disrupt normal sleep architecture. - Sleep hygiene should be reviewed to assess factors that may improve sleep quality. - Weight management and regular exercise should be initiated or continued if appropriate.  [Electronically signed] 05/23/2017 02:25 PM  Chesley Mires MD, [place], American Board of Sleep Medicine   NPI: [id number]

## 2017-05-25 DIAGNOSIS — Z974 Presence of external hearing-aid: Secondary | ICD-10-CM | POA: Diagnosis not present

## 2017-05-25 DIAGNOSIS — H9190 Unspecified hearing loss, unspecified ear: Secondary | ICD-10-CM | POA: Diagnosis not present

## 2017-05-25 DIAGNOSIS — Z7982 Long term (current) use of aspirin: Secondary | ICD-10-CM | POA: Diagnosis not present

## 2017-05-25 DIAGNOSIS — H6121 Impacted cerumen, right ear: Secondary | ICD-10-CM | POA: Diagnosis not present

## 2017-05-25 DIAGNOSIS — H90A31 Mixed conductive and sensorineural hearing loss, unilateral, right ear with restricted hearing on the contralateral side: Secondary | ICD-10-CM | POA: Diagnosis not present

## 2017-06-19 ENCOUNTER — Encounter (HOSPITAL_BASED_OUTPATIENT_CLINIC_OR_DEPARTMENT_OTHER): Payer: Medicare Other

## 2017-07-06 ENCOUNTER — Other Ambulatory Visit: Payer: Self-pay | Admitting: Family Medicine

## 2017-07-07 ENCOUNTER — Telehealth: Payer: Self-pay | Admitting: Pulmonary Disease

## 2017-07-07 DIAGNOSIS — R590 Localized enlarged lymph nodes: Secondary | ICD-10-CM

## 2017-07-07 NOTE — Telephone Encounter (Signed)
Order has been updated. Nothing further was needed. 

## 2017-07-10 ENCOUNTER — Inpatient Hospital Stay: Admission: RE | Admit: 2017-07-10 | Payer: Medicare Other | Source: Ambulatory Visit

## 2017-07-10 ENCOUNTER — Ambulatory Visit (INDEPENDENT_AMBULATORY_CARE_PROVIDER_SITE_OTHER)
Admission: RE | Admit: 2017-07-10 | Discharge: 2017-07-10 | Disposition: A | Discharge status: Home / Self Care | Payer: Medicare Other | Source: Ambulatory Visit | Attending: Pulmonary Disease | Admitting: Pulmonary Disease

## 2017-07-10 DIAGNOSIS — R590 Localized enlarged lymph nodes: Secondary | ICD-10-CM

## 2017-08-05 ENCOUNTER — Encounter: Payer: Self-pay | Admitting: Pulmonary Disease

## 2017-08-05 ENCOUNTER — Ambulatory Visit: Payer: Medicare Other | Admitting: Pulmonary Disease

## 2017-08-05 VITALS — BP 120/68 | HR 66 | Ht 69.5 in | Wt 199.0 lb

## 2017-08-05 DIAGNOSIS — R590 Localized enlarged lymph nodes: Secondary | ICD-10-CM | POA: Diagnosis not present

## 2017-08-05 DIAGNOSIS — R0600 Dyspnea, unspecified: Secondary | ICD-10-CM

## 2017-08-05 NOTE — Progress Notes (Signed)
Synopsis: former patient of Dr. Ashok Cordia who has some dyspnea in the mornings whe he lay on his left side.  Subjective:   PATIENT ID: Theodore Davis GENDER: male DOB: 1946/02/03, MRN: [id number]   HPI  Chief Complaint  Patient presents with  . Follow-up    former JN pt being treated for dyspnea, mediastinal adenopathy.  Pt c/o sob worse when laying on L side qam, baseline sinus congestion.      He still has some dyspnea when he lay on his left side > it is a little better > has been a problem for a year > he thinks that this is anxiety related > he says that sometimes he sits around thinking about things too much > he is not exercising as much as normal, though when he walks 2 miles he feels totally fine without dyspnea  Past Medical History:  Diagnosis Date  . Arthritis 10/03/2010   right sternoclavicular joint  . Heart murmur   . Hemangioma of spine    lower lumbar  . HOH (hard of hearing)    R ear  . Hyperlipidemia    hypercholesterolemia  . Lyme disease      Family History  Problem Relation Age of Onset  . Heart disease Mother   . Atrial fibrillation Mother   . Heart disease Father   . Rheum arthritis Father   . Bladder Cancer Father   . Atrial fibrillation Father   . Brain cancer Maternal Uncle   . Lung disease Neg Hx      Social History   Socioeconomic History  . Marital status: Married    Spouse name: Not on file  . Number of children: Not on file  . Years of education: Not on file  . Highest education level: Not on file  Social Needs  . Financial resource strain: Not on file  . Food insecurity - worry: Not on file  . Food insecurity - inability: Not on file  . Transportation needs - medical: Not on file  . Transportation needs - non-medical: Not on file  Occupational History  . Not on file  Tobacco Use  . Smoking status: Former Smoker    Packs/day: 0.25    Years: 50.00    Pack years: 12.50    Last attempt to quit: 06/23/1997    Years since  quitting: 20.1  . Smokeless tobacco: Former Network engineer and Sexual Activity  . Alcohol use: Yes    Comment: occasional beer  . Drug use: No  . Sexual activity: Yes  Other Topics Concern  . Not on file  Social History Narrative   [facility] (04/08/17):   Originally from [facility]. Has lived in [place] as well. Previously worked for the Education officer, environmental as a Dealer. Has 2 dogs & a rabbit currently. No bird exposure. He reports black mold was found in his air conditioner at [facility] when it was changed out 2 years ago. No hot tub exposure. Enjoys fishing.      Allergies  Allergen Reactions  . Codeine Nausea And Vomiting  . Lipitor [Atorvastatin Calcium] Other (See Comments)    myalgias     Outpatient Medications Prior to Visit  Medication Sig Dispense Refill  . magnesium oxide (MAG-OX) 400 MG tablet Take 400 mg by mouth daily.     Marland Kitchen RAPAFLO 8 MG CAPS capsule Take 1 capsule (8 mg total) by mouth daily with breakfast. 90 capsule 3  . simvastatin (ZOCOR) 20 MG tablet  TAKE 1 TABLET BY MOUTH  NIGHTLY AT BEDTIME 90 tablet 1  . mirtazapine (REMERON) 15 MG tablet Take 0.5 tablets (7.5 mg total) by mouth at bedtime. (Patient not taking: Reported on 08/05/2017) 15 tablet 3   No facility-administered medications prior to visit.     ROS    Objective:  Physical Exam   Vitals:   08/05/17 0942  BP: 120/68  Pulse: 66  SpO2: 97%  Weight: 199 lb (90.3 kg)  Height: 5' 9.5" (1.765 m)    Gen: well appearing HENT: OP clear, TM's clear, neck supple PULM: CTA B, normal percussion CV: RRR, no mgr, trace edema GI: BS+, soft, nontender Derm: no cyanosis or rash Psyche: normal mood and affect   CBC    Component Value Date/Time   WBC 8.2 05/06/2017 1247   RBC 5.04 05/06/2017 1247   HGB 15.5 05/06/2017 1247   HCT 45.9 05/06/2017 1247   PLT 220.0 05/06/2017 1247   MCV 91.0 05/06/2017 1247   MCH 31.3 04/07/2017 0846   MCHC 33.7 05/06/2017 1247   RDW 13.3 05/06/2017 1247    LYMPHSABS 1.1 05/06/2017 1247   MONOABS 0.5 05/06/2017 1247   EOSABS 0.0 05/06/2017 1247   BASOSABS 0.0 05/06/2017 1247     IMAGING CTA CHEST 04/07/17:  There is some suggestion of mosaic attenuation. No parenchymal mass, nodule, or opacity appreciated. No pleural effusion or thickening. No pericardial effusion. Multiple mediastinal lymph nodes are noted. Radiology commented upon a lymph node that was precarinal measuring 1.2 cm. I was able to find a 1.1 cm lymph node measuring in the short axis on my review. Otherwise, all the other lymph nodes appear to be borderline but not pathologically enlarged. There are no other CT images available for my comparison.   January 2018 CT chest images independently reviewed showing normal pulmonary parenchyma, stable mediastinal lymphadenopathy  October 2018 pulmonary function test: Ratio 74%, FEV1 2.67 L 82% predicted, FVC 3.61 L 82% predicted, total lung capacity 6.31 L 91% predicted, DLCO 29.74 93% pred  CARDIAC TTE (05/15/15):  LV normal in size with EF 55-60%. No regional wall motion abnormalities & grade 1 diastolic dysfunction. LA & RA normal in size. RV normal in size and function. Pulmonary artery systolic pressure 18 mmHg. No aortic stenosis or regurgitation. Aortic root normal in size. Trivial mitral regurgitation. No significant pulmonic regurgitation. Trivial tricuspid regurgitation. No pericardial effusion.  Transthoracic echocardiogram 2018: LVEF 55-60%, is no valvular abnormality  LABS 04/07/17: CBC: 5.3/15.7/44.7/209 BMP: 139/4.2/107/26/9/1.08/123/8.8      Assessment & Plan:   Mediastinal adenopathy  Dyspnea, unspecified type  Discussion: Kamar has some mild shortness of breath while laying flat in the mornings only on his left side,.  Objectively there is nothing wrong.  His lungs are clear to exam, his CT scan of his chest was within normal limits with the exception of some mildly enlarged lymph nodes which are  heterogeneous on my personal review, his echocardiogram is normal, and his lung function testing is normal.  So at this time I see no evidence of an underlying cardiac or pulmonary abnormality.  We talked about repeating a CT scan to follow-up the mediastinal lymph nodes again but he refused.  Plan: For shortness of breath: I agree with you that this is likely related to anxiety Exercise more frequently If the problem worsens let me know  Increased size of lymph nodes in your chest: For now we will hold off on further imaging Let me know if you have  any problems with cough weight loss or chest congestion  We will see you back on an as-needed basis    Current Outpatient Medications:  .  magnesium oxide (MAG-OX) 400 MG tablet, Take 400 mg by mouth daily. , Disp: , Rfl:  .  RAPAFLO 8 MG CAPS capsule, Take 1 capsule (8 mg total) by mouth daily with breakfast., Disp: 90 capsule, Rfl: 3 .  simvastatin (ZOCOR) 20 MG tablet, TAKE 1 TABLET BY MOUTH  NIGHTLY AT BEDTIME, Disp: 90 tablet, Rfl: 1

## 2017-08-05 NOTE — Patient Instructions (Signed)
For shortness of breath: I agree with you that this is likely related to anxiety Exercise more frequently If the problem worsens let me know  Increased size of lymph nodes in your chest: For now we will hold off on further imaging Let me know if you have any problems with cough weight loss or chest congestion  We will see you back on an as-needed basis

## 2017-08-24 DIAGNOSIS — H2513 Age-related nuclear cataract, bilateral: Secondary | ICD-10-CM | POA: Diagnosis not present

## 2017-08-24 DIAGNOSIS — H524 Presbyopia: Secondary | ICD-10-CM | POA: Diagnosis not present

## 2017-08-24 DIAGNOSIS — H5203 Hypermetropia, bilateral: Secondary | ICD-10-CM | POA: Diagnosis not present

## 2017-08-24 DIAGNOSIS — H52203 Unspecified astigmatism, bilateral: Secondary | ICD-10-CM | POA: Diagnosis not present

## 2018-01-11 ENCOUNTER — Telehealth: Payer: Self-pay | Admitting: Family Medicine

## 2018-01-11 NOTE — Telephone Encounter (Signed)
Pt needs refill on simvastatin [facility].

## 2018-01-13 ENCOUNTER — Encounter: Payer: Self-pay | Admitting: Family Medicine

## 2018-01-13 ENCOUNTER — Ambulatory Visit (INDEPENDENT_AMBULATORY_CARE_PROVIDER_SITE_OTHER): Payer: Medicare Other | Admitting: Family Medicine

## 2018-01-13 VITALS — BP 138/80 | HR 66 | Temp 97.9°F | Resp 15 | Ht 69.0 in | Wt 191.0 lb

## 2018-01-13 DIAGNOSIS — Z1159 Encounter for screening for other viral diseases: Secondary | ICD-10-CM

## 2018-01-13 DIAGNOSIS — N400 Enlarged prostate without lower urinary tract symptoms: Secondary | ICD-10-CM | POA: Diagnosis not present

## 2018-01-13 DIAGNOSIS — I10 Essential (primary) hypertension: Secondary | ICD-10-CM | POA: Diagnosis not present

## 2018-01-13 DIAGNOSIS — Z125 Encounter for screening for malignant neoplasm of prostate: Secondary | ICD-10-CM

## 2018-01-13 DIAGNOSIS — Z0000 Encounter for general adult medical examination without abnormal findings: Secondary | ICD-10-CM | POA: Diagnosis not present

## 2018-01-13 DIAGNOSIS — E785 Hyperlipidemia, unspecified: Secondary | ICD-10-CM

## 2018-01-13 DIAGNOSIS — Z23 Encounter for immunization: Secondary | ICD-10-CM

## 2018-01-13 DIAGNOSIS — R7309 Other abnormal glucose: Secondary | ICD-10-CM | POA: Diagnosis not present

## 2018-01-13 DIAGNOSIS — E663 Overweight: Secondary | ICD-10-CM

## 2018-01-13 MED ORDER — SIMVASTATIN 20 MG PO TABS: ORAL_TABLET | ORAL | 3 refills | Status: DC

## 2018-01-13 MED ORDER — ZOSTER VAC RECOMB ADJUVANTED 50 MCG/0.5ML IM SUSR: 0.5000 mL | Freq: Once | INTRAMUSCULAR | 0 refills | Status: AC

## 2018-01-13 NOTE — Progress Notes (Signed)
Subjective:   Theodore Davis is a 72 y.o. male who presents for Medicare Annual/Subsequent preventive examination.  Review of Systems:  Review of Systems  Constitutional: Negative.  Negative for activity change, appetite change, chills, diaphoresis, fatigue, fever and unexpected weight change.  HENT: Negative.   Eyes: Negative.   Respiratory: Negative.  Negative for shortness of breath.   Cardiovascular: Negative.  Negative for chest pain, palpitations and leg swelling.  Gastrointestinal: Negative.  Negative for abdominal pain and blood in stool.  Endocrine: Negative.   Genitourinary: Negative.  Negative for decreased urine volume, difficulty urinating, testicular pain and urgency.  Musculoskeletal: Negative.   Skin: Negative.  Negative for color change and pallor.  Allergic/Immunologic: Negative.   Neurological: Negative.  Negative for syncope, weakness, light-headedness and numbness.  Psychiatric/Behavioral: Negative.  Negative for confusion, dysphoric mood, self-injury, sleep disturbance and suicidal ideas. The patient is not nervous/anxious.   All other systems reviewed and are negative.  Cardiac Risk Factors include: dyslipidemia;male gender;sedentary lifestyle;advanced age (>69men, >76 women)      Objective:    Vitals: BP 138/80   Pulse 66   Temp 97.9 F (36.6 C) (Oral)   Resp 15   Ht 5\' 9"  (1.753 m)   Wt 191 lb (86.6 kg)   SpO2 97%   BMI 28.21 kg/m   Body mass index is 28.21 kg/m.  Physical Exam  Constitutional: He is oriented to person, place, and time. He appears well-developed and well-nourished.  Non-toxic appearance. He does not appear ill. No distress.  HENT:  Head: Normocephalic and atraumatic.  Right Ear: Tympanic membrane, external ear and ear canal normal.  Left Ear: Tympanic membrane, external ear and ear canal normal.  Nose: Nose normal. No mucosal edema or rhinorrhea. Right sinus exhibits no maxillary sinus tenderness and no frontal sinus  tenderness. Left sinus exhibits no maxillary sinus tenderness and no frontal sinus tenderness.  Mouth/Throat: Uvula is midline and oropharynx is clear and moist. No trismus in the jaw. No uvula swelling. No oropharyngeal exudate, posterior oropharyngeal edema or posterior oropharyngeal erythema.  Eyes: Pupils are equal, round, and reactive to light. Conjunctivae, EOM and lids are normal. No scleral icterus.  Neck: Trachea normal, normal range of motion and phonation normal. Neck supple. No tracheal deviation present. No thyromegaly present.  Cardiovascular: Normal rate, regular rhythm, normal heart sounds and normal pulses. Exam reveals no gallop and no friction rub.  No murmur heard. Pulses:      Radial pulses are 2+ on the right side, and 2+ on the left side.       Posterior tibial pulses are 2+ on the right side, and 2+ on the left side.  Pulmonary/Chest: Effort normal and breath sounds normal. No stridor. No respiratory distress. He has no wheezes. He has no rhonchi. He has no rales.  Abdominal: Soft. Normal appearance and bowel sounds are normal. He exhibits no distension. There is no tenderness. There is no rebound and no guarding.  Musculoskeletal: Normal range of motion. He exhibits no edema.  Neurological: He is alert and oriented to person, place, and time. He exhibits normal muscle tone. Coordination and gait normal.  Skin: Skin is warm, dry and intact. Capillary refill takes less than 2 seconds. No rash noted. He is not diaphoretic.  Psychiatric: He has a normal mood and affect. His speech is normal and behavior is normal.  Nursing note and vitals reviewed.    Advanced directives - copy requested, pt states he already has done  Advanced Directives 05/18/2017 04/07/2017 11/30/2014 11/28/2014 05/28/2011 05/20/2011  Does Patient Have a Medical Advance Directive? Yes Yes Yes Yes - Patient does not have advance directive;Patient would not like information  Type of Advance Directive Living  will [place];Living will [place];Living will [place];Living will - -  Does patient want to make changes to medical advance directive? Yes (Inpatient - patient defers changing a medical advance directive at this time) - - - - -  Copy of [place] in Chart? - No - copy requested No - copy requested - - -  Would patient like information on creating a medical advance directive? - No - Patient declined - - - -  Pre-existing out of facility DNR order (yellow form or pink MOST form) - - - - No No    Tobacco Social History   Tobacco Use  Smoking Status Former Smoker  . Packs/day: 0.25  . Years: 50.00  . Pack years: 12.50  . Last attempt to quit: 06/23/1997  . Years since quitting: 20.5  Smokeless Tobacco Former Engineer, structural given: Not Answered N/A, former smoker  Clinical Intake:  Pre-visit preparation completed: No  Pain : No/denies pain     BMI - recorded: 28.2 Nutritional Status: BMI 25 -29 Overweight Nutritional Risks: None Diabetes: No Current [facility] habits: [facility] but [facility] stressers caused him to stop doing that, he does go walking at the mall. Dietary issues discussed:  No particular diet issues/denies any dietary restrictions, on simvastatin for cholesterol        Interpreter Needed?: No     Past Medical History:  Diagnosis Date  . Arthritis 10/03/2010   right sternoclavicular joint  . Heart murmur   . Hemangioma of spine    lower lumbar  . HOH (hard of hearing)    R ear  . Hyperlipidemia    hypercholesterolemia  . Lyme disease    Past Surgical History:  Procedure Laterality Date  . CARDIOVASCULAR STRESS TEST  08/2010  . CHOLECYSTECTOMY    . HERNIA REPAIR     R inguinal  . JOINT REPLACEMENT Right   . MASTOID DEBRIDEMENT     R ear  . RESECTION DISTAL CLAVICAL Left 11/30/2014   Procedure: RESECTION DISTAL CLAVICAL;  Surgeon: Kathryne Hitch, MD;  Location: [facility];  Service: Orthopedics;  Laterality: Left;  . SHOULDER ARTHROSCOPY WITH BICEPSTENOTOMY Left 11/30/2014   Procedure: SHOULDER ARTHROSCOPY WITH BICEPSTENOTOMY;  Surgeon: Kathryne Hitch, MD;  Location: [facility];  Service: Orthopedics;  Laterality: Left;  . SHOULDER ARTHROSCOPY WITH ROTATOR CUFF REPAIR AND SUBACROMIAL DECOMPRESSION Left 11/30/2014   Procedure: LEFT SHOULDER ARTHROSCOPY WITH DISTAL CLAVICAL EXCISION, ACROMIOPLASTY, ROTATOR CUFF REPAIR , BICEPS TENOTOMY;  Surgeon: Kathryne Hitch, MD;  Location: [facility];  Service: Orthopedics;  Laterality: Left;  . TENDON REPAIR  2005   R elbow  . TOTAL KNEE ARTHROPLASTY  05/28/2011   Procedure: TOTAL KNEE ARTHROPLASTY;  Surgeon: Ninetta Lights, MD;  Location: [facility];  Service: Orthopedics;  Laterality: Right;  Right total knee arthroplasty   . US ECHOCARDIOGRAPHY  08/2010   Family History  Problem Relation Age of Onset  . Heart disease Mother   . Atrial fibrillation Mother   . Heart disease Father   . Rheum arthritis Father   . Bladder Cancer Father   . Atrial fibrillation Father   . Brain cancer Maternal  Uncle   . Lung disease Neg Hx    Social History   Socioeconomic History  . Marital status: Married    Spouse name: Not on file  . Number of children: Not on file  . Years of education: Not on file  . Highest education level: Not on file  Occupational History  . Not on file  Social Needs  . Financial resource strain: Not on file  . Food insecurity:    Worry: Not on file    Inability: Not on file  . Transportation needs:    Medical: Not on file    Non-medical: Not on file  Tobacco Use  . Smoking status: Former Smoker    Packs/day: 0.25    Years: 50.00    Pack years: 12.50    Last attempt to quit: 06/23/1997    Years since quitting: 20.5  . Smokeless tobacco: Former Network engineer and Sexual Activity  . Alcohol use: Yes    Comment: occasional beer  .  Drug use: No  . Sexual activity: Yes  Lifestyle  . Physical activity:    Days per week: Not on file    Minutes per session: Not on file  . Stress: Not on file  Relationships  . Social connections:    Talks on phone: Not on file    Gets together: Not on file    Attends religious service: Not on file    Active member of club or organization: Not on file    Attends meetings of clubs or organizations: Not on file    Relationship status: Not on file  Other Topics Concern  . Not on file  Social History Narrative   [facility] [facility] (04/08/17):   Originally from [facility]. Has lived in [place] as well. Previously worked for the Education officer, environmental as a Dealer. Has 2 dogs & a rabbit currently. No bird exposure. He reports black mold was found in his air conditioner at [facility] when it was changed out 2 years ago. No hot tub exposure. Enjoys fishing.     Outpatient Encounter Medications as of 01/13/2018  Medication Sig  . magnesium oxide (MAG-OX) 400 MG tablet Take 400 mg by mouth daily.   Marland Kitchen RAPAFLO 8 MG CAPS capsule Take 1 capsule (8 mg total) by mouth daily with breakfast.  . simvastatin (ZOCOR) 20 MG tablet TAKE 1 TABLET BY MOUTH  NIGHTLY AT BEDTIME  . [DISCONTINUED] simvastatin (ZOCOR) 20 MG tablet TAKE 1 TABLET BY MOUTH  NIGHTLY AT BEDTIME  . [EXPIRED] Zoster Vaccine Adjuvanted [facility]) injection Inject 0.5 mLs into the muscle once for 1 dose.   No facility-administered encounter medications on file as of 01/13/2018.     Activities of Daily Living In your present state of health, do you have any difficulty performing the following activities: 01/13/2018 01/13/2018  Hearing? Y Y  Comment hearing aids, recently lost one -  Vision? N N  Difficulty concentrating or making decisions? N N  Walking or climbing stairs? N N  Dressing or bathing? N N  Doing errands, shopping? N N  Preparing Food and eating ? - N  Using the Toilet? - N  In the past six months, have you accidently leaked urine? - N    Do you have problems with loss of bowel control? - N  Managing your Medications? - N  Managing your Finances? - N  Housekeeping or managing your Housekeeping? - N  Some recent data might be hidden   List the Names  of Other Physician/Practitioners you currently use:  [place], he lost hearing aid recently, hearing worse.  Long history of ringing in ears, worked up in the past. Pulmonology - saw this past year [facility], no future appointments Urologist - [facility], pt cannot recall MD's name, they do manage BPH, he will see them in the fall  Indicate any recent Medical Services you may have received from other than [facility] providers in the past year (date may be approximate).    Pt denies   Patient Care Team: Susy Frizzle, MD as PCP - General (Family Medicine) Susy Frizzle, MD (Family Medicine)   Assessment:   This is a routine wellness examination for Theodore Davis.  Exercise Activities and Dietary recommendations Current Exercise Habits: The patient does not participate in regular exercise at present, Exercise limited by: Other - see comments(Has had intermittent respiratory sx, and family stressors that have limited previous exercise )  Goals    . Exercise 3x per week (30 min per time)     Increase vegetables, fruit, whole grains, lean meats.  Avoid fried foods, foods high in trans fat  Fall Risk Fall Risk  01/13/2018 12/29/2016 11/10/2016 10/18/2013  Falls in the past year? No No No No   Is the patient's home free of loose throw rugs in walkways, pet beds, electrical cords, etc?   yes      Grab bars in the bathroom? no      Handrails on the stairs?   yes      Adequate lighting?   yes   Depression Screen PHQ 2/9 Scores 01/13/2018 12/29/2016 11/10/2016 10/18/2013  PHQ - 2 Score 0 0 0 0  PHQ- 9 Score 0 - 6 -     Office Visit from 01/13/2018 in [place]  AUDIT-C Score  0      Cognitive Function  Alert? Yes Normal Appearance?Yes   Oriented to person? Yes Place? Yes  Time? Yes  Recall of three objects? Yes  Can perform simple calculations? Yes  Displays appropriate judgment?Yes  Can read the correct time from a watch face?Yes         Immunization History  Administered Date(s) Administered  . Influenza, High Dose Seasonal PF 04/04/2017  . Pneumococcal Conjugate-13 10/18/2013  . Pneumococcal Polysaccharide-23 10/24/2014  . Td 10/17/2010  . Tdap 10/17/2010    Qualifies for Shingles Vaccine? Yes - pt states not done in the past when ordered.  Sent to pharmacy - pt instructed to go to pharmacy to inquire about coverage and to get administered  Screening Tests Health Maintenance  Topic Date Due  . INFLUENZA VACCINE  01/21/2018  . COLONOSCOPY  06/23/2018  . TETANUS/TDAP  10/16/2020  . Hepatitis C Screening  Completed  . PNA vac Low Risk Adult  Completed   Cancer Screenings: Lung: Low Dose CT Chest recommended if Age 48-80 years, 30 pack-year currently smoking OR have quit w/in 15years. Patient does not qualify. Colorectal: Due 06/23/2018  Additional Screenings: Hepatitis C Screening: - indicated per DOB, ordered       Plan:       ICD-10-CM   1. Encounter for Medicare annual wellness exam Z00.00   2. Benign prostatic hyperplasia without lower urinary tract symptoms N40.0 PSA  3. Prostate cancer screening Z12.5 PSA  4. Hyperlipidemia LDL goal <70 E78.5 Lipid panel    CBC with Differential/Platelet    COMPLETE METABOLIC PANEL WITH GFR  5. Benign essential HTN I10 CBC with Differential/Platelet  COMPLETE METABOLIC PANEL WITH GFR  6. Overweight (BMI 25.0-29.9) E66.3 Lipid panel    CBC with Differential/Platelet    COMPLETE METABOLIC PANEL WITH GFR    Hemoglobin A1c  7. Need for hepatitis C screening test Z11.59 Hepatitis C antibody  8. Need for shingles vaccine Z23    Plan to monitor HLD BMI with OV/FLP q 6 months with PCP Pt declines nutritional/diet referral   BPH and prostate managed by  urologist  Pt to return in Sept/Oct for flu vaccine  I have personally reviewed and noted the following in the patient's chart:   . Medical and social history . Use of alcohol, tobacco or illicit drugs  . Current medications and supplements . Functional ability and status . Nutritional status . Physical activity . Advanced directives . List of other physicians . Hospitalizations, surgeries, and ER visits in previous 12 months . Vitals . Screenings to include cognitive, depression, and falls . Referrals and appointments  In addition, I have reviewed and discussed with patient certain preventive protocols, quality metrics, and best practice recommendations. A written personalized care plan for preventive services as well as general preventive health recommendations were provided to patient.     Delsa Grana, PA-C  01/18/2018

## 2018-01-13 NOTE — Patient Instructions (Addendum)
Theodore Davis , Thank you for taking time to come for your Medicare Wellness Visit. I appreciate your ongoing commitment to your health goals. Please review the following plan we discussed and let me know if I can assist you in the future.   These are the goals we discussed: Goals    . Exercise 3x per week (30 min per time)       This is a list of the screening recommended for you and due dates:  Health Maintenance  Topic Date Due  .  Hepatitis C: One time screening is recommended by Center for Disease Control  (CDC) for  adults born from [birth year] through 1963.   [birth year]  . Flu Shot  01/21/2018  . Colon Cancer Screening  06/23/2018  . Tetanus Vaccine  10/16/2020  . Pneumonia vaccines  Completed    Health Maintenance, Male A healthy lifestyle and preventive care is important for your health and wellness. Ask your health care provider about what schedule of regular examinations is right for you. What should I know about weight and diet? Eat a Healthy Diet  Eat plenty of vegetables, fruits, whole grains, low-fat dairy products, and lean protein.  Do not eat a lot of foods high in solid fats, added sugars, or salt.  Maintain a Healthy Weight Regular exercise can help you achieve or maintain a healthy weight. You should:  Do at least 150 minutes of exercise each week. The exercise should increase your heart rate and make you sweat (moderate-intensity exercise).  Do strength-training exercises at least twice a week.  Watch Your Levels of Cholesterol and Blood Lipids  Have your blood tested for lipids and cholesterol every 5 years starting at 70 years of age. If you are at high risk for heart disease, you should start having your blood tested when you are 70 years old. You may need to have your cholesterol levels checked more often if: ? Your lipid or cholesterol levels are high. ? You are older than 70 years of age. ? You are at high risk for heart disease.  What should I know about  cancer screening? Many types of cancers can be detected early and may often be prevented. Lung Cancer  You should be screened every year for lung cancer if: ? You are a current smoker who has smoked for at least 30 years. ? You are a former smoker who has quit within the past 15 years.  Talk to your health care provider about your screening options, when you should start screening, and how often you should be screened.  Colorectal Cancer  Routine colorectal cancer screening usually begins at 70 years of age and should be repeated every 5-10 years until you are 70 years old. You may need to be screened more often if early forms of precancerous polyps or small growths are found. Your health care provider may recommend screening at an earlier age if you have risk factors for colon cancer.  Your health care provider may recommend using home test kits to check for hidden blood in the stool.  A small camera at the end of a tube can be used to examine your colon (sigmoidoscopy or colonoscopy). This checks for the earliest forms of colorectal cancer.  Prostate and Testicular Cancer  Depending on your age and overall health, your health care provider may do certain tests to screen for prostate and testicular cancer.  Talk to your health care provider about any symptoms or concerns you have about testicular  or prostate cancer.  Skin Cancer  Check your skin from head to toe regularly.  Tell your health care provider about any new moles or changes in moles, especially if: ? There is a change in a mole's size, shape, or color. ? You have a mole that is larger than a pencil eraser.  Always use sunscreen. Apply sunscreen liberally and repeat throughout the day.  Protect yourself by wearing long sleeves, pants, a wide-brimmed hat, and sunglasses when outside.  What should I know about heart disease, diabetes, and high blood pressure?  If you are 20-24 years of age, have your blood pressure  checked every 3-5 years. If you are 60 years of age or older, have your blood pressure checked every year. You should have your blood pressure measured twice-once when you are at a hospital or clinic, and once when you are not at a hospital or clinic. Record the average of the two measurements. To check your blood pressure when you are not at a hospital or clinic, you can use: ? An automated blood pressure machine at a pharmacy. ? A home blood pressure monitor.  Talk to your health care provider about your target blood pressure.  If you are between 70-46 years old, ask your health care provider if you should take aspirin to prevent heart disease.  Have regular diabetes screenings by checking your fasting blood sugar level. ? If you are at a normal weight and have a low risk for diabetes, have this test once every three years after the age of 30. ? If you are overweight and have a high risk for diabetes, consider being tested at a younger age or more often.  A one-time screening for abdominal aortic aneurysm (AAA) by ultrasound is recommended for men aged 11-75 years who are current or former smokers. What should I know about preventing infection? Hepatitis B If you have a higher risk for hepatitis B, you should be screened for this virus. Talk with your health care provider to find out if you are at risk for hepatitis B infection. Hepatitis C Blood testing is recommended for:  Everyone born from [birth year] through 1963.  Anyone with known risk factors for hepatitis C.  Sexually Transmitted Diseases (STDs)  You should be screened each year for STDs including gonorrhea and chlamydia if: ? You are sexually active and are younger than 70 years of age. ? You are older than 70 years of age and your health care provider tells you that you are at risk for this type of infection. ? Your sexual activity has changed since you were last screened and you are at an increased risk for chlamydia or gonorrhea.  Ask your health care provider if you are at risk.  Talk with your health care provider about whether you are at high risk of being infected with HIV. Your health care provider may recommend a prescription medicine to help prevent HIV infection.  What else can I do?  Schedule regular health, dental, and eye exams.  Stay current with your vaccines (immunizations).  Do not use any tobacco products, such as cigarettes, chewing tobacco, and e-cigarettes. If you need help quitting, ask your health care provider.  Limit alcohol intake to no more than 2 drinks per day. One drink equals 12 ounces of beer, 5 ounces of wine, or 1 ounces of hard liquor.  Do not use street drugs.  Do not share needles.  Ask your health care provider for help if you need support  or information about quitting drugs.  Tell your health care provider if you often feel depressed.  Tell your health care provider if you have ever been abused or do not feel safe at home. This information is not intended to replace advice given to you by your health care provider. Make sure you discuss any questions you have with your health care provider. Document Released: 12/06/2007 Document Revised: 02/06/2016 Document Reviewed: 03/13/2015 Elsevier Interactive Patient Education  [employer].

## 2018-01-13 NOTE — Telephone Encounter (Signed)
Sent to pharm in [place] today

## 2018-01-14 LAB — LIPID PANEL
CHOL/HDL RATIO: 2.8 (calc) (ref <5.0)
CHOLESTEROL: 162 mg/dL (ref <200)
HDL: 58 mg/dL (ref >40)
LDL CHOLESTEROL (CALC): 83 mg/dL
NON-HDL CHOLESTEROL (CALC): 104 mg/dL (ref <130)
Triglycerides: 116 mg/dL (ref <150)

## 2018-01-14 LAB — CBC WITH DIFFERENTIAL/PLATELET
BASOS ABS: 41 {cells}/uL (ref 0–200)
BASOS PCT: 0.7 %
EOS ABS: 81 {cells}/uL (ref 15–500)
Eosinophils Relative: 1.4 %
HEMATOCRIT: 46.8 % (ref 38.5–50.0)
Hemoglobin: 16.1 g/dL (ref 13.2–17.1)
Lymphs Abs: 1027 cells/uL (ref 850–3900)
MCH: 30.4 pg (ref 27.0–33.0)
MCHC: 34.4 g/dL (ref 32.0–36.0)
MCV: 88.5 fL (ref 80.0–100.0)
MONOS PCT: 8.2 %
MPV: 11.4 fL (ref 7.5–12.5)
Neutro Abs: 4176 cells/uL (ref 1500–7800)
Neutrophils Relative %: 72 %
Platelets: 228 10*3/uL (ref 140–400)
RBC: 5.29 10*6/uL (ref 4.20–5.80)
RDW: 12.7 % (ref 11.0–15.0)
TOTAL LYMPHOCYTE: 17.7 %
WBC mixed population: 476 cells/uL (ref 200–950)
WBC: 5.8 10*3/uL (ref 3.8–10.8)

## 2018-01-14 LAB — HEMOGLOBIN A1C
EAG (MMOL/L): 6.2 (calc)
Hgb A1c MFr Bld: 5.5 % of total Hgb (ref <5.7)
Mean Plasma Glucose: 111 (calc)

## 2018-01-14 LAB — COMPLETE METABOLIC PANEL WITH GFR
AG Ratio: 1.8 (calc) (ref 1.0–2.5)
ALT: 13 U/L (ref 9–46)
AST: 16 U/L (ref 10–35)
Albumin: 4.2 g/dL (ref 3.6–5.1)
Alkaline phosphatase (APISO): 71 U/L (ref 40–115)
BUN: 11 mg/dL (ref 7–25)
CALCIUM: 9.2 mg/dL (ref 8.6–10.3)
CO2: 29 mmol/L (ref 20–32)
CREATININE: 0.94 mg/dL (ref 0.70–1.25)
Chloride: 103 mmol/L (ref 98–110)
GFR, EST AFRICAN AMERICAN: 95 mL/min/{1.73_m2} (ref >=60)
GFR, EST NON AFRICAN AMERICAN: 82 mL/min/{1.73_m2} (ref >=60)
GLOBULIN: 2.3 g/dL (ref 1.9–3.7)
Glucose, Bld: 115 mg/dL — ABNORMAL HIGH (ref 65–99)
Potassium: 5 mmol/L (ref 3.5–5.3)
SODIUM: 139 mmol/L (ref 135–146)
TOTAL PROTEIN: 6.5 g/dL (ref 6.1–8.1)
Total Bilirubin: 1.5 mg/dL — ABNORMAL HIGH (ref 0.2–1.2)

## 2018-01-14 LAB — HEPATITIS C ANTIBODY
HEP C AB: NONREACTIVE
SIGNAL TO CUT-OFF: 0.01 (ref <1.00)

## 2018-01-14 LAB — PSA: PSA: 0.5 ng/mL (ref <=4.0)

## 2018-01-18 ENCOUNTER — Encounter: Payer: Self-pay | Admitting: Family Medicine

## 2018-01-18 DIAGNOSIS — E663 Overweight: Secondary | ICD-10-CM | POA: Insufficient documentation

## 2018-01-18 DIAGNOSIS — N400 Enlarged prostate without lower urinary tract symptoms: Secondary | ICD-10-CM | POA: Insufficient documentation

## 2018-02-03 DIAGNOSIS — H90A31 Mixed conductive and sensorineural hearing loss, unilateral, right ear with restricted hearing on the contralateral side: Secondary | ICD-10-CM | POA: Diagnosis not present

## 2018-02-03 DIAGNOSIS — H90A22 Sensorineural hearing loss, unilateral, left ear, with restricted hearing on the contralateral side: Secondary | ICD-10-CM | POA: Diagnosis not present

## 2018-02-05 DIAGNOSIS — N401 Enlarged prostate with lower urinary tract symptoms: Secondary | ICD-10-CM | POA: Diagnosis not present

## 2018-02-05 DIAGNOSIS — R339 Retention of urine, unspecified: Secondary | ICD-10-CM | POA: Diagnosis not present

## 2018-04-22 IMAGING — CT CT ANGIO CHEST
2 of 9 series · 18 of 46 positions shown · IV contrast (isovue)
Comparison: Chest x-ray 04/07/2017

CLINICAL DATA: Chest pain, shortness of Breath

EXAM:
CT ANGIOGRAPHY CHEST WITH CONTRAST
TECHNIQUE: Multidetector CT imaging of the chest was performed using the
standard protocol during bolus administration of intravenous
contrast. Multiplanar CT image reconstructions and MIPs were
obtained to evaluate the vascular anatomy.
CONTRAST:  100 cc Isovue 370 IV

[Series 7: thins · axial · 0.74mm/px · z∈[+1378,+1580]mm · 15 of 224 slices shown]
[im 11/224  lung]
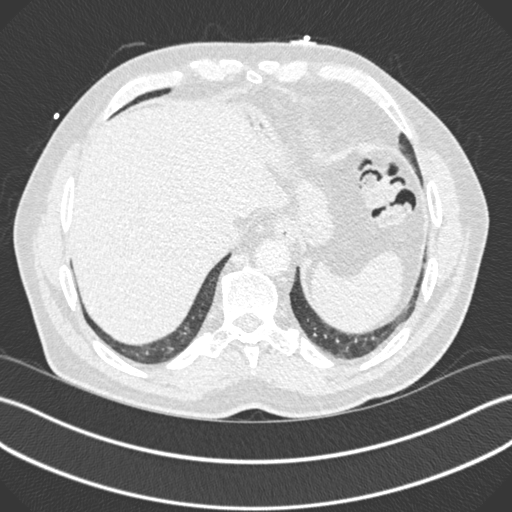
[im 32/224  soft-tissue]
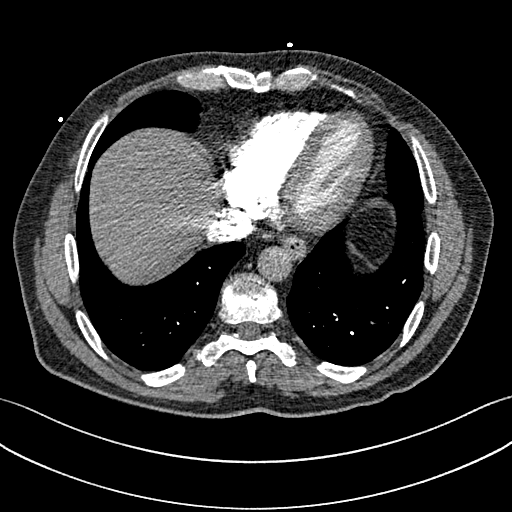
[im 43/224  lung]
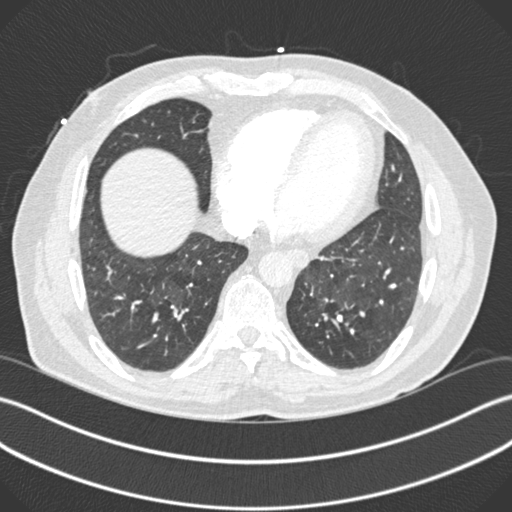
[im 54/224  soft-tissue]
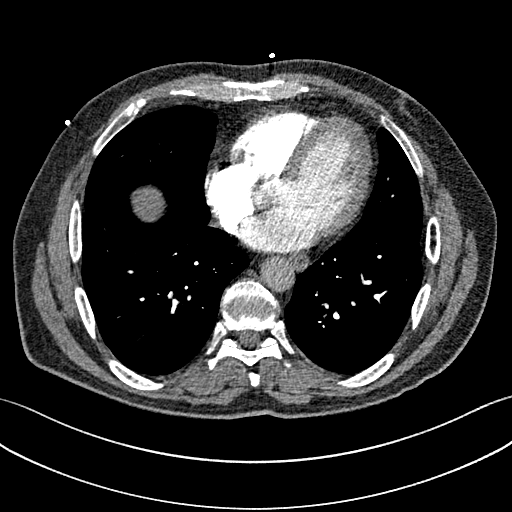
[im 75/224  lung]
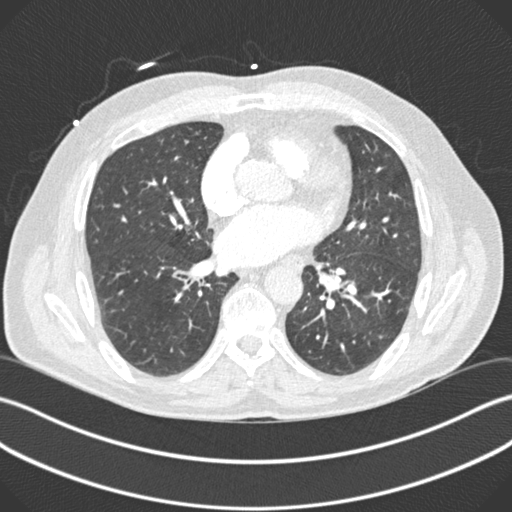
[im 85/224  soft-tissue]
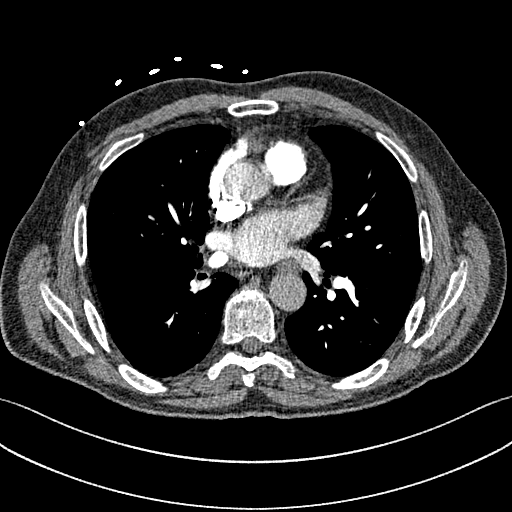
[im 96/224  lung]
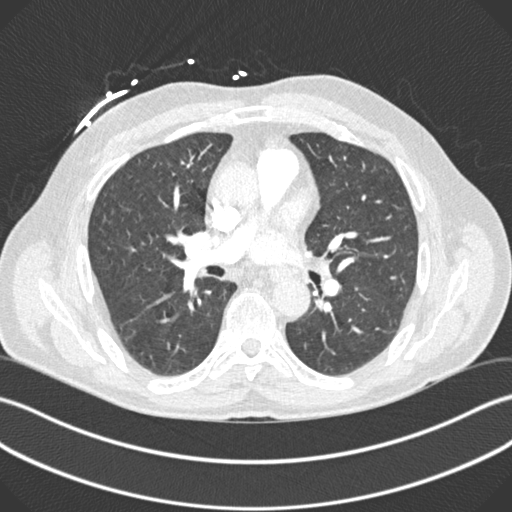
[im 117/224  soft-tissue]
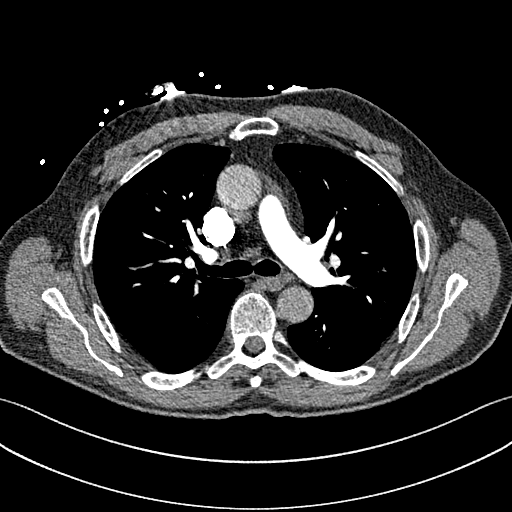
[im 128/224  lung]
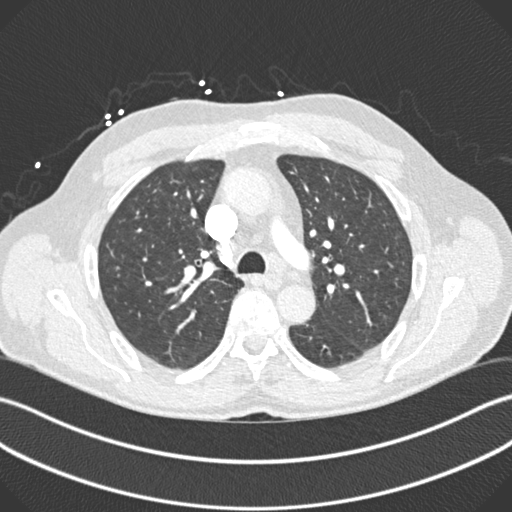
[im 139/224  soft-tissue]
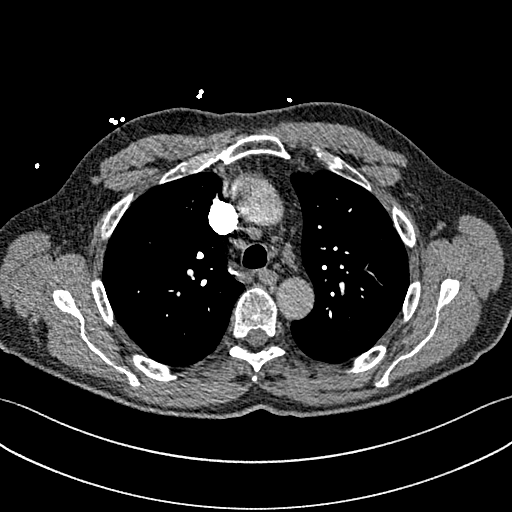
[im 160/224  lung]
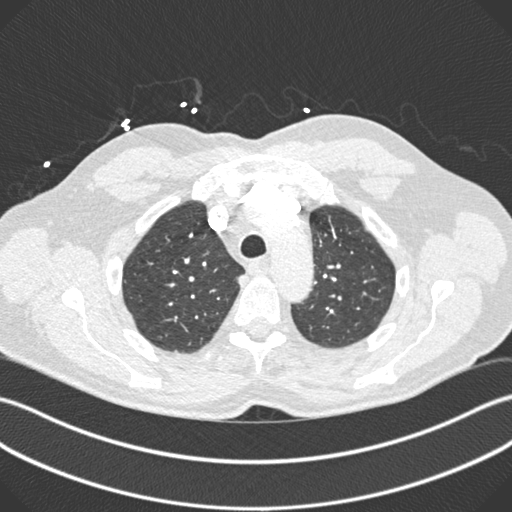
[im 170/224  soft-tissue]
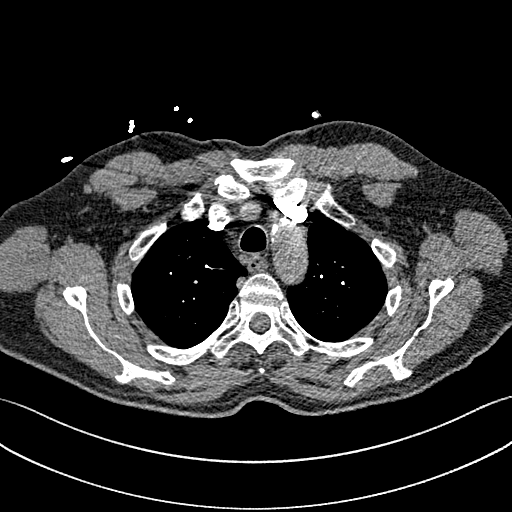
[im 181/224  lung]
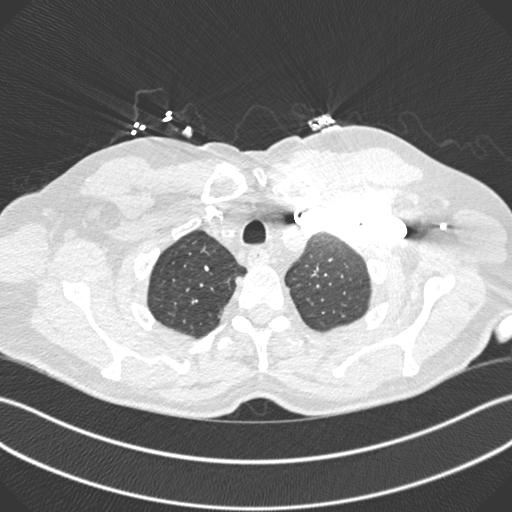
[im 202/224  soft-tissue]
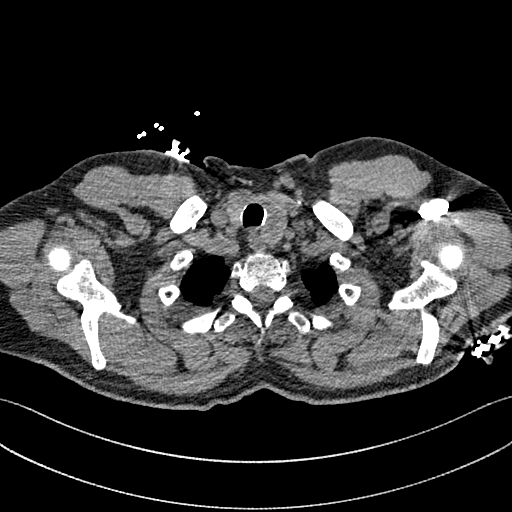
[im 213/224  lung]
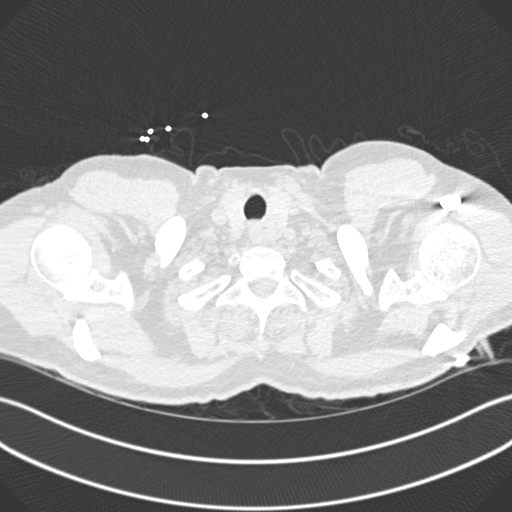

[Series 9: coronal mpr · coronal · 0.59mm/px · 3 of 151 slices shown]
[im 38/151  soft-tissue]
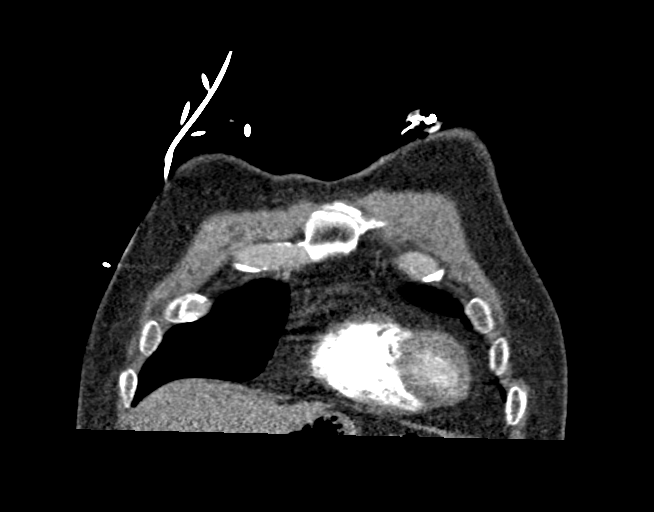
[im 76/151  soft-tissue]
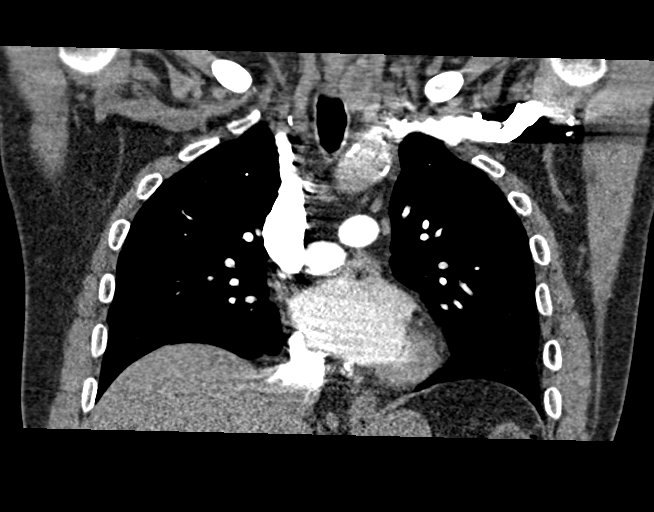
[im 113/151  soft-tissue]
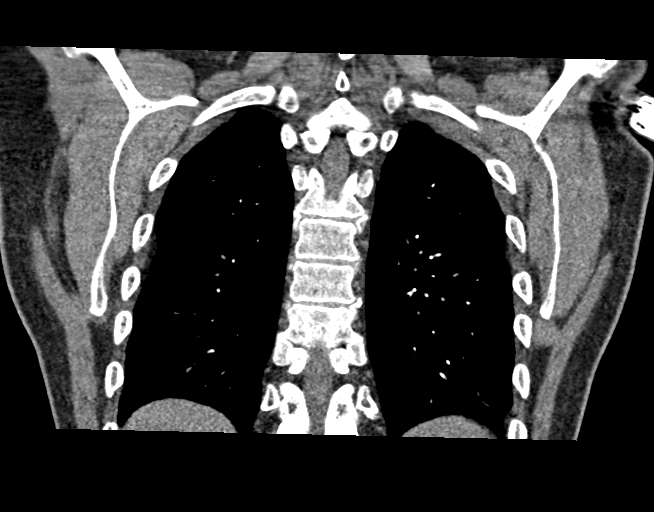

[18 of 46 positions shown; findings below may reference images not displayed]

FINDINGS: Cardiovascular: Heart is normal size. Aortic arch calcifications. No
evidence of aortic aneurysm or dissection. No filling defects in the
pulmonary artery is to suggest pulmonary emboli.

Mediastinum/Nodes: Borderline size scattered mediastinal lymph
nodes, the largest in the left AP window measuring 12 mm in short
axis diameter. No axillary or hilar adenopathy. Trachea and
esophagus are unremarkable. Low-density nodule posteriorly in the
left lobe of the thyroid measuring up to 1.9 cm.

Lungs/Pleura: Lungs are clear. No focal airspace opacities or
suspicious nodules. No effusions.

Upper Abdomen: Imaging into the upper abdomen shows no acute
findings.

Musculoskeletal: Chest wall soft tissues are unremarkable. No acute
bony abnormality.

Review of the MIP images confirms the above findings.
IMPRESSION: No evidence of pulmonary embolus.

Mildly prominent mediastinal lymph nodes, largest in the AP window.
These may be reactive. Recommend follow-up CT in 6 months to assess
stability.

Aortic Atherosclerosis ([id number]).

## 2018-05-06 ENCOUNTER — Encounter: Payer: Self-pay | Admitting: Family Medicine

## 2018-05-06 ENCOUNTER — Ambulatory Visit (INDEPENDENT_AMBULATORY_CARE_PROVIDER_SITE_OTHER): Payer: Medicare Other | Admitting: Family Medicine

## 2018-05-06 VITALS — BP 132/80 | HR 57 | Temp 97.6°F | Ht 69.0 in | Wt 193.0 lb

## 2018-05-06 DIAGNOSIS — D1721 Benign lipomatous neoplasm of skin and subcutaneous tissue of right arm: Secondary | ICD-10-CM | POA: Diagnosis not present

## 2018-05-06 NOTE — Progress Notes (Signed)
Subjective:    Patient ID: Theodore Davis, male    DOB: 1946/05/22, 72 y.o.   MRN: [id number]  Patient has a history of lipomas.  He had a small lipoma on the back of his right shoulder however over the last year this is grown rapidly and is now greater than 6 cm in diameter.  It is soft, spongy, there are no concerning features other than his large size.  It is located on the back of the deltoid muscle.  He is requesting a referral for treatment Past Medical History:  Diagnosis Date  . Arthritis 10/03/2010   right sternoclavicular joint  . Heart murmur   . Hemangioma of spine    lower lumbar  . HOH (hard of hearing)    R ear  . Hyperlipidemia    hypercholesterolemia  . Lyme disease    Past Surgical History:  Procedure Laterality Date  . CARDIOVASCULAR STRESS TEST  08/2010  . CHOLECYSTECTOMY    . HERNIA REPAIR     R inguinal  . JOINT REPLACEMENT Right   . MASTOID DEBRIDEMENT     R ear  . RESECTION DISTAL CLAVICAL Left 11/30/2014   Procedure: RESECTION DISTAL CLAVICAL;  Surgeon: Kathryne Hitch, MD;  Location: [facility];  Service: Orthopedics;  Laterality: Left;  . SHOULDER ARTHROSCOPY WITH BICEPSTENOTOMY Left 11/30/2014   Procedure: SHOULDER ARTHROSCOPY WITH BICEPSTENOTOMY;  Surgeon: Kathryne Hitch, MD;  Location: [facility];  Service: Orthopedics;  Laterality: Left;  . SHOULDER ARTHROSCOPY WITH ROTATOR CUFF REPAIR AND SUBACROMIAL DECOMPRESSION Left 11/30/2014   Procedure: LEFT SHOULDER ARTHROSCOPY WITH DISTAL CLAVICAL EXCISION, ACROMIOPLASTY, ROTATOR CUFF REPAIR , BICEPS TENOTOMY;  Surgeon: Kathryne Hitch, MD;  Location: [facility];  Service: Orthopedics;  Laterality: Left;  . TENDON REPAIR  2005   R elbow  . TOTAL KNEE ARTHROPLASTY  05/28/2011   Procedure: TOTAL KNEE ARTHROPLASTY;  Surgeon: Ninetta Lights, MD;  Location: [facility];  Service: Orthopedics;  Laterality: Right;  Right total knee arthroplasty   . US ECHOCARDIOGRAPHY  08/2010    Current Outpatient Medications on File Prior to Visit  Medication Sig Dispense Refill  . magnesium oxide (MAG-OX) 400 MG tablet Take 400 mg by mouth daily.     Marland Kitchen RAPAFLO 8 MG CAPS capsule Take 1 capsule (8 mg total) by mouth daily with breakfast. 90 capsule 3  . simvastatin (ZOCOR) 20 MG tablet TAKE 1 TABLET BY MOUTH  NIGHTLY AT BEDTIME 90 tablet 3   No current facility-administered medications on file prior to visit.    Allergies  Allergen Reactions  . Codeine Nausea And Vomiting  . Lipitor [Atorvastatin Calcium] Other (See Comments)    myalgias   Social History   Socioeconomic History  . Marital status: Married    Spouse name: Not on file  . Number of children: Not on file  . Years of education: Not on file  . Highest education level: Not on file  Occupational History  . Not on file  Social Needs  . Financial resource strain: Not on file  . Food insecurity:    Worry: Not on file    Inability: Not on file  . Transportation needs:    Medical: Not on file    Non-medical: Not on file  Tobacco Use  . Smoking status: Former Smoker    Packs/day: 0.25    Years: 50.00    Pack years: 12.50    Last attempt to quit: 06/23/1997    Years  since quitting: 20.8  . Smokeless tobacco: Former Network engineer and Sexual Activity  . Alcohol use: Yes    Comment: occasional beer  . Drug use: No  . Sexual activity: Yes  Lifestyle  . Physical activity:    Days per week: Not on file    Minutes per session: Not on file  . Stress: Not on file  Relationships  . Social connections:    Talks on phone: Not on file    Gets together: Not on file    Attends religious service: Not on file    Active member of club or organization: Not on file    Attends meetings of clubs or organizations: Not on file    Relationship status: Not on file  . Intimate partner violence:    Fear of current or ex partner: Not on file    Emotionally abused: Not on file    Physically abused: Not on file    Forced  sexual activity: Not on file  Other Topics Concern  . Not on file  Social History Narrative   [facility] (04/08/17):   Originally from [facility]. Has lived in [place] as well. Previously worked for the Education officer, environmental as a Dealer. Has 2 dogs & a rabbit currently. No bird exposure. He reports black mold was found in his air conditioner at [facility] when it was changed out 2 years ago. No hot tub exposure. Enjoys fishing.    Family History  Problem Relation Age of Onset  . Heart disease Mother   . Atrial fibrillation Mother   . Heart disease Father   . Rheum arthritis Father   . Bladder Cancer Father   . Atrial fibrillation Father   . Brain cancer Maternal Uncle   . Lung disease Neg Hx      Review of Systems  All other systems reviewed and are negative.      Objective:   Physical Exam  Constitutional: He appears well-developed and well-nourished. No distress.  Cardiovascular: Normal rate, regular rhythm and normal heart sounds.  Pulmonary/Chest: Effort normal and breath sounds normal. No respiratory distress. He has no wheezes.  Musculoskeletal:       Right shoulder: He exhibits deformity.       Arms: Skin: He is not diaphoretic.  Nursing note and vitals reviewed.  Large 6 cm lipoma on the posterior aspect of the right shoulder      Assessment & Plan:  Lipoma of right upper extremity - Plan: Ambulatory referral to General Surgery Reassured the patient that I believe this is a benign lipoma.  However I would recommend general surgery excision given the fact is growing so rapidly and has become so large

## 2018-05-14 ENCOUNTER — Ambulatory Visit: Payer: Medicare Other | Admitting: Cardiovascular Disease

## 2018-05-14 ENCOUNTER — Encounter: Payer: Self-pay | Admitting: Cardiovascular Disease

## 2018-05-14 VITALS — BP 124/72 | HR 60 | Ht 69.0 in | Wt 194.6 lb

## 2018-05-14 DIAGNOSIS — I358 Other nonrheumatic aortic valve disorders: Secondary | ICD-10-CM

## 2018-05-14 DIAGNOSIS — Z8249 Family history of ischemic heart disease and other diseases of the circulatory system: Secondary | ICD-10-CM | POA: Diagnosis not present

## 2018-05-14 DIAGNOSIS — I700 Atherosclerosis of aorta: Secondary | ICD-10-CM

## 2018-05-14 DIAGNOSIS — E785 Hyperlipidemia, unspecified: Secondary | ICD-10-CM

## 2018-05-14 MED ORDER — SIMVASTATIN 40 MG PO TABS: ORAL_TABLET | ORAL | 3 refills | Status: DC

## 2018-05-14 NOTE — Progress Notes (Signed)
Patient ID: Theodore Davis, male   DOB: 09-20-1945, 72 y.o.   MRN: [id number]   Primary M.D.: Dr. Margaretmary Eddy  HPI: Theodore Davis is a 72 y.o. male who presents to the office today for a 22 month follow-up cardiology evaluation.    Mr. Tarrell Debes has a strong family history for CAD in both his mother and father.  He also has a history of hyperlipidemia.  I had seen him in 2012 and at that time an echo Doppler study was essentially normal.  A nuclear perfusion study revealed normal perfusion and function.  Post-rest ejection fraction was 68%.   I had not seen him in over 5-1/2 years but saw him 2 months ago when he was referred by Dr. Dennard Schaumann for me to evaluate  episodes of shortness of breath with activity. Oftentimes his shortness of breath is worse when he wakes up.  He denied any definitive chest pain.  He does admit to some increased anxiety.  He also has had issues with leg cramps but has been taking supplemental magnesium with benefit.  He was put on Rapaflo by his urologist to improve urine flow.  This has resulted in increased episodes of shortness of breath.    In 2016 laboratory revealed a normal BNP at 21.7.  He was not anemic with a hemoglobin of 15 and hematocrit of 43.8. Chemistry panel was normal, although glucose was minimally increased at 106, and bilirubin at 1.4 but other LFTs were normal. PSA was 0.45.  Lipid studies revealed a total cholesterol 164, triglycerides 80, HDL 56, and LDL 92.  TSH was 0.349. A chest x-ray in November showed stable right infrahilar prominence, which was present on a prior chest x-ray of November 2012.  There was no acute cardiopulmonary abnormality.  An echo Doppler study on 05/15/2015 showed an ejection fraction at 55-60% with normal wall motion.  There was a suggestion of mild grade 1 diastolic dysfunction.  Tissue Doppler was indeterminant.  There was mild thickening of the mitral valve with trivial mitral regurgitation.     He has been retired for  many years.  I last saw him in January 2018 that time he denied any chest pain.  He has joined a gym and was exercising.  He has lost 10 pounds.   Since I last saw him, he underwent an echo Doppler study in October 2018 ordered by Dr. Ashok Cordia which showed normal systolic function.  There was mild focal basal hypertrophy of the septum.  EF is 55 to 60% and there was grade 1 diastolic dysfunction.  There was mild aortic sclerosis without stenosis.  In January 2018 he underwent a chest CT ordered by Dr. Lake Bells.  He had mildly enlarged mediastinal lymph nodes, potentially reactive in etiology which are grossly unchanged from previous assessment.  He also was noted to have aortic atherosclerosis.  Presently, he feels well.  He denies chest pain PND orthopnea.  Laboratory in July 2019 showed an LDL at 59 and he has been on simvastatin 20 mg.  He presents for evaluation.   Past Medical History:  Diagnosis Date  . Arthritis 10/03/2010   right sternoclavicular joint  . Heart murmur   . Hemangioma of spine    lower lumbar  . HOH (hard of hearing)    R ear  . Hyperlipidemia    hypercholesterolemia  . Lyme disease     Past Surgical History:  Procedure Laterality Date  . CARDIOVASCULAR STRESS TEST  08/2010  .  CHOLECYSTECTOMY    . HERNIA REPAIR     R inguinal  . JOINT REPLACEMENT Right   . MASTOID DEBRIDEMENT     R ear  . RESECTION DISTAL CLAVICAL Left 11/30/2014   Procedure: RESECTION DISTAL CLAVICAL;  Surgeon: Kathryne Hitch, MD;  Location: [facility];  Service: Orthopedics;  Laterality: Left;  . SHOULDER ARTHROSCOPY WITH BICEPSTENOTOMY Left 11/30/2014   Procedure: SHOULDER ARTHROSCOPY WITH BICEPSTENOTOMY;  Surgeon: Kathryne Hitch, MD;  Location: [facility];  Service: Orthopedics;  Laterality: Left;  . SHOULDER ARTHROSCOPY WITH ROTATOR CUFF REPAIR AND SUBACROMIAL DECOMPRESSION Left 11/30/2014   Procedure: LEFT SHOULDER ARTHROSCOPY WITH DISTAL CLAVICAL EXCISION, ACROMIOPLASTY,  ROTATOR CUFF REPAIR , BICEPS TENOTOMY;  Surgeon: Kathryne Hitch, MD;  Location: [facility];  Service: Orthopedics;  Laterality: Left;  . TENDON REPAIR  2005   R elbow  . TOTAL KNEE ARTHROPLASTY  05/28/2011   Procedure: TOTAL KNEE ARTHROPLASTY;  Surgeon: Ninetta Lights, MD;  Location: [facility];  Service: Orthopedics;  Laterality: Right;  Right total knee arthroplasty   . US ECHOCARDIOGRAPHY  08/2010    Allergies  Allergen Reactions  . Codeine Nausea And Vomiting  . Lipitor [Atorvastatin Calcium] Other (See Comments)    myalgias    Current Outpatient Medications  Medication Sig Dispense Refill  . magnesium oxide (MAG-OX) 400 MG tablet Take 400 mg by mouth daily.     Marland Kitchen RAPAFLO 8 MG CAPS capsule Take 1 capsule (8 mg total) by mouth daily with breakfast. 90 capsule 3  . simvastatin (ZOCOR) 40 MG tablet TAKE 1 TABLET BY MOUTH  NIGHTLY AT BEDTIME 90 tablet 3   No current facility-administered medications for this visit.     Social History   Socioeconomic History  . Marital status: Married    Spouse name: Not on file  . Number of children: Not on file  . Years of education: Not on file  . Highest education level: Not on file  Occupational History  . Not on file  Social Needs  . Financial resource strain: Not on file  . Food insecurity:    Worry: Not on file    Inability: Not on file  . Transportation needs:    Medical: Not on file    Non-medical: Not on file  Tobacco Use  . Smoking status: Former Smoker    Packs/day: 0.25    Years: 50.00    Pack years: 12.50    Last attempt to quit: 06/23/1997    Years since quitting: 20.9  . Smokeless tobacco: Former Network engineer and Sexual Activity  . Alcohol use: Yes    Comment: occasional beer  . Drug use: No  . Sexual activity: Yes  Lifestyle  . Physical activity:    Days per week: Not on file    Minutes per session: Not on file  . Stress: Not on file  Relationships  . Social connections:    Talks on phone: Not on  file    Gets together: Not on file    Attends religious service: Not on file    Active member of club or organization: Not on file    Attends meetings of clubs or organizations: Not on file    Relationship status: Not on file  . Intimate partner violence:    Fear of current or ex partner: Not on file    Emotionally abused: Not on file    Physically abused: Not on file    Forced sexual activity:  Not on file  Other Topics Concern  . Not on file  Social History Narrative   [facility] (04/08/17):   Originally from [facility]. Has lived in [place] as well. Previously worked for the Education officer, environmental as a Dealer. Has 2 dogs & a rabbit currently. No bird exposure. He reports black mold was found in his air conditioner at [facility] when it was changed out 2 years ago. No hot tub exposure. Enjoys fishing.    Additional social history is notable that he is retired from  [employer].  He is married for 37 years and has 2 children, ages 71 and 66.  He has smoked for 25-30 years but quit 17 years ago.  He exercises occasionally only 4-5 times per month.  Family History  Problem Relation Age of Onset  . Heart disease Mother   . Atrial fibrillation Mother   . Heart disease Father   . Rheum arthritis Father   . Bladder Cancer Father   . Atrial fibrillation Father   . Brain cancer Maternal Uncle   . Lung disease Neg Hx    Additional family history is notable in that his mother died with a myocardial infarction but had a history of atrial fibrillation.  Father also had valve disease and died in 08-26-2012.  ROS General: Negative; No fevers, chills, or night sweats HEENT: Negative; No changes in vision or hearing, sinus congestion, difficulty swallowing Pulmonary: Positive for seasonal allergies; Negative; No cough, wheezing, shortness of breath, hemoptysis Cardiovascular: See HPI:  GI: Negative; No nausea, vomiting, diarrhea, or abdominal pain GU: Negative; No dysuria, hematuria, or difficulty  voiding Musculoskeletal: Negative; no myalgias, joint pain, or weakness Hematologic: Negative; no easy bruising, bleeding Endocrine: Negative; no heat/cold intolerance; no diabetes, Neuro: Negative; no changes in balance, headaches Skin: Negative; No rashes or skin lesions Psychiatric: Positive for anxiety Sleep: Negative; No snoring,  daytime sleepiness, hypersomnolence, bruxism, restless legs, hypnogognic hallucinations. Other comprehensive 14 point system review is negative   Physical Exam BP 124/72   Pulse 60   Ht _0  (1.753 m)   Wt 194 lb 9.6 oz (88.3 kg)   BMI 28.74 kg/m    Wt Readings from Last 3 Encounters:  05/14/18 194 lb 9.6 oz (88.3 kg)  05/06/18 193 lb (87.5 kg)  01/13/18 191 lb (86.6 kg)   General: Alert, oriented, no distress.  Skin: normal turgor, no rashes, warm and dry HEENT: Normocephalic, atraumatic. Pupils equal round and reactive to light; sclera anicteric; extraocular muscles intact;  Nose without nasal septal hypertrophy Mouth/Parynx benign; Mallinpatti scale 2 Neck: No JVD, no carotid bruits; normal carotid upstroke Lungs: clear to ausculatation and percussion; no wheezing or rales Chest wall: without tenderness to palpitation Heart: PMI not displaced, RRR, s1 s2 normal, 1/6 systolic murmur, no diastolic murmur, no rubs, gallops, thrills, or heaves Abdomen: soft, nontender; no hepatosplenomehaly, BS+; abdominal aorta nontender and not dilated by palpation. Back: no CVA tenderness Pulses 2+ Musculoskeletal: full range of motion, normal strength, no joint deformities Extremities: no clubbing cyanosis or edema, Homan's sign negative  Neurologic: grossly nonfocal; Cranial nerves grossly wnl Psychologic: Normal mood and affect   ECG (independently read by me): NSR at 60; Q wave in 3, normal intervals  January 2018 ECG (independently read by me): Normal sinus rhythm at 68 bpm.  Q wave in lead 3 and aVF.  January 2017 ECG (independently read by me):   Normal sinus rhythm at 61 bpm.  No ectopy.  Normal intervals.  November 2016 ECG (independently read by me): Sinus bradycardia 55 bpm.  Inferior infarct by ECG with Q waves in 3 and aVF.  LABS:  BMP Latest Ref Rng & Units 01/13/2018 04/07/2017 11/10/2016  Glucose 65 - 99 mg/dL 115(H) 123(H) 146(H)  BUN 7 - 25 mg/dL _0 Creatinine 0.70 - 1.25 mg/dL 0.94 1.08 0.95  BUN/Creat Ratio 6 - 22 (calc) NOT APPLICABLE - -  Sodium 159 - 146 mmol/L 139 139 141  Potassium 3.5 - 5.3 mmol/L 5.0 4.2 4.3  Chloride 98 - 110 mmol/L 103 107 107  CO2 20 - 32 mmol/L _1 Calcium 8.6 - 10.3 mg/dL 9.2 8.8(L) 9.1    Hepatic Function Latest Ref Rng & Units 01/13/2018 11/10/2016 05/07/2015  Total Protein 6.1 - 8.1 g/dL 6.5 6.2 6.4  Albumin 3.6 - 5.1 g/dL - 4.1 3.8  AST 10 - 35 U/L _2 ALT 9 - 46 U/L _3 Alk Phosphatase 40 - 115 U/L - 64 67  Total Bilirubin 0.2 - 1.2 mg/dL 1.5(H) 1.8(H) 1.4(H)  Bilirubin, Direct 0.0 - 0.3 mg/dL - - -    CBC Latest Ref Rng & Units 01/13/2018 05/06/2017 04/07/2017  WBC 3.8 - 10.8 Thousand/uL 5.8 8.2 5.3  Hemoglobin 13.2 - 17.1 g/dL 16.1 15.5 15.7  Hematocrit 38.5 - 50.0 % 46.8 45.9 44.7  Platelets 140 - 400 Thousand/uL 228 220.0 209   Lab Results  Component Value Date   MCV 88.5 01/13/2018   MCV 91.0 05/06/2017   MCV 89.0 04/07/2017    Lab Results  Component Value Date   TSH 0.30 (L) 11/10/2016    BNP    Component Value Date/Time   BNP 21.7 05/07/2015 1131    ProBNP No results found for: PROBNP   Lipid Panel     Component Value Date/Time   CHOL 162 01/13/2018 1030   TRIG 116 01/13/2018 1030   HDL 58 01/13/2018 1030   CHOLHDL 2.8 01/13/2018 1030   VLDL 16 12/29/2016 0951   LDLCALC 83 01/13/2018 1030     RADIOLOGY: No results found.  IMPRESSION:  1. Family history of heart disease   2. Hyperlipidemia LDL goal <70   3. Aortic valve sclerosis   4. Aortic atherosclerosis [facility])     ASSESSMENT AND PLAN: Mr. Sanders Manninen is  a 72 year-old Caucasian male who has a significant family history of  CAD.  He has hyperlipidemia and has been maintained on simvastatin 20 mg daily.  In 2012, he had normal echo Doppler study and normal myocardial perfusion.  An echo Doppler study in November 2016 revealed normal systolic function and suggested mild grade 1 diastolic dysfunction.  He did not have any wall motion abnormality.  His ECG have demonstrated inferior Q waves.  He denies any chest pain.  There are no palpitations.  Presently, his blood pressure is stable and on repeat by me was 122/70.  I reviewed his most recent echo Doppler study from October 2018 which essentially was unchanged from previously.  LV function remains normal and there was grade 1 diastolic dysfunction with mild a aortic sclerosis.  I reviewed his chest CT which has demonstrated mildly enlarged mediastinal lymph nodes felt most likely to be reactive in etiology but follow-up testing was recommended.  He also was noted to have aortic atherosclerosis.  His LDL in July 2019 was 83 on simvastatin 20 mg.  With his documentation of subclinical atherosclerosis I am further titrating simvastatin  to 40 mg in an attempt to reach LDL less than 70.  He remains asymptomatic without any anginal type symptoms.  He typically exercises regularly and walks 4 miles.  He is retired and fishes.  He will be following up with Dr. Dennard Schaumann I have recommended repeat chemistry and lipid studies in several months on his increased simvastatin dose.  I will see him in 1 year prior to that evaluation he will undergo a 2-year follow-up echo Doppler assessment.  Time spent: 25 minutes  Troy Sine, MD, [facility]  05/16/2018 9:37 AM

## 2018-05-14 NOTE — Patient Instructions (Signed)
Medication Instructions:  INCREASE simvastatin to 40 mg daily  If you need a refill on your cardiac medications before your next appointment, please call your pharmacy.   Lab work: Please return for FASTING labs in 3 months (CMET, Lipid)  Our in office lab hours are Monday-Friday 8:00-4:00, closed for lunch 12:45-1:45 pm.  No appointment needed.  If you have labs (blood work) drawn today and your tests are completely normal, you will receive your results only by: Marland Kitchen MyChart Message (if you have MyChart) OR . A paper copy in the mail If you have any lab test that is abnormal or we need to change your treatment, we will call you to review the results.  Testing/Procedures: Your physician has requested that you have an echocardiogram in 1 YEAR (October 2020). Echocardiography is a painless test that uses sound waves to create images of your heart. It provides your doctor with information about the size and shape of your heart and how well your heart's chambers and valves are working. This procedure takes approximately one hour. There are no restrictions for this procedure.  Follow-Up: At [facility], you and your health needs are our priority.  As part of our continuing mission to provide you with exceptional heart care, we have created designated Provider Care Teams.  These Care Teams include your primary Cardiologist (physician) and Advanced Practice Providers (APPs -  Physician Assistants and Nurse Practitioners) who all work together to provide you with the care you need, when you need it. You will need a follow up appointment in 12 months.  Please call our office 2 months in advance to schedule this appointment.  You may see Dr. Claiborne Billings or one of the following Advanced Practice Providers on your designated Care Team: [place], [place] . Fabian Sharp, PA-C

## 2018-05-16 ENCOUNTER — Encounter: Payer: Self-pay | Admitting: Cardiovascular Disease

## 2018-06-29 ENCOUNTER — Inpatient Hospital Stay: Admit: 2018-06-29 | Discharge: 2018-06-30 | Disposition: A | Discharge status: Home / Self Care | Payer: MEDICARE

## 2018-06-29 ENCOUNTER — Emergency Department: Admit: 2018-06-29 | Payer: MEDICARE | Primary: Internal Medicine

## 2018-06-29 DIAGNOSIS — S82832A Other fracture of upper and lower end of left fibula, initial encounter for closed fracture: Secondary | ICD-10-CM

## 2018-06-29 NOTE — ED Provider Notes (Signed)
HPI:  06/29/18, Time: 6:30 PM         Troy Mcdaniel is a 73 y.o. male presenting to the ED for injuries  from a fall.  He injured his left leg.  He said that the dog caused him to trip and he slipped on the floor and he fell.  He has pain on the top of his left foot at the base of the toes and he also has pain at the fibular head.  This happened just prior to arrival he said nothing else hurts.        Review of Systems:   Pertinent positives and negatives are stated within HPI, all other systems reviewed and are negative.          --------------------------------------------- PAST HISTORY ---------------------------------------------  Past Medical History:  has a past medical history of Cancer (HCC) and Hypertension.    Past Surgical History:  has a past surgical history that includes Prostatectomy.    Social History:  reports that he has quit smoking. He does not have any smokeless tobacco history on file. He reports current alcohol use of about 1.0 standard drinks of alcohol per week. He reports that he does not use drugs.    Family History: family history is not on file.     The patient's home medications have been reviewed.    Allergies: Patient has no known allergies.    -------------------------------------------------- RESULTS -------------------------------------------------  All laboratory and radiology results have been personally reviewed by myself   LABS:  No results found for this visit on 06/29/18.    RADIOLOGY:  Interpreted by Radiologist.  XR FOOT LEFT (MIN 3 VIEWS)   Final Result   1. Dorsal soft tissue swelling without evidence of acute fracture.   2. Moderate arthrosis at the 1st MTP joint.      XR TIBIA FIBULA LEFT (2 VIEWS)   Final Result   Nondisplaced fracture through the proximal fibula neck.          ------------------------- NURSING NOTES AND VITALS REVIEWED ---------------------------   The nursing notes within the ED encounter and vital signs as below have been reviewed.   BP 123/81    Pulse 64   Temp 97.4 F (36.3 C) (Oral)   Resp 16   Wt 215 lb (97.5 kg)   SpO2 98%   Oxygen Saturation Interpretation: Normal      ---------------------------------------------------PHYSICAL EXAM--------------------------------------      Constitutional/General: Alert and oriented x3, well appearing, non toxic in NAD  Head: Normocephalic and atraumatic  Eyes: clear  Mouth: Oropharynx clear, handling secretions, no trismus  Neck: Supple, full ROM,   Pulmonary: Lungs clear to auscultation bilaterally, no wheezes, rales, or rhonchi. Not in respiratory distress  Cardiovascular:  Regular rate and rhythm,  Abdomen: Soft, non tender, non distended,   Extremities:  all extremities x 4. Warm and well perfused, foot has edema on the top of the foot he has a good pedal pulse and he has good capillary refill.  He is tender at the fibular head.    Skin: warm and dry without rash  Neurologic: GCS 15,  Psych: Normal Affect      ------------------------------ ED COURSE/MEDICAL DECISION MAKING----------------------  Medications - No data to display      ED COURSE:       Medical Decision Making:    Tripped and fell while trying to take care of his dog he has pain at the fibular head and also on top of the foot.  X-rays were obtained a foot x-ray was negative he does have a fracture through the proximal neck of the fibula.    Was placed in a knee immobilizer and advised no weightbearing until he sees orthopedics he was provided with crutches.  I did order him some Norco for pain.  I did search the  PDMP and felt it was appropriate to provide him with pain medication.  He should follow-up with orthopedics-- Dr. Yetta Barre as soon as possible if he develops any numbness or problems in the leg he needs to go to the ED.        --------------------------------- IMPRESSION AND DISPOSITION ---------------------------------    IMPRESSION  1. Closed fracture of proximal end of left fibula, unspecified fracture morphology, initial encounter         DISPOSITION  Disposition: Discharge to home  Patient condition is good      NOTE: This report was transcribed using voice recognition software. Every effort was made to ensure accuracy; however, inadvertent computerized transcription errors may be present     Reino Kent, APRN - CNP  06/29/18 1917

## 2018-06-30 MED ORDER — HYDROCODONE-ACETAMINOPHEN 5-325 MG PO TABS
5-325 MG | ORAL_TABLET | Freq: Four times a day (QID) | ORAL | 0 refills | Status: AC | PRN
Start: 2018-06-30 — End: 2018-07-02

## 2018-07-02 ENCOUNTER — Ambulatory Visit
Admit: 2018-07-02 | Discharge: 2018-07-02 | Payer: MEDICARE | Attending: Orthopaedic Surgery | Primary: Internal Medicine

## 2018-07-02 DIAGNOSIS — S93602A Unspecified sprain of left foot, initial encounter: Secondary | ICD-10-CM

## 2018-07-02 NOTE — Progress Notes (Signed)
Troy Mcdaniel is a 73 y.o. male, who presents   Chief Complaint   Patient presents with   ??? New Patient     new patient for left fibula FX,  patient states he tripped over the dog DOI 06/29/18, patient has swelling in his left ankle       HPI:: Injury occurred 3 days ago.  Patient was bringing his dog again and stepped on a slick floor with the stocking foot.  He he fell to his left side hitting his left lateral leg area.  It was painful and he did notice swelling and was seen emergently at [facility] where x-rays of the left tibia and fibula and also the left foot were taken.  He was placed in an immobilizer.  Fracture of the proximal fibula.  He has also been on crutches.  Since the injury he is noticed more swelling in his lower leg and ankle and foot with discoloration as well.  He is bruised down to his toes.  There is been no numbness.  There is little discomfort with weightbearing on the foot.    Allergies; medications; past medical, surgical, family, and social history; and problem list have been reviewed today and updated as indicated in this encounter - see below following Ortho specifics.    Musculoskeletal: Skin condition gross neurovascular function good in the left lower extremity.  There is no evidence of compartment syndrome.  There is minimal edema around the proximal leg or knee.  There is edema from the distal one fourth of the leg into the toes.  There is ecchymosis mostly on the lateral side of the hindfoot and also in the dorsum of the forefoot and toes.  He is able to move his toes and his ankle well.  There is mild tenderness around the anterolateral ankle and foot  There is no frank instability suggested though his mobility is good and ankle and foot.    Radiologic Studies: Imaging studies 06/29/2017 suggested a fracture of the proximal shaft of the left fibula which was completely nondisplaced.  (Tenderness in this area supports this finding)    There is no evidence of instability of the  ankle or disruption of the ankle mortise.    Left foot films showed significant degeneration of the first MTP joint with no other gross abnormalities and no suggestion of fractures through the midfoot or any instability patterns.    ASSESSMENT:  Troy Mcdaniel was seen today for new patient.    Diagnoses and all orders for this visit:    Sprain of left foot, initial encounter    Closed fracture of proximal end of left fibula, unspecified fracture morphology, initial encounter     Treatment alternatives were reviewed including medical and physical therapies, injections, and surgical options, expected risks benefits and likely outcome of each were discussed in detail, questions asked and answered and understood.  The findings and suspicions were discussed in detail with the patient and his wife with parent good understanding.    PLAN: We will discontinue the knee immobilizer and allow motion of his knee ad lib.  This poses no threat to the fibula.  He should work on range of motion of the ankle and foot to help mobilize edema and elevate the foot and ankle as well.  He can put partial weight on the left lower extremity using his crutches initially and progress as indicated by comfort.  Will follow in a week to see if anything has changed to prompt further  evaluation or enforce more restrictions.        There is no problem list on file for this patient.      Past Medical History:   Diagnosis Date   ??? Cancer [facility](HCC)     prostate   ??? Hypertension        Past Surgical History:   Procedure Laterality Date   ??? PROSTATECTOMY         Current Outpatient Medications   Medication Sig Dispense Refill   ??? montelukast (SINGULAIR) 10 MG tablet Take 10 mg by mouth nightly     ??? budesonide-formoterol (SYMBICORT) 160-4.5 MCG/ACT AERO Inhale 2 puffs into the lungs 2 times daily     ??? HYDROcodone-acetaminophen (NORCO) 5-325 MG per tablet Take 1 tablet by mouth every 6 hours as needed for Pain for up to 3 days. 12 tablet 0   ??? losartan (COZAAR) 50  MG tablet Take 50 mg by mouth daily     ??? Multiple Vitamins-Minerals (THERAPEUTIC MULTIVITAMIN-MINERALS) tablet Take 1 tablet by mouth daily     ??? LATANOPROST OP Apply to eye     ??? fluticasone (FLONASE) 50 MCG/ACT nasal spray 1 spray by Nasal route daily.     ??? brompheniramine-pseudoephedrine-DM 30-2-10 MG/5ML syrup Take 5 mLs by mouth 4 times daily as needed for Congestion or Cough (Patient not taking: Reported on 07/02/2018) 120 mL 0   ??? timolol (TIMOPTIC) 0.25 % ophthalmic solution 1 drop 2 times daily     ??? albuterol sulfate HFA (PROAIR HFA) 108 (90 BASE) MCG/ACT inhaler Inhale 2 puffs into the lungs every 6 hours as needed for Wheezing (Patient not taking: Reported on 07/02/2018) 1 Inhaler 0     No current facility-administered medications for this visit.        No Known Allergies    Social History     Socioeconomic History   ??? Marital status: Married     Spouse name: None   ??? Number of children: None   ??? Years of education: None   ??? Highest education level: None   Occupational History   ??? None   Social Needs   ??? Financial resource strain: None   ??? Food insecurity:     Worry: None     Inability: None   ??? Transportation needs:     Medical: None     Non-medical: None   Tobacco Use   ??? Smoking status: Former Smoker   ??? Smokeless tobacco: Never Used   Substance and Sexual Activity   ??? Alcohol use: Yes     Alcohol/week: 1.0 standard drinks     Types: 1 Glasses of wine per week     Comment: occ   ??? Drug use: No   ??? Sexual activity: None   Lifestyle   ??? Physical activity:     Days per week: None     Minutes per session: None   ??? Stress: None   Relationships   ??? Social connections:     Talks on phone: None     Gets together: None     Attends religious service: None     Active member of club or organization: None     Attends meetings of clubs or organizations: None     Relationship status: None   ??? Intimate partner violence:     Fear of current or ex partner: None     Emotionally abused: None     Physically abused: None  Forced sexual activity: None   Other Topics Concern   ??? None   Social History Narrative   ??? None       Family History   Problem Relation Age of Onset   ??? Dementia Mother    ??? Cancer Father          Review of Systems:   As follows except as previously noted in HPI:  Constitutional: Negative for chills, diaphoresis,  fever   Respiratory: Negative for cough, shortness of breath and wheezing.    Cardiovascular: Negative for chest pain and palpitations.   Neurological: Negative for dizziness, syncope,   GI / GU: abdominal pain or cramping  Musculoskeletal: see HPI       Objective:   Physical Exam   Constitutional: Oriented to person, place, and time. and appears well-developed and well-nourished. :   Head: Normocephalic and atraumatic.   Neck: Neck supple.  Eyes: EOM are normal.   Pulmonary/Chest: Effort normal.  No respiratory distress, no wheezes.   Neurological: Alert and oriented to person  Skin: Skin is warm and dry.               Quitman Livings, DO    07/02/18  9:20 AM    All reasonable efforts have been made to minimize the risk of errors that may occur in the use of voice recognition and other electronic means of charting.

## 2018-07-09 ENCOUNTER — Ambulatory Visit: Admit: 2018-07-09 | Discharge: 2018-07-09 | Attending: Orthopaedic Surgery | Primary: Internal Medicine

## 2018-07-09 DIAGNOSIS — S93602A Unspecified sprain of left foot, initial encounter: Secondary | ICD-10-CM

## 2018-07-09 DIAGNOSIS — D171 Benign lipomatous neoplasm of skin and subcutaneous tissue of trunk: Secondary | ICD-10-CM | POA: Diagnosis not present

## 2018-07-09 NOTE — Progress Notes (Signed)
Is 10 days post injury chief Complaint:   Chief Complaint   Patient presents with   ??? Follow-up     follow up from left fibula FX, DOI 06/29/18, patient still has swelling, he complains of burning pain on his ankle and muscle pain.        Lucianne Muss 10 days post injury to his left foot and leg.  He still has some soreness but it is getting better.  He has swelling in the leg and significant bruising in his forefoot.  He has been doing a little bit of walking.  He is avoided tremulous activities.      Allergies; medications; past medical, surgical, family, and social history; and problem list have been reviewed today and updated as indicated in this encounter seen below.    Exam: There is mild edema in the leg.  There is moderate edema in the foot and ankle.  There is significant ecchymosis in the forefoot at the base of the toes.  There is some tenderness in the area of the proximal fibula as well as the anterolateral ankle and the midfoot.  There are no gross instability patterns suggested in the midfoot area.  There is mild discomfort with mobilization.  His ankle is stable.  There is no instability proximal fibula fracture area.    Radiographs: Previous images were reviewed and discussed with the patient and his wife.    ASSESSMENT:    Bradrick was seen today for follow-up.    Diagnoses and all orders for this visit:    Sprain of left foot, initial encounter    Closed fracture of proximal end of left fibula, unspecified fracture morphology, initial encounter        PLAN: Support stocking or Ace wrap for the foot and ankle to help decrease edema and improve comfort and support.  Good supportive shoes for walking activity.  He would like to get back to the gym and I recommended a stationary cycle which will allow him to place his foot to minimize stress and range of motion still get some aerobic exercise.  We will follow-up in 3 weeks.    Return in about 3 weeks (around 07/30/2018).       Current Outpatient  Medications   Medication Sig Dispense Refill   ??? montelukast (SINGULAIR) 10 MG tablet Take 10 mg by mouth nightly     ??? budesonide-formoterol (SYMBICORT) 160-4.5 MCG/ACT AERO Inhale 2 puffs into the lungs 2 times daily     ??? losartan (COZAAR) 50 MG tablet Take 50 mg by mouth daily     ??? brompheniramine-pseudoephedrine-DM 30-2-10 MG/5ML syrup Take 5 mLs by mouth 4 times daily as needed for Congestion or Cough 120 mL 0   ??? timolol (TIMOPTIC) 0.25 % ophthalmic solution 1 drop 2 times daily     ??? Multiple Vitamins-Minerals (THERAPEUTIC MULTIVITAMIN-MINERALS) tablet Take 1 tablet by mouth daily     ??? albuterol sulfate HFA (PROAIR HFA) 108 (90 BASE) MCG/ACT inhaler Inhale 2 puffs into the lungs every 6 hours as needed for Wheezing 1 Inhaler 0   ??? LATANOPROST OP Apply to eye     ??? fluticasone (FLONASE) 50 MCG/ACT nasal spray 1 spray by Nasal route daily.       No current facility-administered medications for this visit.        There is no problem list on file for this patient.      Past Medical History:   Diagnosis Date   ??? Cancer (HCC)  prostate   ??? Hypertension        Past Surgical History:   Procedure Laterality Date   ??? PROSTATECTOMY         No Known Allergies    Social History     Socioeconomic History   ??? Marital status: Married     Spouse name: None   ??? Number of children: None   ??? Years of education: None   ??? Highest education level: None   Occupational History   ??? None   Social Needs   ??? Financial resource strain: None   ??? Food insecurity:     Worry: None     Inability: None   ??? Transportation needs:     Medical: None     Non-medical: None   Tobacco Use   ??? Smoking status: Former Smoker   ??? Smokeless tobacco: Never Used   Substance and Sexual Activity   ??? Alcohol use: Yes     Alcohol/week: 1.0 standard drinks     Types: 1 Glasses of wine per week     Comment: occ   ??? Drug use: No   ??? Sexual activity: None   Lifestyle   ??? Physical activity:     Days per week: None     Minutes per session: None   ??? Stress: None    Relationships   ??? Social connections:     Talks on phone: None     Gets together: None     Attends religious service: None     Active member of club or organization: None     Attends meetings of clubs or organizations: None     Relationship status: None   ??? Intimate partner violence:     Fear of current or ex partner: None     Emotionally abused: None     Physically abused: None     Forced sexual activity: None   Other Topics Concern   ??? None   Social History Narrative   ??? None       Review of Systems  As follows except as previously noted in HPI:  Constitutional: Negative for chills, diaphoresis, fatigue, fever and unexpected weight change.   Respiratory: Negative for cough, shortness of breath and wheezing.    Cardiovascular: Negative for chest pain and palpitations.   Neurological: Negative for dizziness, syncope, cephalgia.  GI / GU: negative  Musculoskeletal: see HPI       Objective:   Physical Exam   Constitutional: Oriented to person, place, and time. and appears well-developed and well-nourished. :   Head: Normocephalic and atraumatic.   Eyes: EOM are normal.   Neck: Neck supple.   Cardiovascular: Normal rate and regular rhythm.    Pulmonary/Chest: Effort normal. No stridor. No respiratory distress, no wheezes.   Abdominal:  No abnormal distension.    Neurological: Alert and oriented to person, place, and time.   Skin: Skin is warm and dry.   Psychiatric: Normal mood and affect. Behavior is normal. Thought content normal.    Lakita Sahlin Doristine Church, DO    07/09/18  9:10 AM

## 2018-07-30 ENCOUNTER — Ambulatory Visit
Admit: 2018-07-30 | Discharge: 2018-07-30 | Payer: MEDICARE | Attending: Orthopaedic Surgery | Primary: Internal Medicine

## 2018-07-30 DIAGNOSIS — S82832A Other fracture of upper and lower end of left fibula, initial encounter for closed fracture: Secondary | ICD-10-CM

## 2018-07-30 NOTE — Progress Notes (Signed)
Chief Complaint:   Chief Complaint   Patient presents with   ??? Follow-up     FOLLOW UP FROM LEFT FIBULA  FX DOI 06/29/2018, patient still has some swelling in his ankle.       Troy Mcdaniel is 4 weeks post injury to his left leg and foot.  He is doing a lot better than he was with decreased discomfort.  He is getting better range of motion in the ankle and foot.  He still has a little bit of a limp and a little bit of swelling.  He does not complain initially of tenderness in the proximal fibula area.  He has been working out some avoiding the exercises that would stress his foot and ankle too much.      Allergies; medications; past medical, surgical, family, and social history; and problem list have been reviewed today and updated as indicated in this encounter seen below.    Exam: There is mild tenderness in the proximal left fibula.  Alignment is good.  There is no edema proximally.  There is a little edema in the left ankle.  Range of motion overall is good with a stable joint.  There is minimal tenderness palpation.  Motor function is grossly intact.  He does walk with a very slight limp still.    Radiographs: None    ASSESSMENT:    Troy Mcdaniel was seen today for follow-up.    Diagnoses and all orders for this visit:    Closed fracture of proximal end of left fibula, unspecified fracture morphology, initial encounter    Sprain of left foot, initial encounter        PLAN: We discussed the injuries and the fact that he is doing well.  He should gradually increase activities avoiding a lot of twisting stress on his leg and at 8 weeks should be able to resume more aggressive exercises such as leg presses and an elliptical machine.  We will follow-up as needed.    Return if symptoms worsen or fail to improve.       Current Outpatient Medications   Medication Sig Dispense Refill   ??? latanoprost (XALATAN) 0.005 % ophthalmic solution      ??? montelukast (SINGULAIR) 10 MG tablet Take 10 mg by mouth nightly     ???  budesonide-formoterol (SYMBICORT) 160-4.5 MCG/ACT AERO Inhale 2 puffs into the lungs 2 times daily     ??? losartan (COZAAR) 50 MG tablet Take 50 mg by mouth daily     ??? timolol (TIMOPTIC) 0.25 % ophthalmic solution 1 drop 2 times daily     ??? Multiple Vitamins-Minerals (THERAPEUTIC MULTIVITAMIN-MINERALS) tablet Take 1 tablet by mouth daily     ??? albuterol sulfate HFA (PROAIR HFA) 108 (90 BASE) MCG/ACT inhaler Inhale 2 puffs into the lungs every 6 hours as needed for Wheezing 1 Inhaler 0     No current facility-administered medications for this visit.        There is no problem list on file for this patient.      Past Medical History:   Diagnosis Date   ??? Cancer [facility])     prostate   ??? Hypertension        Past Surgical History:   Procedure Laterality Date   ??? PROSTATECTOMY         No Known Allergies    Social History     Socioeconomic History   ??? Marital status: Married     Spouse name: None   ???  Number of children: None   ??? Years of education: None   ??? Highest education level: None   Occupational History   ??? None   Social Needs   ??? Financial resource strain: None   ??? Food insecurity:     Worry: None     Inability: None   ??? Transportation needs:     Medical: None     Non-medical: None   Tobacco Use   ??? Smoking status: Former Smoker   ??? Smokeless tobacco: Never Used   Substance and Sexual Activity   ??? Alcohol use: Yes     Alcohol/week: 1.0 standard drinks     Types: 1 Glasses of wine per week     Comment: occ   ??? Drug use: No   ??? Sexual activity: None   Lifestyle   ??? Physical activity:     Days per week: None     Minutes per session: None   ??? Stress: None   Relationships   ??? Social connections:     Talks on phone: None     Gets together: None     Attends religious service: None     Active member of club or organization: None     Attends meetings of clubs or organizations: None     Relationship status: None   ??? Intimate partner violence:     Fear of current or ex partner: None     Emotionally abused: None     Physically  abused: None     Forced sexual activity: None   Other Topics Concern   ??? None   Social History Narrative   ??? None       Review of Systems  As follows except as previously noted in HPI:  Constitutional: Negative for chills, diaphoresis, fatigue, fever and unexpected weight change.   Respiratory: Negative for cough, shortness of breath and wheezing.    Cardiovascular: Negative for chest pain and palpitations.   Neurological: Negative for dizziness, syncope, cephalgia.  GI / GU: negative  Musculoskeletal: see HPI       Objective:   Physical Exam   Constitutional: Oriented to person, place, and time. and appears well-developed and well-nourished. :   Head: Normocephalic and atraumatic.   Eyes: EOM are normal.   Neck: Neck supple.   Cardiovascular: Normal rate and regular rhythm.    Pulmonary/Chest: Effort normal. No stridor. No respiratory distress, no wheezes.   Abdominal:  No abnormal distension.    Neurological: Alert and oriented to person, place, and time.   Skin: Skin is warm and dry.   Psychiatric: Normal mood and affect. Behavior is normal. Thought content normal.    Troy Mcdaniel Doristine Church, DO    07/30/18  8:33 AM

## 2018-08-09 DIAGNOSIS — E785 Hyperlipidemia, unspecified: Secondary | ICD-10-CM | POA: Diagnosis not present

## 2018-08-09 DIAGNOSIS — Z8249 Family history of ischemic heart disease and other diseases of the circulatory system: Secondary | ICD-10-CM | POA: Diagnosis not present

## 2018-08-09 LAB — COMPREHENSIVE METABOLIC PANEL
ALK PHOS: 74 IU/L (ref 39–117)
ALT: 20 IU/L (ref 0–44)
AST: 18 IU/L (ref 0–40)
Albumin/Globulin Ratio: 2 (ref 1.2–2.2)
Albumin: 4 g/dL (ref 3.8–4.8)
BILIRUBIN TOTAL: 1.1 mg/dL (ref 0.0–1.2)
BUN/Creatinine Ratio: 13 (ref 10–24)
BUN: 12 mg/dL (ref 8–27)
CO2: 22 mmol/L (ref 20–29)
CREATININE: 0.92 mg/dL (ref 0.76–1.27)
Calcium: 8.7 mg/dL (ref 8.6–10.2)
Chloride: 105 mmol/L (ref 96–106)
GFR calc non Af Amer: 84 mL/min/{1.73_m2} (ref >59)
GFR, EST AFRICAN AMERICAN: 97 mL/min/{1.73_m2} (ref >59)
GLOBULIN, TOTAL: 2 g/dL (ref 1.5–4.5)
GLUCOSE: 109 mg/dL — AB (ref 65–99)
Potassium: 4.6 mmol/L (ref 3.5–5.2)
SODIUM: 142 mmol/L (ref 134–144)
TOTAL PROTEIN: 6 g/dL (ref 6.0–8.5)

## 2018-08-09 LAB — LIPID PANEL
CHOL/HDL RATIO: 2.7 ratio (ref 0.0–5.0)
Cholesterol, Total: 156 mg/dL (ref 100–199)
HDL: 57 mg/dL (ref >39)
LDL Calculated: 71 mg/dL (ref 0–99)
Triglycerides: 138 mg/dL (ref 0–149)
VLDL Cholesterol Cal: 28 mg/dL (ref 5–40)

## 2018-08-11 ENCOUNTER — Ambulatory Visit: Payer: Medicare Other | Admitting: Physician Assistant

## 2018-08-11 ENCOUNTER — Encounter: Payer: Self-pay | Admitting: Physician Assistant

## 2018-08-11 VITALS — BP 130/78 | HR 68 | Ht 69.5 in | Wt 196.6 lb

## 2018-08-11 DIAGNOSIS — Z8249 Family history of ischemic heart disease and other diseases of the circulatory system: Secondary | ICD-10-CM

## 2018-08-11 DIAGNOSIS — E785 Hyperlipidemia, unspecified: Secondary | ICD-10-CM | POA: Diagnosis not present

## 2018-08-11 DIAGNOSIS — I700 Atherosclerosis of aorta: Secondary | ICD-10-CM | POA: Diagnosis not present

## 2018-08-11 NOTE — Patient Instructions (Signed)
Medication Instructions:  Your physician recommends that you continue on your current medications as directed. Please refer to the Current Medication list given to you today.  If you need a refill on your cardiac medications before your next appointment, please call your pharmacy.   Lab work:  NONE  If you have labs (blood work) drawn today and your tests are completely normal, you will receive your results only by: Marland Kitchen MyChart Message (if you have MyChart) OR . A paper copy in the mail If you have any lab test that is abnormal or we need to change your treatment, we will call you to review the results.  Testing/Procedures:  NONE   Follow-Up: . Your physician recommends that you KEEP your scheduled appointment with Dr. Claiborne Billings    Any Other Special Instructions Will Be Listed Below (If Applicable).  Have your ECHO DONE IN October 2020

## 2018-08-11 NOTE — Progress Notes (Signed)
Cardiology Office Note    Date:  08/13/2018   ID:  Theodore Davis, Theodore Davis 1946-04-18, MRN [id number]  PCP:  Theodore Frizzle, MD  Cardiologist: Dr. Claiborne Billings  Chief Complaint  Patient presents with  . Follow-up    seen for Dr. Claiborne Billings.     History of Present Illness:  Theodore Davis is a 73 y.o. male with past medical history of hyperlipidemia and significant family history of CAD.  He had a normal echocardiogram and myocardial perfusion stress test in 2012.  Echocardiogram in 2016 showed normal EF and grade 1 DD.  His EKG demonstrated inferior Q waves.  However he did not have any chest pain in the past.  Repeat echocardiogram in 2018 was unchanged.  He has history of aortic atherosclerosis seen on CT and also enlarged mediastinal lymph nodes which was felt most likely to be reactive in etiology.  Patient presents today for cardiology office visit.  Although the previous note by Dr. Claiborne Billings mentioned he can follow-up in 12 months, however his wife mentioned they were told to follow-up in 3 months.  Otherwise he denies any obvious chest discomfort or shortness of breath.  On physical exam, his heart murmur is barely noticeable.  We spent office visit discussing prevention measures for his cholesterol.  We advised him to increase activity level and watch his diet.  He apparently enjoys high fatty diet.  His most recent lipid panel however shows very well-controlled cholesterol and the LDL is still near the borderline at 71.  LDL goal should be less than 70 given family history of history of aortic atherosclerosis.  Overall I think he is doing well and I recommended him to continue on the current medication and control the cholesterol through diet and exercise.   Past Medical History:  Diagnosis Date  . Arthritis 10/03/2010   right sternoclavicular joint  . Heart murmur   . Hemangioma of spine    lower lumbar  . HOH (hard of hearing)    R ear  . Hyperlipidemia    hypercholesterolemia  . Lyme  disease     Past Surgical History:  Procedure Laterality Date  . CARDIOVASCULAR STRESS TEST  08/2010  . CHOLECYSTECTOMY    . HERNIA REPAIR     R inguinal  . JOINT REPLACEMENT Right   . MASTOID DEBRIDEMENT     R ear  . RESECTION DISTAL CLAVICAL Left 11/30/2014   Procedure: RESECTION DISTAL CLAVICAL;  Surgeon: Kathryne Hitch, MD;  Location: [facility];  Service: Orthopedics;  Laterality: Left;  . SHOULDER ARTHROSCOPY WITH BICEPSTENOTOMY Left 11/30/2014   Procedure: SHOULDER ARTHROSCOPY WITH BICEPSTENOTOMY;  Surgeon: Kathryne Hitch, MD;  Location: [facility];  Service: Orthopedics;  Laterality: Left;  . SHOULDER ARTHROSCOPY WITH ROTATOR CUFF REPAIR AND SUBACROMIAL DECOMPRESSION Left 11/30/2014   Procedure: LEFT SHOULDER ARTHROSCOPY WITH DISTAL CLAVICAL EXCISION, ACROMIOPLASTY, ROTATOR CUFF REPAIR , BICEPS TENOTOMY;  Surgeon: Kathryne Hitch, MD;  Location: [facility];  Service: Orthopedics;  Laterality: Left;  . TENDON REPAIR  2005   R elbow  . TOTAL KNEE ARTHROPLASTY  05/28/2011   Procedure: TOTAL KNEE ARTHROPLASTY;  Surgeon: Ninetta Lights, MD;  Location: [facility];  Service: Orthopedics;  Laterality: Right;  Right total knee arthroplasty   . US ECHOCARDIOGRAPHY  08/2010    Current Medications: Outpatient Medications Prior to Visit  Medication Sig Dispense Refill  . magnesium oxide (MAG-OX) 400 MG tablet Take 400 mg by mouth daily.     Marland Kitchen  RAPAFLO 8 MG CAPS capsule Take 1 capsule (8 mg total) by mouth daily with breakfast. 90 capsule 3  . simvastatin (ZOCOR) 40 MG tablet TAKE 1 TABLET BY MOUTH  NIGHTLY AT BEDTIME 90 tablet 3   No facility-administered medications prior to visit.      Allergies:   Codeine and Lipitor [atorvastatin calcium]   Social History   Socioeconomic History  . Marital status: Married    Spouse name: Not on file  . Number of children: Not on file  . Years of education: Not on file  . Highest education level: Not on file    Occupational History  . Not on file  Social Needs  . Financial resource strain: Not on file  . Food insecurity:    Worry: Not on file    Inability: Not on file  . Transportation needs:    Medical: Not on file    Non-medical: Not on file  Tobacco Use  . Smoking status: Former Smoker    Packs/day: 0.25    Years: 50.00    Pack years: 12.50    Last attempt to quit: 06/23/1997    Years since quitting: 21.1  . Smokeless tobacco: Former Network engineer and Sexual Activity  . Alcohol use: Yes    Comment: occasional beer  . Drug use: No  . Sexual activity: Yes  Lifestyle  . Physical activity:    Days per week: Not on file    Minutes per session: Not on file  . Stress: Not on file  Relationships  . Social connections:    Talks on phone: Not on file    Gets together: Not on file    Attends religious service: Not on file    Active member of club or organization: Not on file    Attends meetings of clubs or organizations: Not on file    Relationship status: Not on file  Other Topics Concern  . Not on file  Social History Narrative   [facility] (04/08/17):   Originally from [facility]. Has lived in [place] as well. Previously worked for the Education officer, environmental as a Dealer. Has 2 dogs & a rabbit currently. No bird exposure. He reports black mold was found in his air conditioner at [facility] when it was changed out 2 years ago. No hot tub exposure. Enjoys fishing.      Family History:  The patient's family history includes Atrial fibrillation in his father and mother; Bladder Cancer in his father; Brain cancer in his maternal uncle; Heart disease in his father and mother; Rheum arthritis in his father.   ROS:   Please see the history of present illness.    ROS All other systems reviewed and are negative.   PHYSICAL EXAM:   VS:  BP 130/78   Pulse 68   Ht 5' 9.5" (1.765 m)   Wt 196 lb 9.6 oz (89.2 kg)   SpO2 98%   BMI 28.62 kg/m    GEN: Well nourished, well developed, in no acute  distress  HEENT: normal  Neck: no JVD, carotid bruits, or masses Cardiac: RRR; no murmurs, rubs, or gallops,no edema  Respiratory:  clear to auscultation bilaterally, normal work of breathing GI: soft, nontender, nondistended, + BS MS: no deformity or atrophy  Skin: warm and dry, no rash Neuro:  Alert and Oriented x 3, Strength and sensation are intact Psych: euthymic mood, full affect  Wt Readings from Last 3 Encounters:  08/11/18 196 lb 9.6 oz (89.2 kg)  05/14/18 194 lb 9.6 oz (88.3 kg)  05/06/18 193 lb (87.5 kg)      Studies/Labs Reviewed:   EKG:  EKG is not ordered today.   Recent Labs: 01/13/2018: Hemoglobin 16.1; Platelets 228 08/09/2018: ALT 20; BUN 12; Creatinine, Ser 0.92; Potassium 4.6; Sodium 142   Lipid Panel    Component Value Date/Time   CHOL 156 08/09/2018 0923   TRIG 138 08/09/2018 0923   HDL 57 08/09/2018 0923   CHOLHDL 2.7 08/09/2018 0923   CHOLHDL 2.8 01/13/2018 1030   VLDL 16 12/29/2016 0951   LDLCALC 71 08/09/2018 0923   LDLCALC 83 01/13/2018 1030    Additional studies/ records that were reviewed today include:   Echo 04/21/2017 LV EF: 55% -   60% Study Conclusions  - Left ventricle: The cavity size was normal. There was mild focal   basal hypertrophy of the septum. Systolic function was normal.   The estimated ejection fraction was in the range of 55% to 60%.   Wall motion was normal; there were no regional wall motion   abnormalities. Doppler parameters are consistent with abnormal   left ventricular relaxation (grade 1 diastolic dysfunction). - Aortic valve: Trileaflet; moderately thickened, moderately   calcified leaflets. Valve mobility was mildly restricted.   Transvalvular velocity was within the normal range. There was no   stenosis.    ASSESSMENT:    1. Hyperlipidemia, unspecified hyperlipidemia type   2. Aortic atherosclerosis (HCC)   3. FHx: heart disease      PLAN:  In order of problems listed  above:  1. Hyperlipidemia: Last lipid panel showed very well-controlled cholesterol, LDL however was borderline elevated at 71.  Given history of aortic atherosclerosis and family history of heart disease, LDL goal should be less than 70.  We will continue on the current medication and recommend diet activity  2. Aortic atherosclerosis: Asymptomatic, found incidentally on the previous CT scan  3. Family history of heart disease: Patient does not have significant history of coronary artery disease himself.  He denies any exertional chest discomfort or shortness of breath.    Medication Adjustments/Labs and Tests Ordered: Current medicines are reviewed at length with the patient today.  Concerns regarding medicines are outlined above.  Medication changes, Labs and Tests ordered today are listed in the Patient Instructions below. Patient Instructions  Medication Instructions:  Your physician recommends that you continue on your current medications as directed. Please refer to the Current Medication list given to you today.  If you need a refill on your cardiac medications before your next appointment, please call your pharmacy.   Lab work:  NONE  If you have labs (blood work) drawn today and your tests are completely normal, you will receive your results only by: Marland Kitchen MyChart Message (if you have MyChart) OR . A paper copy in the mail If you have any lab test that is abnormal or we need to change your treatment, we will call you to review the results.  Testing/Procedures:  NONE   Follow-Up: . Your physician recommends that you KEEP your scheduled appointment with Dr. Claiborne Billings    Any Other Special Instructions Will Be Listed Below (If Applicable).  Have your ECHO DONE IN October 2020     Sherley Bounds [place], [place]  08/13/2018 9:40 AM    [facility] [place], [place], [place]  [address] Phone: [phone]; Fax: [phone]

## 2018-08-13 ENCOUNTER — Encounter: Payer: Self-pay | Admitting: Physician Assistant

## 2018-08-26 DIAGNOSIS — H2513 Age-related nuclear cataract, bilateral: Secondary | ICD-10-CM | POA: Diagnosis not present

## 2018-08-26 DIAGNOSIS — H524 Presbyopia: Secondary | ICD-10-CM | POA: Diagnosis not present

## 2018-08-26 DIAGNOSIS — H5203 Hypermetropia, bilateral: Secondary | ICD-10-CM | POA: Diagnosis not present

## 2018-08-26 DIAGNOSIS — H52203 Unspecified astigmatism, bilateral: Secondary | ICD-10-CM | POA: Diagnosis not present

## 2018-08-30 ENCOUNTER — Inpatient Hospital Stay: Admit: 2018-08-30 | Discharge: 2018-08-30 | Disposition: A | Discharge status: Home / Self Care | Payer: MEDICARE

## 2018-08-30 DIAGNOSIS — J40 Bronchitis, not specified as acute or chronic: Secondary | ICD-10-CM

## 2018-08-30 MED ORDER — AZITHROMYCIN 250 MG PO TABS
250 MG | ORAL_TABLET | ORAL | 0 refills | Status: AC
Start: 2018-08-30 — End: 2018-09-09

## 2018-08-30 MED ORDER — ALBUTEROL SULFATE HFA 108 (90 BASE) MCG/ACT IN AERS
10890 (90 Base) MCG/ACT | Freq: Four times a day (QID) | RESPIRATORY_TRACT | 0 refills | Status: AC | PRN
Start: 2018-08-30 — End: 2018-09-06

## 2018-08-30 MED ORDER — PREDNISONE 20 MG PO TABS
20 MG | ORAL_TABLET | Freq: Every day | ORAL | 0 refills | Status: AC
Start: 2018-08-30 — End: 2018-09-04

## 2018-08-30 NOTE — ED Provider Notes (Signed)
Department of Emergency Medicine   [facility]  Provider Note  Admit Date/RoomTime: 08/30/2018  5:46 PM  Room: 03/03    Chief Complaint   Chest Congestion (Pt states "I'm at the end of a bad cold & I have a rattle in my chest I can't get rid of & I'm prone to bronchitis" )    History of Present Illness   Source of history provided by:  patient.  History/Exam Limitations: mental status.       Troy Mcdaniel is a 73 y.o. old male who has a past medical history of:   Past Medical History:   Diagnosis Date   ??? Asthma    ??? Cancer (HCC)     prostate   ??? Hypertension     presents to the urgent care center  by private vehicle, with complaints of chest congestion, wheezing, and cough.  He has some upper respiratory/sinus symptoms also.  This just started a few days ago.  He said he does have a history of asthma but his inhaler is old.  He is not short of breath is not achy does not have a fever or chills does not have body aches.    ROS   Pertinent positives and negatives are stated within HPI, all other systems reviewed and are negative.    Past Surgical History:   Procedure Laterality Date   ??? PROSTATECTOMY     Social History:  reports that he has quit smoking. He has never used smokeless tobacco. He reports current alcohol use of about 1.0 standard drinks of alcohol per week. He reports that he does not use drugs.  Family History: family history includes Cancer in his father; Dementia in his mother.  Allergies: Patient has no known allergies.    Physical Exam           ED Triage Vitals [08/30/18 1747]   BP Temp Temp Source Pulse Resp SpO2 Height Weight   129/84 98.4 ??F (36.9 ??C) Oral 72 16 96 % 6\' 2"  (1.88 m) 215 lb (97.5 kg)      Oxygen Saturation Interpretation: Normal.    Constitutional:  Alert, development consistent with age.  HEENT:  NC/NT.  Airway patent. Bilateral TMs normal without signs of infection, pharynx not erythematous no tonsillar enlargement no uvular swelling.  Neck:   Normal ROM.  Supple.  Respiratory:  Breath sounds: equal bilaterally.        Lung sounds: normal and wheezing- throughout.   CV:  Regular rate and rhythm, normal heart sounds, without pathological murmurs, ectopy, gallops, or rubs..  GI:  Abdomen Soft, nontender, good bowel sounds.  No firm or pulsatile mass.  Integument:  Normal turgor.  Warm, dry, without visible rash.  Lymphatic:  Edema:  none Bilateral lower extremity(s).  Neurological:  Oriented.  Motor functions intact.    Lab / Imaging Results   (All laboratory and radiology results have been personally reviewed by myself)  Labs:  No results found for this visit on 08/30/18.  Imaging:  All Radiology results interpreted by Radiologist unless otherwise noted.  No orders to display       ED Course / Medical Decision Making   Medications - No data to display     Consult(s):   None  MDM:   He does have a history of asthma he has some increased wheezing and a cough that is productive does not have chest pain shortness of breath fever chills or body aches nothing  to suggest influenza I did put him on a Z-Pak, prednisone, and I did refill his albuterol inhaler advised him to get reevaluated if anything worsens.  He should also follow-up with his PCP       Plan of Care: Normal progression of disease discussed.  All questions answered.  Explained the rationale for symptomatic treatment rather than use of an antibiotic.  Instruction provided in the use of fluids, vaporizer, acetaminophen, and other OTC medication for symptom control.  Extra fluids  Analgesics as needed, dose reviewed.  Follow up as needed should symptoms fail to improve.      Assessment      1. Bronchitis    2. Upper respiratory tract infection, unspecified type      Plan   Discharge to home and advised to contact Ninfa Linden, MD  [address]  [place] [address]  [phone]      As needed   Patient condition is good    New Medications     New Prescriptions    AZITHROMYCIN (ZITHROMAX Z-PAK) 250  MG TABLET    Take 2 tabs on day one, followed by 1 tablet daily for 4 days.    PREDNISONE (DELTASONE) 20 MG TABLET    Take 2 tablets by mouth daily for 5 days     Electronically signed by Reino Kent, APRN - CNP   DD: 08/30/18  **This report was transcribed using voice recognition software. Every effort was made to ensure accuracy; however, inadvertent computerized transcription errors may be present.  END OF ED PROVIDER NOTE     Reino Kent, APRN - CNP  08/30/18 1826

## 2019-01-19 ENCOUNTER — Other Ambulatory Visit: Payer: Medicare Other

## 2019-01-19 DIAGNOSIS — R6889 Other general symptoms and signs: Secondary | ICD-10-CM | POA: Diagnosis not present

## 2019-01-19 DIAGNOSIS — Z20822 Contact with and (suspected) exposure to covid-19: Secondary | ICD-10-CM

## 2019-01-19 NOTE — Addendum Note (Signed)
Addended by: Vella Kohler D on: 01/19/2019 08:34 AM   Modules accepted: Orders

## 2019-01-21 LAB — NOVEL CORONAVIRUS, NAA: SARS-CoV-2, NAA: NOT DETECTED

## 2019-01-25 ENCOUNTER — Telehealth: Payer: Self-pay | Admitting: General Practice

## 2019-01-25 ENCOUNTER — Telehealth: Payer: Self-pay | Admitting: Family Medicine

## 2019-01-25 NOTE — Telephone Encounter (Signed)
Pt made aware of negative results, expressed understanding

## 2019-02-04 ENCOUNTER — Ambulatory Visit (HOSPITAL_COMMUNITY): Payer: Medicare Other | Attending: Cardiology

## 2019-02-04 ENCOUNTER — Other Ambulatory Visit: Payer: Self-pay

## 2019-02-04 DIAGNOSIS — I358 Other nonrheumatic aortic valve disorders: Secondary | ICD-10-CM | POA: Insufficient documentation

## 2019-02-08 DIAGNOSIS — R3912 Poor urinary stream: Secondary | ICD-10-CM | POA: Diagnosis not present

## 2019-04-04 ENCOUNTER — Other Ambulatory Visit: Payer: Self-pay

## 2019-04-04 DIAGNOSIS — Z20822 Contact with and (suspected) exposure to covid-19: Secondary | ICD-10-CM

## 2019-04-05 LAB — NOVEL CORONAVIRUS, NAA: SARS-CoV-2, NAA: NOT DETECTED

## 2019-07-05 ENCOUNTER — Ambulatory Visit: Payer: Medicare Other | Attending: Internal Medicine

## 2019-07-11 ENCOUNTER — Other Ambulatory Visit: Payer: Self-pay | Admitting: Cardiovascular Disease

## 2019-08-08 DIAGNOSIS — S8001XA Contusion of right knee, initial encounter: Secondary | ICD-10-CM | POA: Diagnosis not present

## 2019-08-09 ENCOUNTER — Other Ambulatory Visit: Payer: Self-pay | Admitting: Cardiovascular Disease

## 2019-08-14 ENCOUNTER — Ambulatory Visit: Payer: Medicare Other | Attending: Internal Medicine

## 2019-08-14 DIAGNOSIS — Z23 Encounter for immunization: Secondary | ICD-10-CM | POA: Insufficient documentation

## 2019-08-14 NOTE — Progress Notes (Signed)
   Covid-19 Vaccination Clinic  Name:  Theodore Davis    MRN: [id number] DOB: 02-Mar-1946  08/14/2019  [name] was observed post Covid-19 immunization for 15 minutes without incidence. He was provided with Vaccine Information Sheet and instruction to access the V-Safe system.   [name] was instructed to call 911 with any severe reactions post vaccine: Marland Kitchen Difficulty breathing  . Swelling of your face and throat  . A fast heartbeat  . A bad rash all over your body  . Dizziness and weakness    Immunizations Administered    Name Date Dose VIS Date Route   Pfizer COVID-19 Vaccine 08/14/2019  1:00 PM 0.3 mL 06/03/2019 Intramuscular   Manufacturer: [place]   Lot: [id number]   [place]: [id number]

## 2019-09-01 DIAGNOSIS — H524 Presbyopia: Secondary | ICD-10-CM | POA: Diagnosis not present

## 2019-09-01 DIAGNOSIS — H5203 Hypermetropia, bilateral: Secondary | ICD-10-CM | POA: Diagnosis not present

## 2019-09-01 DIAGNOSIS — H2513 Age-related nuclear cataract, bilateral: Secondary | ICD-10-CM | POA: Diagnosis not present

## 2019-09-01 DIAGNOSIS — H25013 Cortical age-related cataract, bilateral: Secondary | ICD-10-CM | POA: Diagnosis not present

## 2019-09-01 DIAGNOSIS — H52203 Unspecified astigmatism, bilateral: Secondary | ICD-10-CM | POA: Diagnosis not present

## 2019-09-06 ENCOUNTER — Ambulatory Visit: Payer: Medicare Other | Attending: Internal Medicine

## 2019-09-06 DIAGNOSIS — Z23 Encounter for immunization: Secondary | ICD-10-CM

## 2019-09-06 NOTE — Progress Notes (Signed)
   Covid-19 Vaccination Clinic  Name:  Theodore Davis    MRN: [id number] DOB: Dec 06, 1945  09/06/2019  [name] was observed post Covid-19 immunization for 15 minutes without incident. He was provided with Vaccine Information Sheet and instruction to access the V-Safe system.   [name] was instructed to call 911 with any severe reactions post vaccine: Marland Kitchen Difficulty breathing  . Swelling of face and throat  . A fast heartbeat  . A bad rash all over body  . Dizziness and weakness   Immunizations Administered    Name Date Dose VIS Date Route   Pfizer COVID-19 Vaccine 09/06/2019  8:27 AM 0.3 mL 06/03/2019 Intramuscular   Manufacturer: [place]   Lot: [id number]   [place]: [id number]

## 2019-09-11 ENCOUNTER — Other Ambulatory Visit: Payer: Self-pay | Admitting: Cardiovascular Disease

## 2019-09-13 DIAGNOSIS — R350 Frequency of micturition: Secondary | ICD-10-CM | POA: Diagnosis not present

## 2019-09-14 ENCOUNTER — Other Ambulatory Visit: Payer: Self-pay

## 2019-09-14 NOTE — Patient Outreach (Signed)
[facility]) Care Management  09/14/2019  BRANNDON DIVIN 1945/11/07 [id number]   Medication Adherence call to Mr. Mahki Speno Telephone call to Patient regarding Medication Adherence unable to reach patient,patient did not answer. Mr. Alsup is showing past due on Simvastatin 40 mg under [place].   [place] Management Direct Dial [phone]  Fax [phone] [email or web address]

## 2019-09-18 ENCOUNTER — Other Ambulatory Visit: Payer: Self-pay | Admitting: Cardiovascular Disease

## 2019-09-21 ENCOUNTER — Ambulatory Visit (INDEPENDENT_AMBULATORY_CARE_PROVIDER_SITE_OTHER): Payer: Medicare Other | Admitting: Nurse Practitioner

## 2019-09-21 ENCOUNTER — Other Ambulatory Visit: Payer: Self-pay

## 2019-09-21 VITALS — BP 110/76 | HR 57 | Temp 98.0°F | Resp 18 | Wt 199.8 lb

## 2019-09-21 DIAGNOSIS — Z8639 Personal history of other endocrine, nutritional and metabolic disease: Secondary | ICD-10-CM

## 2019-09-21 DIAGNOSIS — Z13228 Encounter for screening for other metabolic disorders: Secondary | ICD-10-CM | POA: Diagnosis not present

## 2019-09-21 DIAGNOSIS — S39012A Strain of muscle, fascia and tendon of lower back, initial encounter: Secondary | ICD-10-CM

## 2019-09-21 DIAGNOSIS — E785 Hyperlipidemia, unspecified: Secondary | ICD-10-CM

## 2019-09-21 DIAGNOSIS — N400 Enlarged prostate without lower urinary tract symptoms: Secondary | ICD-10-CM | POA: Diagnosis not present

## 2019-09-21 MED ORDER — KETOROLAC TROMETHAMINE 30 MG/ML IJ SOLN
30.0000 mg | Freq: Once | INTRAMUSCULAR | Status: AC
  Administered 2019-09-21: 30 mg via INTRAMUSCULAR

## 2019-09-21 NOTE — Progress Notes (Signed)
Acute Office Visit  Subjective:    Patient ID: Theodore Davis, male    DOB: [birth year], 74 y.o.   MRN: [id number]  Chief Complaint  Patient presents with  . Back Pain    started 03/27, pulling a dolly with a washing machine and hurt lower R back, tylenol was taken with no relief    HPI Theodore Davis is a 74 year old male presenting to clinic for symptoms of right side lumbar pain. The pain started 2 days ago one 2 days after he moved a heavy dryer machine. He denied known injury. The pain is described as tight and spasm come and go,  No sciatica. Pain level 8/10 facial feels stiff with guarded movement r/t pain. No cp/ct, gu/gi sxs, no other pain, sob, edema, no recent falls.  Past Medical History:  Diagnosis Date  . Arthritis 10/03/2010   right sternoclavicular joint  . Heart murmur   . Hemangioma of spine    lower lumbar  . HOH (hard of hearing)    R ear  . Hyperlipidemia    hypercholesterolemia  . Lyme disease     Past Surgical History:  Procedure Laterality Date  . CARDIOVASCULAR STRESS TEST  08/2010  . CHOLECYSTECTOMY    . HERNIA REPAIR     R inguinal  . JOINT REPLACEMENT Right   . MASTOID DEBRIDEMENT     R ear  . RESECTION DISTAL CLAVICAL Left 11/30/2014   Procedure: RESECTION DISTAL CLAVICAL;  Surgeon: Kathryne Hitch, MD;  Location: [place];  Service: Orthopedics;  Laterality: Left;  . SHOULDER ARTHROSCOPY WITH BICEPSTENOTOMY Left 11/30/2014   Procedure: SHOULDER ARTHROSCOPY WITH BICEPSTENOTOMY;  Surgeon: Kathryne Hitch, MD;  Location: [place];  Service: Orthopedics;  Laterality: Left;  . SHOULDER ARTHROSCOPY WITH ROTATOR CUFF REPAIR AND SUBACROMIAL DECOMPRESSION Left 11/30/2014   Procedure: LEFT SHOULDER ARTHROSCOPY WITH DISTAL CLAVICAL EXCISION, ACROMIOPLASTY, ROTATOR CUFF REPAIR , BICEPS TENOTOMY;  Surgeon: Kathryne Hitch, MD;  Location: [place];  Service: Orthopedics;  Laterality: Left;  . TENDON REPAIR  2005   R  elbow  . TOTAL KNEE ARTHROPLASTY  05/28/2011   Procedure: TOTAL KNEE ARTHROPLASTY;  Surgeon: Ninetta Lights, MD;  Location: [place];  Service: Orthopedics;  Laterality: Right;  Right total knee arthroplasty   . US ECHOCARDIOGRAPHY  08/2010    Family History  Problem Relation Age of Onset  . Heart disease Mother   . Atrial fibrillation Mother   . Heart disease Father   . Rheum arthritis Father   . Bladder Cancer Father   . Atrial fibrillation Father   . Brain cancer Maternal Uncle   . Lung disease Neg Hx     Social History   Socioeconomic History  . Marital status: Married    Spouse name: Not on file  . Number of children: Not on file  . Years of education: Not on file  . Highest education level: Not on file  Occupational History  . Not on file  Tobacco Use  . Smoking status: Former Smoker    Packs/day: 0.25    Years: 50.00    Pack years: 12.50    Quit date: 06/23/1997    Years since quitting: 22.2  . Smokeless tobacco: Former Network engineer and Sexual Activity  . Alcohol use: Yes    Comment: occasional beer  . Drug use: No  . Sexual activity: Yes  Other Topics Concern  . Not on file  Social History Narrative  [facility] (04/08/17):   Originally from [facility]. Has lived in [place] as well. Previously worked for the Education officer, environmental as a Dealer. Has 2 dogs & a rabbit currently. No bird exposure. He reports black mold was found in his air conditioner at [facility] when it was changed out 2 years ago. No hot tub exposure. Enjoys fishing.    Social Determinants of Health   Financial Resource Strain:   . Difficulty of Paying Living Expenses:   Food Insecurity:   . Worried About Charity fundraiser in the Last Year:   . Arboriculturist in the Last Year:   Transportation Needs:   . Film/video editor (Medical):   Marland Kitchen Lack of Transportation (Non-Medical):   Physical Activity:   . Days of Exercise per Week:   . Minutes of Exercise per Session:   Stress:   . Feeling  of Stress :   Social Connections:   . Frequency of Communication with Friends and Family:   . Frequency of Social Gatherings with Friends and Family:   . Attends Religious Services:   . Active Member of Clubs or Organizations:   . Attends Archivist Meetings:   Marland Kitchen Marital Status:   Intimate Partner Violence:   . Fear of Current or Ex-Partner:   . Emotionally Abused:   Marland Kitchen Physically Abused:   . Sexually Abused:     Outpatient Medications Prior to Visit  Medication Sig Dispense Refill  . magnesium oxide (MAG-OX) 400 MG tablet Take 400 mg by mouth daily.     Marland Kitchen RAPAFLO 8 MG CAPS capsule Take 1 capsule (8 mg total) by mouth daily with breakfast. 90 capsule 3  . simvastatin (ZOCOR) 40 MG tablet TAKE 1 TABLET BY MOUTH EVERYDAY AT BEDTIME. PLEASE SCHEDULE ANNUAL APPT WITH DR. Claiborne Billings FOR REFILLS 30 tablet 0   No facility-administered medications prior to visit.    Allergies  Allergen Reactions  . Codeine Nausea And Vomiting  . Lipitor [Atorvastatin Calcium] Other (See Comments)    myalgias    Review of Systems  All other systems reviewed and are negative.      Objective:    Physical Exam Vitals and nursing note reviewed.  Constitutional:      Appearance: Normal appearance. He is well-groomed.  HENT:     Head: Normocephalic.     Nose: Nose normal.  Eyes:     Extraocular Movements: Extraocular movements intact.     Conjunctiva/sclera: Conjunctivae normal.     Pupils: Pupils are equal, round, and reactive to light.  Cardiovascular:     Rate and Rhythm: Normal rate and regular rhythm.     Pulses: Normal pulses.  Pulmonary:     Effort: Pulmonary effort is normal.     Breath sounds: Normal breath sounds.  Abdominal:     General: Abdomen is flat.     Palpations: Abdomen is soft.  Musculoskeletal:     Cervical back: Normal range of motion and neck supple.     Lumbar back: No swelling, edema, deformity, tenderness or bony tenderness. Decreased range of motion.        Back:     Comments: Discomfort with bending and lifting leg, without siatica  Skin:    General: Skin is warm and dry.     Capillary Refill: Capillary refill takes less than 2 seconds.  Neurological:     General: No focal deficit present.     Mental Status: He is alert and oriented to person,  place, and time.  Psychiatric:        Mood and Affect: Mood normal.        Behavior: Behavior normal. Behavior is cooperative.     BP 110/76 (BP Location: Left Arm, Patient Position: Sitting, Cuff Size: Normal)   Pulse (!) 57   Temp 98 F (36.7 C) (Temporal)   Resp 18   Wt 199 lb 12.8 oz (90.6 kg)   SpO2 96%   BMI 29.08 kg/m  Wt Readings from Last 3 Encounters:  09/21/19 199 lb 12.8 oz (90.6 kg)  08/11/18 196 lb 9.6 oz (89.2 kg)  05/14/18 194 lb 9.6 oz (88.3 kg)    Health Maintenance Due  Topic Date Due  . COLONOSCOPY  06/23/2018  . INFLUENZA VACCINE  01/22/2019    There are no preventive care reminders to display for this patient.   Lab Results  Component Value Date   TSH 0.30 (L) 11/10/2016   Lab Results  Component Value Date   WBC 5.8 01/13/2018   HGB 16.1 01/13/2018   HCT 46.8 01/13/2018   MCV 88.5 01/13/2018   PLT 228 01/13/2018   Lab Results  Component Value Date   NA 142 08/09/2018   K 4.6 08/09/2018   CO2 22 08/09/2018   GLUCOSE 109 (H) 08/09/2018   BUN 12 08/09/2018   CREATININE 0.92 08/09/2018   BILITOT 1.1 08/09/2018   ALKPHOS 74 08/09/2018   AST 18 08/09/2018   ALT 20 08/09/2018   PROT 6.0 08/09/2018   ALBUMIN 4.0 08/09/2018   CALCIUM 8.7 08/09/2018   ANIONGAP 6 04/07/2017   Lab Results  Component Value Date   CHOL 156 08/09/2018   Lab Results  Component Value Date   HDL 57 08/09/2018   Lab Results  Component Value Date   LDLCALC 71 08/09/2018   Lab Results  Component Value Date   TRIG 138 08/09/2018   Lab Results  Component Value Date   CHOLHDL 2.7 08/09/2018   Lab Results  Component Value Date   HGBA1C 5.5 01/13/2018         Assessment & Plan:  Your lumbar back pain is consistent with lumbar muscle strain. The initial treatment will be NSAIDS (ibuprofen 600mg  every 8 hours as needed) take pepcid 20mg  or on a full stomach. Administered Toradol 30mg  IM in clinic today with no reaction 10 minutes later. Pain prior 8/10 and pain after Toradol 1-2/10.   Return for worsening, non resolving, or worsening sxs.   Labs overdue will collect today.  Problem List Items Addressed This Visit      Genitourinary   Benign prostatic hyperplasia without lower urinary tract symptoms   Relevant Orders   PSA     Other   Hyperlipidemia LDL goal <70 - Primary   Relevant Orders   Hemoglobin A1c   COMPLETE METABOLIC PANEL WITH GFR   CBC with Differential/Platelet   Lipid panel   PSA    Other Visit Diagnoses    Screening for metabolic disorder       Relevant Orders   Hemoglobin A1c   COMPLETE METABOLIC PANEL WITH GFR   CBC with Differential/Platelet   Lipid panel   PSA   History of elevated glucose       Relevant Orders   Hemoglobin A1c   Strain of lumbar paraspinous muscle, initial encounter       Relevant Medications   ketorolac (TORADOL) 30 MG/ML injection 30 mg (Completed) (Start on 09/21/2019 10:15 AM)  Meds ordered this encounter  Medications  . ketorolac (TORADOL) 30 MG/ML injection 30 mg   Follow Up: Return for worsening, non resolving, or worsening sxs.   Annie Main, FNP

## 2019-09-22 LAB — COMPLETE METABOLIC PANEL WITH GFR
AG Ratio: 1.9 (calc) (ref 1.0–2.5)
ALT: 16 U/L (ref 9–46)
AST: 17 U/L (ref 10–35)
Albumin: 4 g/dL (ref 3.6–5.1)
Alkaline phosphatase (APISO): 64 U/L (ref 35–144)
BUN: 14 mg/dL (ref 7–25)
CO2: 27 mmol/L (ref 20–32)
Calcium: 8.6 mg/dL (ref 8.6–10.3)
Chloride: 107 mmol/L (ref 98–110)
Creat: 0.98 mg/dL (ref 0.70–1.18)
GFR, Est African American: 90 mL/min/{1.73_m2} (ref >=60)
GFR, Est Non African American: 77 mL/min/{1.73_m2} (ref >=60)
Globulin: 2.1 g/dL (calc) (ref 1.9–3.7)
Glucose, Bld: 115 mg/dL — ABNORMAL HIGH (ref 65–99)
Potassium: 4.6 mmol/L (ref 3.5–5.3)
Sodium: 141 mmol/L (ref 135–146)
Total Bilirubin: 1 mg/dL (ref 0.2–1.2)
Total Protein: 6.1 g/dL (ref 6.1–8.1)

## 2019-09-22 LAB — CBC WITH DIFFERENTIAL/PLATELET
Absolute Monocytes: 456 cells/uL (ref 200–950)
Basophils Absolute: 29 cells/uL (ref 0–200)
Basophils Relative: 0.5 %
Eosinophils Absolute: 108 cells/uL (ref 15–500)
Eosinophils Relative: 1.9 %
HCT: 44.4 % (ref 38.5–50.0)
Hemoglobin: 14.9 g/dL (ref 13.2–17.1)
Lymphs Abs: 1157 cells/uL (ref 850–3900)
MCH: 30.5 pg (ref 27.0–33.0)
MCHC: 33.6 g/dL (ref 32.0–36.0)
MCV: 91 fL (ref 80.0–100.0)
MPV: 10.9 fL (ref 7.5–12.5)
Monocytes Relative: 8 %
Neutro Abs: 3950 cells/uL (ref 1500–7800)
Neutrophils Relative %: 69.3 %
Platelets: 211 10*3/uL (ref 140–400)
RBC: 4.88 10*6/uL (ref 4.20–5.80)
RDW: 12.7 % (ref 11.0–15.0)
Total Lymphocyte: 20.3 %
WBC: 5.7 10*3/uL (ref 3.8–10.8)

## 2019-09-22 LAB — HEMOGLOBIN A1C
Hgb A1c MFr Bld: 5.5 % of total Hgb (ref <5.7)
Mean Plasma Glucose: 111 (calc)
eAG (mmol/L): 6.2 (calc)

## 2019-09-22 LAB — PSA: PSA: 0.3 ng/mL (ref <=4.0)

## 2019-09-22 LAB — LIPID PANEL
Cholesterol: 150 mg/dL (ref <200)
HDL: 58 mg/dL (ref >=40)
LDL Cholesterol (Calc): 77 mg/dL (calc)
Non-HDL Cholesterol (Calc): 92 mg/dL (calc) (ref <130)
Total CHOL/HDL Ratio: 2.6 (calc) (ref <5.0)
Triglycerides: 72 mg/dL (ref <150)

## 2019-09-28 ENCOUNTER — Other Ambulatory Visit: Payer: Self-pay | Admitting: Cardiovascular Disease

## 2019-09-28 NOTE — Telephone Encounter (Signed)
Rx request sent to pharmacy.  

## 2020-01-31 DIAGNOSIS — R339 Retention of urine, unspecified: Secondary | ICD-10-CM | POA: Diagnosis not present

## 2020-01-31 DIAGNOSIS — R350 Frequency of micturition: Secondary | ICD-10-CM | POA: Diagnosis not present

## 2020-03-19 DIAGNOSIS — R350 Frequency of micturition: Secondary | ICD-10-CM | POA: Diagnosis not present

## 2020-03-19 DIAGNOSIS — R339 Retention of urine, unspecified: Secondary | ICD-10-CM | POA: Diagnosis not present

## 2020-03-21 ENCOUNTER — Ambulatory Visit (INDEPENDENT_AMBULATORY_CARE_PROVIDER_SITE_OTHER): Payer: Medicare Other | Admitting: Family Medicine

## 2020-03-21 ENCOUNTER — Encounter: Payer: Self-pay | Admitting: Family Medicine

## 2020-03-21 ENCOUNTER — Other Ambulatory Visit: Payer: Self-pay

## 2020-03-21 VITALS — BP 128/78 | HR 82 | Temp 100.2°F | Resp 14 | Wt 199.0 lb

## 2020-03-21 DIAGNOSIS — Z20822 Contact with and (suspected) exposure to covid-19: Secondary | ICD-10-CM

## 2020-03-21 DIAGNOSIS — R300 Dysuria: Secondary | ICD-10-CM | POA: Diagnosis not present

## 2020-03-21 DIAGNOSIS — R509 Fever, unspecified: Secondary | ICD-10-CM

## 2020-03-21 DIAGNOSIS — N390 Urinary tract infection, site not specified: Secondary | ICD-10-CM | POA: Diagnosis not present

## 2020-03-21 DIAGNOSIS — R319 Hematuria, unspecified: Secondary | ICD-10-CM

## 2020-03-21 LAB — URINALYSIS, ROUTINE W REFLEX MICROSCOPIC
Bilirubin Urine: NEGATIVE
Glucose, UA: NEGATIVE
Hyaline Cast: NONE SEEN /LPF
Ketones, ur: NEGATIVE
Nitrite: NEGATIVE
Protein, ur: NEGATIVE
Specific Gravity, Urine: 1.01 (ref 1.001–1.03)
Squamous Epithelial / HPF: NONE SEEN /HPF (ref <=5)
pH: 7 (ref 5.0–8.0)

## 2020-03-21 LAB — COMPREHENSIVE METABOLIC PANEL
AG Ratio: 1.6 (calc) (ref 1.0–2.5)
ALT: 19 U/L (ref 9–46)
AST: 18 U/L (ref 10–35)
Albumin: 4.1 g/dL (ref 3.6–5.1)
Alkaline phosphatase (APISO): 70 U/L (ref 35–144)
BUN: 11 mg/dL (ref 7–25)
CO2: 25 mmol/L (ref 20–32)
Calcium: 8.9 mg/dL (ref 8.6–10.3)
Chloride: 101 mmol/L (ref 98–110)
Creat: 0.9 mg/dL (ref 0.70–1.18)
Globulin: 2.6 g/dL (calc) (ref 1.9–3.7)
Glucose, Bld: 115 mg/dL — ABNORMAL HIGH (ref 65–99)
Potassium: 4.3 mmol/L (ref 3.5–5.3)
Sodium: 136 mmol/L (ref 135–146)
Total Bilirubin: 4 mg/dL — ABNORMAL HIGH (ref 0.2–1.2)
Total Protein: 6.7 g/dL (ref 6.1–8.1)

## 2020-03-21 LAB — CBC WITH DIFFERENTIAL/PLATELET
Absolute Monocytes: 853 cells/uL (ref 200–950)
Basophils Absolute: 115 cells/uL (ref 0–200)
Basophils Relative: 0.7 %
Eosinophils Absolute: 16 cells/uL (ref 15–500)
Eosinophils Relative: 0.1 %
HCT: 45.4 % (ref 38.5–50.0)
Hemoglobin: 15.8 g/dL (ref 13.2–17.1)
Lymphs Abs: 656 cells/uL — ABNORMAL LOW (ref 850–3900)
MCH: 31.5 pg (ref 27.0–33.0)
MCHC: 34.8 g/dL (ref 32.0–36.0)
MCV: 90.6 fL (ref 80.0–100.0)
MPV: 11.4 fL (ref 7.5–12.5)
Monocytes Relative: 5.2 %
Neutro Abs: 14760 cells/uL — ABNORMAL HIGH (ref 1500–7800)
Neutrophils Relative %: 90 %
Platelets: 198 10*3/uL (ref 140–400)
RBC: 5.01 10*6/uL (ref 4.20–5.80)
RDW: 12.3 % (ref 11.0–15.0)
Total Lymphocyte: 4 %
WBC: 16.4 10*3/uL — ABNORMAL HIGH (ref 3.8–10.8)

## 2020-03-21 LAB — MICROSCOPIC MESSAGE

## 2020-03-21 MED ORDER — CIPROFLOXACIN HCL 500 MG PO TABS: 500.0000 mg | ORAL_TABLET | Freq: Two times a day (BID) | ORAL | 0 refills | Status: DC

## 2020-03-21 MED ORDER — PHENAZOPYRIDINE HCL 100 MG PO TABS: 100.0000 mg | ORAL_TABLET | Freq: Three times a day (TID) | ORAL | 0 refills | Status: DC | PRN

## 2020-03-21 NOTE — Progress Notes (Signed)
   Subjective:    Patient ID: Theodore Davis, male    DOB: [birth year], 74 y.o.   MRN: [id number]  Patient presents for Illness (x1 day- body aches, HA (frontal) runny nose, SOB, fever/ chills) and Urinary Retention (had procedue done a urology Monday and it is extremely painful to urinate)  Pt here with body aches, fever, urinary frequency and urgency for the past 2 days. He had what sounds like a cystocopy of some sort for ongoing bladder retention issues, she was changed to Rapaflo as well, prevously on finesteride. He was not prescribed any recent antibiotics. He denies any N/V, SOB, but does have runny nose and occ cough. He has been vaccinated. States he just feels bad Appetite is decreased States he is going to the bathroom every 15-20 minutes at times, and he able to get urine out No change in bowels  He took tylenol for fever this AM  Wife is not sick    Review Of Systems:  GEN- d+ fatigue,+ fever, weight loss,weakness, recent illness HEENT- denies eye drainage, change in vision, nasal discharge, CVS- denies chest pain, palpitations RESP- denies SOB, cough, wheeze ABD- denies N/V, change in stools, abd pain GU- +dysuria, hematuria, dribbling, incontinence MSK- denies joint pain, muscle aches, injury Neuro- denies headache, dizziness, syncope, seizure activity       Objective:    BP 128/78   Pulse 82   Temp 100.2 F (37.9 C) (Temporal)   Resp 14   Wt 199 lb (90.3 kg)   SpO2 99%   BMI 28.97 kg/m  GEN- NAD, alert and oriented x3 HEENT- PERRL, EOMI, non injected sclera, pink conjunctiva, MMM, oropharynx clear, nares +rhinorrhea no maxillary sinus tenderness  Neck- Supple, no LAD CVS- RRR, no murmur RESP-CTAB ABD-NABS,soft,TTP suprapubic region, no rebound, no CVA tenderness EXT- No edema Pulses- Radial  2+        Assessment & Plan:      Problem List Items Addressed This Visit    None    Visit Diagnoses    Encounter for laboratory testing for COVID-19  virus    -  Primary   Discussed red flags with pt, unable to tolerate fluids unable to urinate,, SOB go toER. Covid swab sent due to mild uri symptoms accompanying    Relevant Orders   SARS-COV-2 RNA,(COVID-19) QUAL NAAT   Urinary tract infection with hematuria, site unspecified       Concern for post procedural UTI, start Cipro, culture sent, blood in urine likely from infection and recent trauma, STAT labs showed WBC of 16,000.    Relevant Medications   phenazopyridine (PYRIDIUM) 100 MG tablet   Other Relevant Orders   Urinalysis, Routine w reflex microscopic (Completed)   Urine Culture   Fever, unspecified fever cause       Relevant Orders   Comprehensive metabolic panel (Completed)   CBC with Differential/Platelet (Completed)      Note: This dictation was prepared with Dragon dictation along with smaller phrase technology. Any transcriptional errors that result from this process are unintentional.

## 2020-03-21 NOTE — Patient Instructions (Signed)
Take the antibiotics The pyridium is for pain Continue fever reducer We will call with lab results  F/U pending results

## 2020-03-22 ENCOUNTER — Encounter: Payer: Self-pay | Admitting: Family Medicine

## 2020-03-22 LAB — SARS-COV-2 RNA,(COVID-19) QUALITATIVE NAAT: SARS CoV2 RNA: NOT DETECTED

## 2020-03-23 DIAGNOSIS — N3000 Acute cystitis without hematuria: Secondary | ICD-10-CM | POA: Diagnosis not present

## 2020-03-23 LAB — URINE CULTURE
MICRO NUMBER:: 11010329
SPECIMEN QUALITY:: ADEQUATE

## 2020-03-26 ENCOUNTER — Telehealth: Payer: Self-pay | Admitting: Family Medicine

## 2020-03-29 ENCOUNTER — Ambulatory Visit (INDEPENDENT_AMBULATORY_CARE_PROVIDER_SITE_OTHER): Payer: Medicare Other | Admitting: Family Medicine

## 2020-03-29 ENCOUNTER — Encounter: Payer: Self-pay | Admitting: Family Medicine

## 2020-03-29 ENCOUNTER — Other Ambulatory Visit: Payer: Self-pay | Admitting: Family Medicine

## 2020-03-29 ENCOUNTER — Other Ambulatory Visit: Payer: Self-pay

## 2020-03-29 VITALS — BP 136/72 | HR 62 | Temp 98.1°F | Resp 14 | Ht 70.0 in | Wt 181.0 lb

## 2020-03-29 DIAGNOSIS — N390 Urinary tract infection, site not specified: Secondary | ICD-10-CM

## 2020-03-29 LAB — URINALYSIS, ROUTINE W REFLEX MICROSCOPIC
Bacteria, UA: NONE SEEN /HPF
Bilirubin Urine: NEGATIVE
Glucose, UA: NEGATIVE
Hgb urine dipstick: NEGATIVE
Hyaline Cast: NONE SEEN /LPF
Ketones, ur: NEGATIVE
Nitrite: NEGATIVE
Protein, ur: NEGATIVE
RBC / HPF: NONE SEEN /HPF (ref 0–2)
Specific Gravity, Urine: 1.01 (ref 1.001–1.03)
Squamous Epithelial / HPF: NONE SEEN /HPF (ref <=5)
pH: 5.5 (ref 5.0–8.0)

## 2020-03-29 LAB — MICROSCOPIC MESSAGE

## 2020-03-29 MED ORDER — CIPROFLOXACIN HCL 500 MG PO TABS
500.0000 mg | ORAL_TABLET | Freq: Two times a day (BID) | ORAL | 0 refills | Status: DC
Start: 2020-03-29 — End: 2020-05-29

## 2020-03-29 MED ORDER — CIPROFLOXACIN HCL 500 MG PO TABS: 500.0000 mg | ORAL_TABLET | Freq: Two times a day (BID) | ORAL | 0 refills | Status: DC

## 2020-03-29 NOTE — Addendum Note (Signed)
Addended by: Sheral Flow on: 03/29/2020 03:30 PM   Modules accepted: Orders

## 2020-03-29 NOTE — Progress Notes (Signed)
Subjective:    Patient ID: Theodore Davis, male    DOB: [birth year], 74 y.o.   MRN: [id number]  Patient recently developed a urinary tract infection after undergoing cystoscopy.  He saw my partner.  At that time urinalysis was abnormal and urine culture revealed Klebsiella.  White count was significantly elevated.  He was started on 1 week of Cipro and instructed to follow-up with me.  He has been off the antibiotics for about 1 week now per his report.  Urinalysis still shows trace leukocyte esterase.  Culture is pending.  He does report some mild low back pain and some mild urinary hesitancy.  Sounds like the patient may have some residual prostatitis Past Medical History:  Diagnosis Date  . Arthritis 10/03/2010   right sternoclavicular joint  . Heart murmur   . Hemangioma of spine    lower lumbar  . HOH (hard of hearing)    R ear  . Hyperlipidemia    hypercholesterolemia  . Lyme disease    Past Surgical History:  Procedure Laterality Date  . CARDIOVASCULAR STRESS TEST  08/2010  . CHOLECYSTECTOMY    . HERNIA REPAIR     R inguinal  . JOINT REPLACEMENT Right   . MASTOID DEBRIDEMENT     R ear  . RESECTION DISTAL CLAVICAL Left 11/30/2014   Procedure: RESECTION DISTAL CLAVICAL;  Surgeon: Kathryne Hitch, MD;  Location: [place];  Service: Orthopedics;  Laterality: Left;  . SHOULDER ARTHROSCOPY WITH BICEPSTENOTOMY Left 11/30/2014   Procedure: SHOULDER ARTHROSCOPY WITH BICEPSTENOTOMY;  Surgeon: Kathryne Hitch, MD;  Location: [place];  Service: Orthopedics;  Laterality: Left;  . SHOULDER ARTHROSCOPY WITH ROTATOR CUFF REPAIR AND SUBACROMIAL DECOMPRESSION Left 11/30/2014   Procedure: LEFT SHOULDER ARTHROSCOPY WITH DISTAL CLAVICAL EXCISION, ACROMIOPLASTY, ROTATOR CUFF REPAIR , BICEPS TENOTOMY;  Surgeon: Kathryne Hitch, MD;  Location: [place];  Service: Orthopedics;  Laterality: Left;  . TENDON REPAIR  2005   R elbow  . TOTAL KNEE ARTHROPLASTY   05/28/2011   Procedure: TOTAL KNEE ARTHROPLASTY;  Surgeon: Ninetta Lights, MD;  Location: [place];  Service: Orthopedics;  Laterality: Right;  Right total knee arthroplasty   . US ECHOCARDIOGRAPHY  08/2010   Current Outpatient Medications on File Prior to Visit  Medication Sig Dispense Refill  . magnesium oxide (MAG-OX) 400 MG tablet Take 400 mg by mouth daily.     Marland Kitchen RAPAFLO 8 MG CAPS capsule Take 1 capsule (8 mg total) by mouth daily with breakfast. 90 capsule 3  . simvastatin (ZOCOR) 40 MG tablet TAKE 1 TABLET BY MOUTH EVERYDAY AT BEDTIME. PLEASE SCHEDULE ANNUAL APPT WITH DR. Claiborne Billings FOR REFILLS 30 tablet 6   No current facility-administered medications on file prior to visit.   Allergies  Allergen Reactions  . Codeine Nausea And Vomiting  . Lipitor [Atorvastatin Calcium] Other (See Comments)    myalgias   Social History   Socioeconomic History  . Marital status: Married    Spouse name: Not on file  . Number of children: Not on file  . Years of education: Not on file  . Highest education level: Not on file  Occupational History  . Not on file  Tobacco Use  . Smoking status: Former Smoker    Packs/day: 0.25    Years: 50.00    Pack years: 12.50    Quit date: 06/23/1997    Years since quitting: 22.7  . Smokeless tobacco: Former Network engineer  . Vaping  Use: Never used  Substance and Sexual Activity  . Alcohol use: Yes    Comment: occasional beer  . Drug use: No  . Sexual activity: Yes  Other Topics Concern  . Not on file  Social History Narrative   [facility] (04/08/17):   Originally from [facility]. Has lived in [place] as well. Previously worked for the Education officer, environmental as a Dealer. Has 2 dogs & a rabbit currently. No bird exposure. He reports black mold was found in his air conditioner at [facility] when it was changed out 2 years ago. No hot tub exposure. Enjoys fishing.    Social Determinants of Health   Financial Resource Strain:   . Difficulty of Paying Living  Expenses: Not on file  Food Insecurity:   . Worried About Charity fundraiser in the Last Year: Not on file  . Ran Out of Food in the Last Year: Not on file  Transportation Needs:   . Lack of Transportation (Medical): Not on file  . Lack of Transportation (Non-Medical): Not on file  Physical Activity:   . Days of Exercise per Week: Not on file  . Minutes of Exercise per Session: Not on file  Stress:   . Feeling of Stress : Not on file  Social Connections:   . Frequency of Communication with Friends and Family: Not on file  . Frequency of Social Gatherings with Friends and Family: Not on file  . Attends Religious Services: Not on file  . Active Member of Clubs or Organizations: Not on file  . Attends Archivist Meetings: Not on file  . Marital Status: Not on file  Intimate Partner Violence:   . Fear of Current or Ex-Partner: Not on file  . Emotionally Abused: Not on file  . Physically Abused: Not on file  . Sexually Abused: Not on file   Family History  Problem Relation Age of Onset  . Heart disease Mother   . Atrial fibrillation Mother   . Heart disease Father   . Rheum arthritis Father   . Bladder Cancer Father   . Atrial fibrillation Father   . Brain cancer Maternal Uncle   . Lung disease Neg Hx      Review of Systems  All other systems reviewed and are negative.      Objective:   Physical Exam Vitals and nursing note reviewed.  Constitutional:      General: He is not in acute distress.    Appearance: He is well-developed. He is not diaphoretic.  Cardiovascular:     Rate and Rhythm: Normal rate and regular rhythm.     Heart sounds: Normal heart sounds.  Pulmonary:     Effort: Pulmonary effort is normal. No respiratory distress.     Breath sounds: Normal breath sounds. No wheezing.         Assessment & Plan:  Urinary tract infection without hematuria, site unspecified  I believe the patient still has urinary tract infection possibly  prostatitis.  I recommended Cipro 500 mg p.o. twice daily for an additional 7 days.  Await the results of his urine culture.

## 2020-03-30 LAB — URINE CULTURE
MICRO NUMBER:: 11044496
Result:: NO GROWTH
SPECIMEN QUALITY:: ADEQUATE

## 2020-04-03 ENCOUNTER — Other Ambulatory Visit: Payer: Self-pay | Admitting: Physician Assistant

## 2020-04-17 DIAGNOSIS — R350 Frequency of micturition: Secondary | ICD-10-CM | POA: Diagnosis not present

## 2020-04-17 DIAGNOSIS — R339 Retention of urine, unspecified: Secondary | ICD-10-CM | POA: Diagnosis not present

## 2020-04-19 ENCOUNTER — Other Ambulatory Visit (HOSPITAL_COMMUNITY): Payer: Self-pay | Admitting: Adult Health

## 2020-04-19 DIAGNOSIS — R8271 Bacteriuria: Secondary | ICD-10-CM | POA: Diagnosis not present

## 2020-04-19 DIAGNOSIS — N3000 Acute cystitis without hematuria: Secondary | ICD-10-CM | POA: Diagnosis not present

## 2020-04-22 ENCOUNTER — Other Ambulatory Visit: Payer: Self-pay | Admitting: Physician Assistant

## 2020-04-25 ENCOUNTER — Ambulatory Visit: Payer: Medicare Other | Attending: Internal Medicine

## 2020-04-25 ENCOUNTER — Other Ambulatory Visit (HOSPITAL_COMMUNITY): Payer: Self-pay | Admitting: Internal Medicine

## 2020-04-25 DIAGNOSIS — Z23 Encounter for immunization: Secondary | ICD-10-CM

## 2020-04-25 NOTE — Progress Notes (Signed)
   Covid-19 Vaccination Clinic  Name:  Theodore Davis    MRN: [id number] DOB: 05/25/1946  04/25/2020  Mr. [name] was observed post Covid-19 immunization for 15 minutes without incident. He was provided with Vaccine Information Sheet and instruction to access the V-Safe system.   Mr. [name] was instructed to call 911 with any severe reactions post vaccine: Marland Kitchen Difficulty breathing  . Swelling of face and throat  . A fast heartbeat  . A bad rash all over body  . Dizziness and weakness

## 2020-05-01 ENCOUNTER — Other Ambulatory Visit (HOSPITAL_COMMUNITY): Payer: Self-pay | Admitting: Urology

## 2020-05-01 DIAGNOSIS — N3000 Acute cystitis without hematuria: Secondary | ICD-10-CM | POA: Diagnosis not present

## 2020-05-01 DIAGNOSIS — R350 Frequency of micturition: Secondary | ICD-10-CM | POA: Diagnosis not present

## 2020-05-01 DIAGNOSIS — R3914 Feeling of incomplete bladder emptying: Secondary | ICD-10-CM | POA: Diagnosis not present

## 2020-05-07 DIAGNOSIS — U071 COVID-19: Secondary | ICD-10-CM | POA: Diagnosis not present

## 2020-05-07 DIAGNOSIS — Z20822 Contact with and (suspected) exposure to covid-19: Secondary | ICD-10-CM | POA: Diagnosis not present

## 2020-05-21 ENCOUNTER — Telehealth: Payer: Self-pay | Admitting: Family Medicine

## 2020-05-21 NOTE — Telephone Encounter (Signed)
Patient called and states he got the booster 11/4-11/5 and then 10 days after that he got COVID- he states he is better just no energy and still coughing. His taste and smell is still gone, he would like to see about getting the infusion.  CB# [phone]

## 2020-05-21 NOTE — Telephone Encounter (Signed)
Let Pt vm call back for information regarding him receiving an infusion

## 2020-05-29 ENCOUNTER — Other Ambulatory Visit: Payer: Self-pay | Admitting: Family Medicine

## 2020-05-29 ENCOUNTER — Telehealth: Payer: Self-pay | Admitting: *Deleted

## 2020-05-29 MED ORDER — HYDROCODONE-HOMATROPINE 5-1.5 MG/5ML PO SYRP
5.0000 mL | ORAL_SOLUTION | Freq: Three times a day (TID) | ORAL | 0 refills | Status: DC | PRN
Start: 2020-05-29 — End: 2020-07-24

## 2020-05-29 MED ORDER — HYDROCODONE-HOMATROPINE 5-1.5 MG/5ML PO SYRP: 5.0000 mL | ORAL_SOLUTION | Freq: Three times a day (TID) | ORAL | 0 refills | Status: DC | PRN

## 2020-05-29 NOTE — Telephone Encounter (Signed)
Call placed to patient and patient made aware.   Requested Hycodan to be sent to [facility] pharmacy.

## 2020-05-29 NOTE — Telephone Encounter (Signed)
Received call from patient.   Reports that he tested COVID + on 05/05/2020. States that he did not receive MAB Infusion.   Reports that he continues to have nonproductive cough with fever/chills that worsens at night. Also reports slight SOB during coughing fit, but SOB is not noted at any other time.   Unable to verify fever as he has not been checking temperature. States that he has been taking APAP for fever, but has not been taking any other medications for cough.   Advised that cough can linger after COVID. Advised to use OTC Mucinex or Delsym for cough.   Of note, patient is currently in [place], [place] and cannot come in for appointment today. Appointment scheduled for 05/30/2020 with fellow MD.

## 2020-05-29 NOTE — Telephone Encounter (Signed)
Definitely should be seen.  Will call in hycodan as well.

## 2020-05-30 ENCOUNTER — Ambulatory Visit (INDEPENDENT_AMBULATORY_CARE_PROVIDER_SITE_OTHER): Payer: Medicare Other | Admitting: Family Medicine

## 2020-05-30 ENCOUNTER — Encounter: Payer: Self-pay | Admitting: Family Medicine

## 2020-05-30 ENCOUNTER — Other Ambulatory Visit: Payer: Self-pay

## 2020-05-30 VITALS — BP 138/64 | HR 80 | Temp 98.7°F | Ht 69.5 in | Wt 190.2 lb

## 2020-05-30 DIAGNOSIS — R300 Dysuria: Secondary | ICD-10-CM | POA: Diagnosis not present

## 2020-05-30 DIAGNOSIS — Z8616 Personal history of COVID-19: Secondary | ICD-10-CM | POA: Diagnosis not present

## 2020-05-30 DIAGNOSIS — R319 Hematuria, unspecified: Secondary | ICD-10-CM | POA: Diagnosis not present

## 2020-05-30 DIAGNOSIS — Z87440 Personal history of urinary (tract) infections: Secondary | ICD-10-CM | POA: Diagnosis not present

## 2020-05-30 DIAGNOSIS — N390 Urinary tract infection, site not specified: Secondary | ICD-10-CM | POA: Diagnosis not present

## 2020-05-30 LAB — URINALYSIS, ROUTINE W REFLEX MICROSCOPIC
Bilirubin Urine: NEGATIVE
Glucose, UA: NEGATIVE
Hyaline Cast: NONE SEEN /LPF
Ketones, ur: NEGATIVE
Nitrite: POSITIVE — AB
Protein, ur: NEGATIVE
Specific Gravity, Urine: 1.015 (ref 1.001–1.03)
Squamous Epithelial / HPF: NONE SEEN /HPF (ref <=5)
pH: 5.5 (ref 5.0–8.0)

## 2020-05-30 LAB — COMPREHENSIVE METABOLIC PANEL
AG Ratio: 1.1 (calc) (ref 1.0–2.5)
ALT: 20 U/L (ref 9–46)
AST: 18 U/L (ref 10–35)
Albumin: 3.5 g/dL — ABNORMAL LOW (ref 3.6–5.1)
Alkaline phosphatase (APISO): 75 U/L (ref 35–144)
BUN: 13 mg/dL (ref 7–25)
CO2: 30 mmol/L (ref 20–32)
Calcium: 8.6 mg/dL (ref 8.6–10.3)
Chloride: 103 mmol/L (ref 98–110)
Creat: 0.91 mg/dL (ref 0.70–1.18)
Globulin: 3.1 g/dL (calc) (ref 1.9–3.7)
Glucose, Bld: 116 mg/dL — ABNORMAL HIGH (ref 65–99)
Potassium: 4.1 mmol/L (ref 3.5–5.3)
Sodium: 139 mmol/L (ref 135–146)
Total Bilirubin: 0.6 mg/dL (ref 0.2–1.2)
Total Protein: 6.6 g/dL (ref 6.1–8.1)

## 2020-05-30 LAB — CBC WITH DIFFERENTIAL/PLATELET
Absolute Monocytes: 735 cells/uL (ref 200–950)
Basophils Absolute: 47 cells/uL (ref 0–200)
Basophils Relative: 0.6 %
Eosinophils Absolute: 47 cells/uL (ref 15–500)
Eosinophils Relative: 0.6 %
HCT: 37 % — ABNORMAL LOW (ref 38.5–50.0)
Hemoglobin: 12.7 g/dL — ABNORMAL LOW (ref 13.2–17.1)
Lymphs Abs: 1240 cells/uL (ref 850–3900)
MCH: 30.5 pg (ref 27.0–33.0)
MCHC: 34.3 g/dL (ref 32.0–36.0)
MCV: 88.7 fL (ref 80.0–100.0)
MPV: 10.4 fL (ref 7.5–12.5)
Monocytes Relative: 9.3 %
Neutro Abs: 5830 cells/uL (ref 1500–7800)
Neutrophils Relative %: 73.8 %
Platelets: 405 10*3/uL — ABNORMAL HIGH (ref 140–400)
RBC: 4.17 10*6/uL — ABNORMAL LOW (ref 4.20–5.80)
RDW: 12.1 % (ref 11.0–15.0)
Total Lymphocyte: 15.7 %
WBC: 7.9 10*3/uL (ref 3.8–10.8)

## 2020-05-30 LAB — MICROSCOPIC MESSAGE

## 2020-05-30 MED ORDER — CEFTRIAXONE SODIUM 1 G IJ SOLR
1.0000 g | Freq: Once | INTRAMUSCULAR | Status: AC
  Administered 2020-05-30: 1 g via INTRAMUSCULAR

## 2020-05-30 NOTE — Patient Instructions (Addendum)
Pick up the cough medicine Rocephin shot given for UTI We will call with results and culture F/U pending results

## 2020-05-30 NOTE — Progress Notes (Signed)
   Subjective:    Patient ID: Theodore Davis, male    DOB: March 11, 1946, 74 y.o.   MRN: [id number]  Patient presents for Cough (in the day) and have been sick since October (reoccurant bladder infections and wants it checked again today.)   Pt here with continued cough, he had COVID-19 back in November, he did not have MAB done , positive  05/07/20  he was prescribed hycodan by PCP yesterday  He has cough mostly during the day, though today has been okay, no SOB  History of recurrnet UTI  - treated in Oct with Cipro for 7 days, but culture came back negative   he has urologist, history of urinary retention Has been on rapalfo  He gets back pain and frequency and gets concerned about infections  Past few days low back has been hurting but also has a cyst back there and was coughing  No ever, no chills, no abd pain, no pain with urination, urine has been a little dark   He had klebsiella back in Sept   Review Of Systems:  GEN- denies fatigue, fever, weight loss,weakness, recent illness HEENT- denies eye drainage, change in vision, nasal discharge, CVS- denies chest pain, palpitations RESP- denies SOB,+ cough, wheeze ABD- denies N/V, change in stools, abd pain GU-+dysuria, hematuria, dribbling, incontinence MSK-+ joint pain, muscle aches, injury Neuro- denies headache, dizziness, syncope, seizure activity       Objective:    BP 138/64   Pulse 80   Temp 98.7 F (37.1 C) (Temporal)   Ht 5' 9.5" (1.765 m)   Wt 190 lb 3.2 oz (86.3 kg)   SpO2 97%   BMI 27.68 kg/m  GEN- NAD, alert and oriented x3 HEENT- PERRL, EOMI, non injected sclera, pink conjunctiva, MMM, oropharynx clear Neck- Supple, no LAD CVS- RRR, no murmur RESP-CTAB ABD-NABS,soft,NT,ND MSK- no CVA tenderness, soft tissue lesion right flank, mild TTP EXT- No edema Pulses- Radial 2+        Assessment & Plan:      Problem List Items Addressed This Visit    None    Visit Diagnoses    Urinary tract infection  with hematuria, site unspecified    -  Primary   UA concerning for recurrent infection, though no classic symptoms, will give Rocephin injection has been on cipro x 3 in the past 2 months, await culture    Relevant Medications   cefTRIAXone (ROCEPHIN) injection 1 g (Completed)   Other Relevant Orders   Urinalysis, Routine w reflex microscopic (Completed)   Urine Culture   CBC with Differential/Platelet (Completed)   Comprehensive metabolic panel (Completed)   History of COVID-19       continued cough, but respiratory exam improved, oxygen sat is normal   Relevant Orders   CBC with Differential/Platelet (Completed)   Comprehensive metabolic panel (Completed)   History of UTI       Relevant Medications   cefTRIAXone (ROCEPHIN) injection 1 g (Completed)      Note: This dictation was prepared with Dragon dictation along with smaller phrase technology. Any transcriptional errors that result from this process are unintentional.

## 2020-06-01 ENCOUNTER — Other Ambulatory Visit: Payer: Self-pay | Admitting: Family Medicine

## 2020-06-01 LAB — URINE CULTURE
MICRO NUMBER:: 11292389
SPECIMEN QUALITY:: ADEQUATE

## 2020-06-01 MED ORDER — SULFAMETHOXAZOLE-TRIMETHOPRIM 800-160 MG PO TABS: 1.0000 | ORAL_TABLET | Freq: Two times a day (BID) | ORAL | 0 refills | Status: DC

## 2020-06-01 MED ORDER — SULFAMETHOXAZOLE-TRIMETHOPRIM 800-160 MG PO TABS
1.0000 | ORAL_TABLET | Freq: Two times a day (BID) | ORAL | 0 refills | Status: DC
Start: 2020-06-01 — End: 2020-07-24

## 2020-06-01 NOTE — Progress Notes (Signed)
Trying to reach pt by phone to give results to lab. Urine is currently pending. Will call pt once results are in. Sent letter via [employer].

## 2020-06-05 DIAGNOSIS — D692 Other nonthrombocytopenic purpura: Secondary | ICD-10-CM | POA: Diagnosis not present

## 2020-06-05 DIAGNOSIS — L821 Other seborrheic keratosis: Secondary | ICD-10-CM | POA: Diagnosis not present

## 2020-06-05 DIAGNOSIS — D1801 Hemangioma of skin and subcutaneous tissue: Secondary | ICD-10-CM | POA: Diagnosis not present

## 2020-07-09 ENCOUNTER — Ambulatory Visit (INDEPENDENT_AMBULATORY_CARE_PROVIDER_SITE_OTHER): Payer: Medicare Other | Admitting: Otolaryngology

## 2020-07-21 ENCOUNTER — Other Ambulatory Visit: Payer: Self-pay | Admitting: Cardiovascular Disease

## 2020-07-23 ENCOUNTER — Telehealth: Payer: Self-pay | Admitting: Cardiovascular Disease

## 2020-07-23 NOTE — Telephone Encounter (Signed)
°*  STAT* If patient is at the pharmacy, call can be transferred to refill team.   1. Which medications need to be refilled? (please list name of each medication and dose if known)  simvastatin (ZOCOR) 40 MG tablet  2. Which pharmacy/location (including street and city if local pharmacy) is medication to be sent to? [id number] - [address], [place]  3. Do they need a 30 day or 90 day supply? 90  Pt is out of medication

## 2020-07-24 ENCOUNTER — Other Ambulatory Visit: Payer: Self-pay

## 2020-07-24 ENCOUNTER — Encounter: Payer: Self-pay | Admitting: Family Medicine

## 2020-07-24 ENCOUNTER — Ambulatory Visit (INDEPENDENT_AMBULATORY_CARE_PROVIDER_SITE_OTHER): Payer: Medicare Other | Admitting: Family Medicine

## 2020-07-24 VITALS — BP 140/60 | HR 58 | Temp 98.6°F | Ht 69.0 in | Wt 192.0 lb

## 2020-07-24 DIAGNOSIS — U071 COVID-19: Secondary | ICD-10-CM

## 2020-07-24 NOTE — Progress Notes (Signed)
Subjective:    Patient ID: Theodore Davis, male    DOB: [birth year], 75 y.o.   MRN: [id number]  10/21 Patient recently developed a urinary tract infection after undergoing cystoscopy.  He saw my partner.  At that time urinalysis was abnormal and urine culture revealed Klebsiella.  White count was significantly elevated.  He was started on 1 week of Cipro and instructed to follow-up with me.  He has been off the antibiotics for about 1 week now per his report.  Urinalysis still shows trace leukocyte esterase.  Culture is pending.  He does report some mild low back pain and some mild urinary hesitancy.  Sounds like the patient may have some residual prostatitis.  At that time, my plan was: I believe the patient still has urinary tract infection possibly prostatitis.  I recommended Cipro 500 mg p.o. twice daily for an additional 7 days.  Await the results of his urine culture.  07/24/20 Patient had [place] in December.  For the last 6 weeks, he reports wheezing and chest congestion.  On exam today he has expiratory wheezing in all 4 lung fields that is mild.  He also has rhonchorous breath sounds.  He states that he has been seen by the [facility] pulmonology and was diagnosed with mild COPD that is typically asymptomatic.  He started smoking when he was 16 and smoked into his 88s however he never was a pack-a-day smoker and he has quit since 2005. Past Medical History:  Diagnosis Date  . Arthritis 10/03/2010   right sternoclavicular joint  . Heart murmur   . Hemangioma of spine    lower lumbar  . HOH (hard of hearing)    R ear  . Hyperlipidemia    hypercholesterolemia  . Lyme disease    Past Surgical History:  Procedure Laterality Date  . CARDIOVASCULAR STRESS TEST  08/2010  . CHOLECYSTECTOMY    . HERNIA REPAIR     R inguinal  . JOINT REPLACEMENT Right   . MASTOID DEBRIDEMENT     R ear  . RESECTION DISTAL CLAVICAL Left 11/30/2014   Procedure: RESECTION DISTAL CLAVICAL;  Surgeon: Kathryne Hitch,  MD;  Location: [place];  Service: Orthopedics;  Laterality: Left;  . SHOULDER ARTHROSCOPY WITH BICEPSTENOTOMY Left 11/30/2014   Procedure: SHOULDER ARTHROSCOPY WITH BICEPSTENOTOMY;  Surgeon: Kathryne Hitch, MD;  Location: [place];  Service: Orthopedics;  Laterality: Left;  . SHOULDER ARTHROSCOPY WITH ROTATOR CUFF REPAIR AND SUBACROMIAL DECOMPRESSION Left 11/30/2014   Procedure: LEFT SHOULDER ARTHROSCOPY WITH DISTAL CLAVICAL EXCISION, ACROMIOPLASTY, ROTATOR CUFF REPAIR , BICEPS TENOTOMY;  Surgeon: Kathryne Hitch, MD;  Location: [place];  Service: Orthopedics;  Laterality: Left;  . TENDON REPAIR  2005   R elbow  . TOTAL KNEE ARTHROPLASTY  05/28/2011   Procedure: TOTAL KNEE ARTHROPLASTY;  Surgeon: Ninetta Lights, MD;  Location: [place];  Service: Orthopedics;  Laterality: Right;  Right total knee arthroplasty   . US ECHOCARDIOGRAPHY  08/2010   Current Outpatient Medications on File Prior to Visit  Medication Sig Dispense Refill  . HYDROcodone-homatropine (HYCODAN) 5-1.5 MG/5ML syrup Take 5 mLs by mouth every 8 (eight) hours as needed for cough. (Patient not taking: Reported on 05/30/2020) 120 mL 0  . magnesium oxide (MAG-OX) 400 MG tablet Take 400 mg by mouth daily.     Marland Kitchen RAPAFLO 8 MG CAPS capsule Take 1 capsule (8 mg total) by mouth daily with breakfast. 90 capsule 3  . simvastatin (ZOCOR) 40  MG tablet TAKE 1 TABLET (40 MG TOTAL) BY MOUTH AT BEDTIME. NEED OV. 30 tablet 0  . sulfamethoxazole-trimethoprim (BACTRIM DS) 800-160 MG tablet Take 1 tablet by mouth 2 (two) times daily. 42 tablet 0   No current facility-administered medications on file prior to visit.   Allergies  Allergen Reactions  . Codeine Nausea And Vomiting  . Lipitor [Atorvastatin Calcium] Other (See Comments)    myalgias   Social History   Socioeconomic History  . Marital status: Married    Spouse name: Not on file  . Number of children: Not on file  . Years of education: Not  on file  . Highest education level: Not on file  Occupational History  . Not on file  Tobacco Use  . Smoking status: Former Smoker    Packs/day: 0.25    Years: 50.00    Pack years: 12.50    Quit date: 06/23/1997    Years since quitting: 23.1  . Smokeless tobacco: Former Network engineer  . Vaping Use: Never used  Substance and Sexual Activity  . Alcohol use: Yes    Comment: occasional beer  . Drug use: No  . Sexual activity: Yes  Other Topics Concern  . Not on file  Social History Narrative   [facility] (04/08/17):   Originally from [facility]. Has lived in [place] as well. Previously worked for the Education officer, environmental as a Dealer. Has 2 dogs & a rabbit currently. No bird exposure. He reports black mold was found in his air conditioner at [facility] when it was changed out 2 years ago. No hot tub exposure. Enjoys fishing.    Social Determinants of Health   Financial Resource Strain: Not on file  Food Insecurity: Not on file  Transportation Needs: Not on file  Physical Activity: Not on file  Stress: Not on file  Social Connections: Not on file  Intimate Partner Violence: Not on file   Family History  Problem Relation Age of Onset  . Heart disease Mother   . Atrial fibrillation Mother   . Heart disease Father   . Rheum arthritis Father   . Bladder Cancer Father   . Atrial fibrillation Father   . Brain cancer Maternal Uncle   . Lung disease Neg Hx      Review of Systems  All other systems reviewed and are negative.      Objective:   Physical Exam Vitals and nursing note reviewed.  Constitutional:      General: He is not in acute distress.    Appearance: He is well-developed. He is not diaphoretic.  Cardiovascular:     Rate and Rhythm: Normal rate and regular rhythm.     Heart sounds: Normal heart sounds.  Pulmonary:     Effort: Pulmonary effort is normal. No respiratory distress.     Breath sounds: Wheezing and rhonchi present.         Assessment & Plan:   COVID  I believe the patient's recent Covid has triggered an exacerbation of reactive airway disease or even COPD.  I do not feel that this rises to the level to require oral steroids at the present time but I will try the patient on a sample of Breo.  He will try 1 inhalation a day for the next 2 weeks to see if this helps with some of the audible wheezing and chest congestion.  If this is not helping, I would recommend a chest x-ray and a trial of oral prednisone.  Patient is comfortable with this plan and will keep me updated

## 2020-07-25 ENCOUNTER — Ambulatory Visit (INDEPENDENT_AMBULATORY_CARE_PROVIDER_SITE_OTHER): Payer: Medicare Other | Admitting: Otolaryngology

## 2020-08-14 ENCOUNTER — Other Ambulatory Visit: Payer: Self-pay | Admitting: Family Medicine

## 2020-08-14 ENCOUNTER — Telehealth: Payer: Self-pay | Admitting: Family Medicine

## 2020-08-14 ENCOUNTER — Other Ambulatory Visit: Payer: Self-pay | Admitting: Cardiovascular Disease

## 2020-08-14 MED ORDER — GUAIFENESIN-CODEINE 100-10 MG/5ML PO SOLN
10.0000 mL | Freq: Three times a day (TID) | ORAL | 0 refills | Status: DC | PRN
Start: 2020-08-14 — End: 2020-09-13

## 2020-08-14 NOTE — Telephone Encounter (Signed)
Pt states that he has the symptoms his wife has which of Dr.Pickard would like to see if Dr,Pickard prescribe cough medication

## 2020-08-14 NOTE — Telephone Encounter (Signed)
Call placed to patient.   States that he has slight cough, nasal congestion, sore throat and hoarseness.   Advised to continue Sx management with OTC medications. Advised if chest pain, SOB, high fever unresponsive to antipyretics noted, go to ER for evaluation.   PCP made aware and prescription sent to pharmacy for Robitussin [facility].   Of note, patient has intolerance to codeine (N/V). Advised to take medication with food.

## 2020-08-15 NOTE — Telephone Encounter (Signed)
error 

## 2020-08-21 ENCOUNTER — Encounter: Payer: Self-pay | Admitting: Family Medicine

## 2020-08-21 ENCOUNTER — Other Ambulatory Visit: Payer: Self-pay

## 2020-08-21 ENCOUNTER — Other Ambulatory Visit: Payer: Self-pay | Admitting: Family Medicine

## 2020-08-21 ENCOUNTER — Ambulatory Visit (INDEPENDENT_AMBULATORY_CARE_PROVIDER_SITE_OTHER): Payer: Medicare Other | Admitting: Family Medicine

## 2020-08-21 VITALS — BP 126/78 | HR 65 | Temp 97.3°F | Resp 18 | Ht 69.0 in | Wt 196.0 lb

## 2020-08-21 DIAGNOSIS — R399 Unspecified symptoms and signs involving the genitourinary system: Secondary | ICD-10-CM

## 2020-08-21 LAB — URINALYSIS, ROUTINE W REFLEX MICROSCOPIC
Bacteria, UA: NONE SEEN /HPF
Bilirubin Urine: NEGATIVE
Glucose, UA: NEGATIVE
Hyaline Cast: NONE SEEN /LPF
Ketones, ur: NEGATIVE
Leukocytes,Ua: NEGATIVE
Nitrite: NEGATIVE
Protein, ur: NEGATIVE
Specific Gravity, Urine: 1.015 (ref 1.001–1.03)
Squamous Epithelial / HPF: NONE SEEN /HPF (ref <=5)
WBC, UA: NONE SEEN /HPF (ref 0–5)
pH: 6 (ref 5.0–8.0)

## 2020-08-21 LAB — MICROSCOPIC MESSAGE

## 2020-08-21 MED ORDER — PROMETHAZINE HCL 25 MG PO TABS: 25.0000 mg | ORAL_TABLET | Freq: Three times a day (TID) | ORAL | 0 refills | Status: DC | PRN

## 2020-08-21 MED ORDER — SULFAMETHOXAZOLE-TRIMETHOPRIM 800-160 MG PO TABS: 1.0000 | ORAL_TABLET | Freq: Two times a day (BID) | ORAL | 0 refills | Status: DC

## 2020-08-21 NOTE — Progress Notes (Signed)
Subjective:    Patient ID: Theodore Davis, male    DOB: March 18, 1946, 75 y.o.   MRN: [id number]  Patient has recently been suffering from recurrent urinary tract infections and possible prostatitis.  Please see my office visit from December.  He was doing well after his last visit and has not had any problems in a few months.  However over the last week he has developed increased urinary frequency, dysuria, urgency, hesitancy.  He feels like he is not emptying his bladder completely.  This is similar to how his other infections began.  He also has noticed a change in his stool caliber.  He states that his stools are becoming flatter.  I performed a rectal exam today.  There is mild tenderness to palpation around the prostate however is not swollen or enlarged and there is no nodularity.  Therefore there is no evidence of BPH.  Furthermore his PSA last year was 0.3.  He is already on an alpha-blocker, Rapaflo 8 mg a day.  Unfortunately I do not feel that finasteride would provide much assistance to the patient as he does not have BPH contributing to his lower urinary tract symptoms. Past Medical History:  Diagnosis Date  . Arthritis 10/03/2010   right sternoclavicular joint  . Heart murmur   . Hemangioma of spine    lower lumbar  . HOH (hard of hearing)    R ear  . Hyperlipidemia    hypercholesterolemia  . Lyme disease    Past Surgical History:  Procedure Laterality Date  . CARDIOVASCULAR STRESS TEST  08/2010  . CHOLECYSTECTOMY    . HERNIA REPAIR     R inguinal  . JOINT REPLACEMENT Right   . MASTOID DEBRIDEMENT     R ear  . RESECTION DISTAL CLAVICAL Left 11/30/2014   Procedure: RESECTION DISTAL CLAVICAL;  Surgeon: Kathryne Hitch, MD;  Location: [place];  Service: Orthopedics;  Laterality: Left;  . SHOULDER ARTHROSCOPY WITH BICEPSTENOTOMY Left 11/30/2014   Procedure: SHOULDER ARTHROSCOPY WITH BICEPSTENOTOMY;  Surgeon: Kathryne Hitch, MD;  Location: [place];   Service: Orthopedics;  Laterality: Left;  . SHOULDER ARTHROSCOPY WITH ROTATOR CUFF REPAIR AND SUBACROMIAL DECOMPRESSION Left 11/30/2014   Procedure: LEFT SHOULDER ARTHROSCOPY WITH DISTAL CLAVICAL EXCISION, ACROMIOPLASTY, ROTATOR CUFF REPAIR , BICEPS TENOTOMY;  Surgeon: Kathryne Hitch, MD;  Location: [place];  Service: Orthopedics;  Laterality: Left;  . TENDON REPAIR  2005   R elbow  . TOTAL KNEE ARTHROPLASTY  05/28/2011   Procedure: TOTAL KNEE ARTHROPLASTY;  Surgeon: Ninetta Lights, MD;  Location: [place];  Service: Orthopedics;  Laterality: Right;  Right total knee arthroplasty   . US ECHOCARDIOGRAPHY  08/2010   Current Outpatient Medications on File Prior to Visit  Medication Sig Dispense Refill  . guaiFENesin-codeine 100-10 MG/5ML syrup Take 10 mLs by mouth 3 (three) times daily as needed for cough. 120 mL 0  . magnesium oxide (MAG-OX) 400 MG tablet Take 400 mg by mouth daily.     Marland Kitchen RAPAFLO 8 MG CAPS capsule Take 1 capsule (8 mg total) by mouth daily with breakfast. 90 capsule 3  . simvastatin (ZOCOR) 40 MG tablet TAKE 1 TABLET (40 MG TOTAL) BY MOUTH AT BEDTIME. NEED OV. 30 tablet 1   No current facility-administered medications on file prior to visit.   Allergies  Allergen Reactions  . Codeine Nausea And Vomiting  . Lipitor [Atorvastatin Calcium] Other (See Comments)    myalgias   Social History  Socioeconomic History  . Marital status: Married    Spouse name: Not on file  . Number of children: Not on file  . Years of education: Not on file  . Highest education level: Not on file  Occupational History  . Not on file  Tobacco Use  . Smoking status: Former Smoker    Packs/day: 0.25    Years: 50.00    Pack years: 12.50    Quit date: 06/23/1997    Years since quitting: 23.1  . Smokeless tobacco: Former Network engineer  . Vaping Use: Never used  Substance and Sexual Activity  . Alcohol use: Yes    Comment: occasional beer  . Drug use: No  . Sexual activity:  Yes  Other Topics Concern  . Not on file  Social History Narrative   [facility] (04/08/17):   Originally from [facility]. Has lived in [place] as well. Previously worked for the Education officer, environmental as a Dealer. Has 2 dogs & a rabbit currently. No bird exposure. He reports black mold was found in his air conditioner at [facility] when it was changed out 2 years ago. No hot tub exposure. Enjoys fishing.    Social Determinants of Health   Financial Resource Strain: Not on file  Food Insecurity: Not on file  Transportation Needs: Not on file  Physical Activity: Not on file  Stress: Not on file  Social Connections: Not on file  Intimate Partner Violence: Not on file   Family History  Problem Relation Age of Onset  . Heart disease Mother   . Atrial fibrillation Mother   . Heart disease Father   . Rheum arthritis Father   . Bladder Cancer Father   . Atrial fibrillation Father   . Brain cancer Maternal Uncle   . Lung disease Neg Hx      Review of Systems  All other systems reviewed and are negative.      Objective:   Physical Exam Vitals and nursing note reviewed.  Constitutional:      General: He is not in acute distress.    Appearance: He is well-developed. He is not diaphoretic.  Cardiovascular:     Rate and Rhythm: Normal rate and regular rhythm.     Heart sounds: Normal heart sounds.  Pulmonary:     Effort: Pulmonary effort is normal. No respiratory distress.     Breath sounds: Normal breath sounds. No wheezing.  Genitourinary:    Prostate: Tender. Not enlarged and no nodules present.     Rectum: Normal.         Assessment & Plan:  Lower urinary tract symptoms (LUTS)  Differential diagnosis includes lower urinary tract symptoms due to overactive bladder versus lower urinary tract symptoms secondary to early prostatitis.  Urinalysis really does not show any evidence of any significant infection.  There is trace blood but otherwise no leukocyte esterase and no  nitrates.  Therefore, options include treating aggressively with antibiotics as he has had 2 recent infections that were confirmed with cultures versus treating for overactive bladder with an anticholinergic medication.  Given his recent infections I wonder treating with Bactrim double strength tablets twice daily for 1 week and see if symptoms improve.  If they do not, I would recommend trying Vesicare for possible OAB.

## 2020-08-21 NOTE — Addendum Note (Signed)
Addended by: Sheral Flow on: 08/21/2020 01:07 PM   Modules accepted: Orders

## 2020-08-31 DIAGNOSIS — H25813 Combined forms of age-related cataract, bilateral: Secondary | ICD-10-CM | POA: Diagnosis not present

## 2020-09-13 ENCOUNTER — Other Ambulatory Visit: Payer: Self-pay

## 2020-09-13 ENCOUNTER — Ambulatory Visit: Payer: Medicare Other | Admitting: Podiatry

## 2020-09-13 ENCOUNTER — Encounter: Payer: Self-pay | Admitting: Podiatry

## 2020-09-13 DIAGNOSIS — L989 Disorder of the skin and subcutaneous tissue, unspecified: Secondary | ICD-10-CM | POA: Diagnosis not present

## 2020-09-13 DIAGNOSIS — Q828 Other specified congenital malformations of skin: Secondary | ICD-10-CM

## 2020-09-13 NOTE — Progress Notes (Signed)
Subjective:  Patient ID: Theodore Davis, male    DOB: 01/16/1946,  MRN: [id number]  Chief Complaint  Patient presents with  . Callouses    Patient presents today for painful callous lesion bottom of right foot x 6-8 months.  He states "it feels like Im walking on a piece of glass"    75 y.o. male presents with the above complaint.  Patient presents with complaint of right submetatarsal 5 porokeratosis/benign skin lesion.  Patient states that it is very painful to walk on it.  Patient states is very painful.  He would like to discuss treatment options for the this.  He has not seen anyone else prior to seeing me.  He feels like his walking on a piece of glass is being on for 6 to 8 months has progressive gotten worse.  He denies any other acute complaints.   Review of Systems: Negative except as noted in the HPI. Denies N/V/F/Ch.  Past Medical History:  Diagnosis Date  . Arthritis 10/03/2010   right sternoclavicular joint  . Heart murmur   . Hemangioma of spine    lower lumbar  . HOH (hard of hearing)    R ear  . Hyperlipidemia    hypercholesterolemia  . Lyme disease     Current Outpatient Medications:  .  finasteride (PROSCAR) 5 MG tablet, , Disp: , Rfl:  .  magnesium oxide (MAG-OX) 400 MG tablet, Take 400 mg by mouth daily. , Disp: , Rfl:  .  RAPAFLO 8 MG CAPS capsule, Take 1 capsule (8 mg total) by mouth daily with breakfast., Disp: 90 capsule, Rfl: 3 .  simvastatin (ZOCOR) 40 MG tablet, TAKE 1 TABLET (40 MG TOTAL) BY MOUTH AT BEDTIME. NEED OV., Disp: 30 tablet, Rfl: 1  Social History   Tobacco Use  Smoking Status Former Smoker  . Packs/day: 0.25  . Years: 50.00  . Pack years: 12.50  . Quit date: 06/23/1997  . Years since quitting: 23.2  Smokeless Tobacco Former User    Allergies  Allergen Reactions  . Codeine Nausea And Vomiting  . Lipitor [Atorvastatin Calcium] Other (See Comments)    myalgias   Objective:  There were no vitals filed for this visit. There is  no height or weight on file to calculate BMI. Constitutional Well developed. Well nourished.  Vascular Dorsalis pedis pulses palpable bilaterally. Posterior tibial pulses palpable bilaterally. Capillary refill normal to all digits.  No cyanosis or clubbing noted. Pedal hair growth normal.  Neurologic Normal speech. Oriented to person, place, and time. Epicritic sensation to light touch grossly present bilaterally.  Dermatologic  hyperkeratotic lesion with central nucleated core noted to the right submetatarsal 5.  Pain on palpation to the lesion.  Benign skin lesion noted.  No ulcer noted.  No pinpoint bleeding noted.  Orthopedic: Normal joint ROM without pain or crepitus bilaterally. No visible deformities. No bony tenderness.   Radiographs: None Assessment:   1. Porokeratosis   2. Benign skin lesion    Plan:  Patient was evaluated and treated and all questions answered.  Right submetatarsal 5 benign skin lesion/porokeratosis -This I explained to the patient the etiology of skin lesion various treatment options were discussed.  Given the amount of pain that he is having I believe patient will benefit from [facility] therapy to help destroy the lesion.  I discussed with him that it takes about 3 application.  Patient states understanding and will follow-up every 2 weeks until resolve meant. --Lesion was debrided today without complications.  Hemostasis was achieved and the area was cleaned. Cantharone was applied followed by an occlusive bandage. Post procedure complications were discussed. Monitor for signs or symptoms of infection and directed to call the office mainly should any occur.   No follow-ups on file.

## 2020-09-15 ENCOUNTER — Other Ambulatory Visit: Payer: Self-pay | Admitting: Cardiovascular Disease

## 2020-09-18 ENCOUNTER — Other Ambulatory Visit (HOSPITAL_COMMUNITY): Payer: Self-pay | Admitting: Orthopedic Surgery

## 2020-09-18 DIAGNOSIS — M4716 Other spondylosis with myelopathy, lumbar region: Secondary | ICD-10-CM | POA: Diagnosis not present

## 2020-09-18 DIAGNOSIS — M545 Low back pain, unspecified: Secondary | ICD-10-CM | POA: Diagnosis not present

## 2020-09-27 ENCOUNTER — Encounter: Payer: Self-pay | Admitting: Podiatry

## 2020-09-27 ENCOUNTER — Other Ambulatory Visit: Payer: Self-pay

## 2020-09-27 ENCOUNTER — Ambulatory Visit: Payer: Medicare Other | Admitting: Podiatry

## 2020-09-27 DIAGNOSIS — Q828 Other specified congenital malformations of skin: Secondary | ICD-10-CM

## 2020-09-27 DIAGNOSIS — L989 Disorder of the skin and subcutaneous tissue, unspecified: Secondary | ICD-10-CM

## 2020-09-27 NOTE — Progress Notes (Signed)
Subjective:  Patient ID: Theodore Davis, male    DOB: 1946/06/20,  MRN: [id number]  Chief Complaint  Patient presents with  . Callouses    "its better.  Its a callous now, but the pain is gone"    75 y.o. male presents with the above complaint.  Patient presents with follow-up right submetatarsal 1 porokeratosis/benign skin lesion.  Patient states is doing a lot better.  He tolerated Cantharone therapy well.  He denies any other acute complaints he does not have any bone pain.   Review of Systems: Negative except as noted in the HPI. Denies N/V/F/Ch.  Past Medical History:  Diagnosis Date  . Arthritis 10/03/2010   right sternoclavicular joint  . Heart murmur   . Hemangioma of spine    lower lumbar  . HOH (hard of hearing)    R ear  . Hyperlipidemia    hypercholesterolemia  . Lyme disease     Current Outpatient Medications:  .  simvastatin (ZOCOR) 40 MG tablet, TAKE 1 TABLET (40 MG TOTAL) BY MOUTH AT BEDTIME. NEED OV., Disp: 30 tablet, Rfl: 1 .  COVID-19 mRNA vaccine, Pfizer, 30 MCG/0.3ML injection, INJECT AS DIRECTED, Disp: .3 mL, Rfl: 0 .  finasteride (PROSCAR) 5 MG tablet, , Disp: , Rfl:  .  magnesium oxide (MAG-OX) 400 MG tablet, Take 400 mg by mouth daily. , Disp: , Rfl:  .  predniSONE (DELTASONE) 5 MG tablet, TAKE 6 TABS BY MOUTH ON DAY 1; 5 TABS ON DAY 2; 4 TABS ON DAY 3; 3 TABS ON DAY 4; 2 TABS ON DAY 5; 1 TAB ON DAY 6 THEN STOP, Disp: 21 tablet, Rfl: 0 .  RAPAFLO 8 MG CAPS capsule, Take 1 capsule (8 mg total) by mouth daily with breakfast., Disp: 90 capsule, Rfl: 3 .  silodosin (RAPAFLO) 8 MG CAPS capsule, TAKE 1 CAPSULE BY MOUTH EVERY NIGHT AT BEDTIME, Disp: 90 capsule, Rfl: 3 .  sulfamethoxazole-trimethoprim (BACTRIM DS) 800-160 MG tablet, TAKE 1 TABLET BY MOUTH TWICE DAILY, Disp: 20 tablet, Rfl: 0  Social History   Tobacco Use  Smoking Status Former Smoker  . Packs/day: 0.25  . Years: 50.00  . Pack years: 12.50  . Quit date: 06/23/1997  . Years since quitting:  23.2  Smokeless Tobacco Former User    Allergies  Allergen Reactions  . Codeine Nausea And Vomiting  . Lipitor [Atorvastatin Calcium] Other (See Comments)    myalgias   Objective:  There were no vitals filed for this visit. There is no height or weight on file to calculate BMI. Constitutional Well developed. Well nourished.  Vascular Dorsalis pedis pulses palpable bilaterally. Posterior tibial pulses palpable bilaterally. Capillary refill normal to all digits.  No cyanosis or clubbing noted. Pedal hair growth normal.  Neurologic Normal speech. Oriented to person, place, and time. Epicritic sensation to light touch grossly present bilaterally.  Dermatologic  no hyperkeratotic lesion with central nucleated core noted to the right submetatarsal 5.  No pain on palpation to the lesion.  No further benign skin lesion noted.  No ulcer noted.  No pinpoint bleeding noted.  Orthopedic: Normal joint ROM without pain or crepitus bilaterally. No visible deformities. No bony tenderness.   Radiographs: None Assessment:   1. Porokeratosis   2. Benign skin lesion    Plan:  Patient was evaluated and treated and all questions answered.  Right submetatarsal 5 benign skin lesion/porokeratosis -Clinically healed.  No further signs of porokeratotic lesions noted.  No skin lesion noted.  I  discussed with the patient shoe gear modification as well as offloading if it recurs.  If any foot and ankle issues arise in future come back and see me.  He states understanding   No follow-ups on file.

## 2020-10-09 ENCOUNTER — Telehealth: Payer: Self-pay | Admitting: Family Medicine

## 2020-10-09 NOTE — Telephone Encounter (Signed)
Copied from [place] [id number]. Topic: Medicare AWV >> Oct 09, 2020  2:16 PM Cher Nakai R wrote: Reason for CRM:   No answer unable to leave a message for patient to call back and schedule Medicare Annual Wellness Visit (AWV) in office.   If not able to come in office, please offer to do virtually or by telephone.   Last AWV:  01/13/2018  Please schedule at anytime with [facility]-Nurse Health Advisor.  If any questions, please contact me at [phone]

## 2020-10-12 ENCOUNTER — Other Ambulatory Visit: Payer: Self-pay | Admitting: Cardiovascular Disease

## 2020-11-27 ENCOUNTER — Ambulatory Visit (INDEPENDENT_AMBULATORY_CARE_PROVIDER_SITE_OTHER): Payer: Medicare Other

## 2020-11-27 ENCOUNTER — Other Ambulatory Visit: Payer: Self-pay

## 2020-11-27 ENCOUNTER — Ambulatory Visit: Payer: Medicare Other | Admitting: Podiatry

## 2020-11-27 DIAGNOSIS — M7731 Calcaneal spur, right foot: Secondary | ICD-10-CM | POA: Diagnosis not present

## 2020-11-27 DIAGNOSIS — M722 Plantar fascial fibromatosis: Secondary | ICD-10-CM

## 2020-11-28 ENCOUNTER — Encounter: Payer: Self-pay | Admitting: Podiatry

## 2020-11-28 NOTE — Progress Notes (Signed)
Subjective:  Patient ID: Theodore Davis, male    DOB: Feb 20, 1946,  MRN: [id number]  No chief complaint on file.   75 y.o. male presents with the above complaint.  Patient presents with a new complaint of right heel pain that has been going on for quite some time.  Patient states when he takes a step he feels like he is walking on a nail and therefore a bruise.  He states it hurts to take the first up in the morning.  Has progressive gotten worse.  He would like to discuss treatment options he has not had any history of plantar fasciitis.  He has not seen anyone else prior to seeing me for this.  He denies any other acute complaints.   Review of Systems: Negative except as noted in the HPI. Denies N/V/F/Ch.  Past Medical History:  Diagnosis Date  . Arthritis 10/03/2010   right sternoclavicular joint  . Heart murmur   . Hemangioma of spine    lower lumbar  . HOH (hard of hearing)    R ear  . Hyperlipidemia    hypercholesterolemia  . Lyme disease     Current Outpatient Medications:  .  COVID-19 mRNA vaccine, Pfizer, 30 MCG/0.3ML injection, INJECT AS DIRECTED, Disp: .3 mL, Rfl: 0 .  finasteride (PROSCAR) 5 MG tablet, , Disp: , Rfl:  .  magnesium oxide (MAG-OX) 400 MG tablet, Take 400 mg by mouth daily. , Disp: , Rfl:  .  predniSONE (DELTASONE) 5 MG tablet, TAKE 6 TABS BY MOUTH ON DAY 1; 5 TABS ON DAY 2; 4 TABS ON DAY 3; 3 TABS ON DAY 4; 2 TABS ON DAY 5; 1 TAB ON DAY 6 THEN STOP, Disp: 21 tablet, Rfl: 0 .  RAPAFLO 8 MG CAPS capsule, Take 1 capsule (8 mg total) by mouth daily with breakfast., Disp: 90 capsule, Rfl: 3 .  silodosin (RAPAFLO) 8 MG CAPS capsule, TAKE 1 CAPSULE BY MOUTH EVERY NIGHT AT BEDTIME, Disp: 90 capsule, Rfl: 3 .  simvastatin (ZOCOR) 40 MG tablet, TAKE 1 TABLET (40 MG TOTAL) BY MOUTH AT BEDTIME. NEED OV., Disp: 30 tablet, Rfl: 2 .  sulfamethoxazole-trimethoprim (BACTRIM DS) 800-160 MG tablet, TAKE 1 TABLET BY MOUTH TWICE DAILY, Disp: 20 tablet, Rfl: 0  Social History    Tobacco Use  Smoking Status Former Smoker  . Packs/day: 0.25  . Years: 50.00  . Pack years: 12.50  . Quit date: 06/23/1997  . Years since quitting: 23.4  Smokeless Tobacco Former User    Allergies  Allergen Reactions  . Codeine Nausea And Vomiting  . Lipitor [Atorvastatin Calcium] Other (See Comments)    myalgias   Objective:  There were no vitals filed for this visit. There is no height or weight on file to calculate BMI. Constitutional Well developed. Well nourished.  Vascular Dorsalis pedis pulses palpable bilaterally. Posterior tibial pulses palpable bilaterally. Capillary refill normal to all digits.  No cyanosis or clubbing noted. Pedal hair growth normal.  Neurologic Normal speech. Oriented to person, place, and time. Epicritic sensation to light touch grossly present bilaterally.  Dermatologic Nails well groomed and normal in appearance. No open wounds. No skin lesions.  Orthopedic: Normal joint ROM without pain or crepitus bilaterally. No visible deformities. Tender to palpation at the calcaneal tuber right. No pain with calcaneal squeeze right. Ankle ROM diminished range of motion right. Silfverskiold Test: positive right.   Radiographs: Taken and reviewed. No acute fractures or dislocations. No evidence of stress fracture.  Plantar heel  spur present. Posterior heel spur absent.   Assessment:   1. Plantar fasciitis of right foot   2. Heel spur, right    Plan:  Patient was evaluated and treated and all questions answered.  Plantar Fasciitis, right with underlying heel spur - XR reviewed as above.  - Educated on icing and stretching. Instructions given.  - Injection delivered to the plantar fascia as below. - DME: Plantar Fascial Brace - Pharmacologic management: None  Procedure: Injection Tendon/Ligament Location: Right plantar fascia at the glabrous junction; medial approach. Skin Prep: alcohol Injectate: 0.5 cc 0.5% marcaine plain, 0.5 cc of 1%  Lidocaine, 0.5 cc kenalog 10. Disposition: Patient tolerated procedure well. Injection site dressed with a band-aid.  No follow-ups on file.

## 2020-12-20 ENCOUNTER — Ambulatory Visit: Payer: Medicare Other | Admitting: Podiatry

## 2020-12-20 ENCOUNTER — Other Ambulatory Visit: Payer: Self-pay

## 2020-12-20 DIAGNOSIS — M722 Plantar fascial fibromatosis: Secondary | ICD-10-CM | POA: Diagnosis not present

## 2020-12-20 DIAGNOSIS — M7731 Calcaneal spur, right foot: Secondary | ICD-10-CM | POA: Diagnosis not present

## 2020-12-24 NOTE — Progress Notes (Signed)
Subjective:  Patient ID: Theodore Davis, male    DOB: 24-Nov-1945,  MRN: [id number]  Chief Complaint  Patient presents with   Follow-up    3-4 wk follow up of PF    75 y.o. male presents with the above complaint.  Patient presents for follow-up of left Planter fasciitis.  He states that the injection in the boot helped some.  He states his been wearing his plantar fasciitis.  He still has some pain mostly around the heel now.  It has improved since the first time is here.  She denies any other acute complaints he would like to know if he can do another shot.   Review of Systems: Negative except as noted in the HPI. Denies N/V/F/Ch.  Past Medical History:  Diagnosis Date   Arthritis 10/03/2010   right sternoclavicular joint   Heart murmur    Hemangioma of spine    lower lumbar   HOH (hard of hearing)    R ear   Hyperlipidemia    hypercholesterolemia   Lyme disease     Current Outpatient Medications:    COVID-19 mRNA vaccine, Pfizer, 30 MCG/0.3ML injection, INJECT AS DIRECTED, Disp: .3 mL, Rfl: 0   finasteride (PROSCAR) 5 MG tablet, , Disp: , Rfl:    magnesium oxide (MAG-OX) 400 MG tablet, Take 400 mg by mouth daily. , Disp: , Rfl:    predniSONE (DELTASONE) 5 MG tablet, TAKE 6 TABS BY MOUTH ON DAY 1; 5 TABS ON DAY 2; 4 TABS ON DAY 3; 3 TABS ON DAY 4; 2 TABS ON DAY 5; 1 TAB ON DAY 6 THEN STOP, Disp: 21 tablet, Rfl: 0   RAPAFLO 8 MG CAPS capsule, Take 1 capsule (8 mg total) by mouth daily with breakfast., Disp: 90 capsule, Rfl: 3   silodosin (RAPAFLO) 8 MG CAPS capsule, TAKE 1 CAPSULE BY MOUTH EVERY NIGHT AT BEDTIME, Disp: 90 capsule, Rfl: 3   simvastatin (ZOCOR) 40 MG tablet, TAKE 1 TABLET (40 MG TOTAL) BY MOUTH AT BEDTIME. NEED OV., Disp: 30 tablet, Rfl: 2   sulfamethoxazole-trimethoprim (BACTRIM DS) 800-160 MG tablet, TAKE 1 TABLET BY MOUTH TWICE DAILY, Disp: 20 tablet, Rfl: 0  Social History   Tobacco Use  Smoking Status Former   Packs/day: 0.25   Years: 50.00   Pack  years: 12.50   Types: Cigarettes   Quit date: 06/23/1997   Years since quitting: 23.5  Smokeless Tobacco Former    Allergies  Allergen Reactions   Codeine Nausea And Vomiting   Lipitor [Atorvastatin Calcium] Other (See Comments)    myalgias   Objective:  There were no vitals filed for this visit. There is no height or weight on file to calculate BMI. Constitutional Well developed. Well nourished.  Vascular Dorsalis pedis pulses palpable bilaterally. Posterior tibial pulses palpable bilaterally. Capillary refill normal to all digits.  No cyanosis or clubbing noted. Pedal hair growth normal.  Neurologic Normal speech. Oriented to person, place, and time. Epicritic sensation to light touch grossly present bilaterally.  Dermatologic Nails well groomed and normal in appearance. No open wounds. No skin lesions.  Orthopedic: Normal joint ROM without pain or crepitus bilaterally. No visible deformities. Tender to palpation at the calcaneal tuber right. No pain with calcaneal squeeze right. Ankle ROM diminished range of motion right. Silfverskiold Test: positive right.   Radiographs: Taken and reviewed. No acute fractures or dislocations. No evidence of stress fracture.  Plantar heel spur present. Posterior heel spur absent.   Assessment:   1.  Plantar fasciitis of right foot   2. Heel spur, right     Plan:  Patient was evaluated and treated and all questions answered.  Plantar Fasciitis, right with underlying heel spur - XR reviewed as above.  - Educated on icing and stretching. Instructions given.  -Second injection delivered to the plantar fascia as below. - DME: Night splint - Pharmacologic management: None  Procedure: Injection Tendon/Ligament Location: Right plantar fascia at the glabrous junction; medial approach. Skin Prep: alcohol Injectate: 0.5 cc 0.5% marcaine plain, 0.5 cc of 1% Lidocaine, 0.5 cc kenalog 10. Disposition: Patient tolerated procedure well.  Injection site dressed with a band-aid.  No follow-ups on file.

## 2020-12-27 ENCOUNTER — Other Ambulatory Visit: Payer: Self-pay

## 2020-12-27 ENCOUNTER — Ambulatory Visit: Payer: Medicare Other | Admitting: Cardiovascular Disease

## 2020-12-27 VITALS — BP 116/68 | HR 52 | Ht 69.0 in | Wt 186.0 lb

## 2020-12-27 DIAGNOSIS — I358 Other nonrheumatic aortic valve disorders: Secondary | ICD-10-CM | POA: Diagnosis not present

## 2020-12-27 DIAGNOSIS — U071 COVID-19: Secondary | ICD-10-CM | POA: Diagnosis not present

## 2020-12-27 DIAGNOSIS — E785 Hyperlipidemia, unspecified: Secondary | ICD-10-CM

## 2020-12-27 DIAGNOSIS — R011 Cardiac murmur, unspecified: Secondary | ICD-10-CM

## 2020-12-27 DIAGNOSIS — I5189 Other ill-defined heart diseases: Secondary | ICD-10-CM

## 2020-12-27 NOTE — Patient Instructions (Signed)
Medication Instructions:  Your physician recommends that you continue on your current medications as directed. Please refer to the Current Medication list given to you today.  *If you need a refill on your cardiac medications before your next appointment, please call your pharmacy*  Testing/Procedures: Your physician has requested that you have an echocardiogram. Echocardiography is a painless test that uses sound waves to create images of your heart. It provides your doctor with information about the size and shape of your heart and how well your heart's chambers and valves are working. This procedure takes approximately one hour. There are no restrictions for this procedure.  This will be done at our [facility] location:  [place]: At [employer], you and your health needs are our priority.  As part of our continuing mission to provide you with exceptional heart care, we have created designated Provider Care Teams.  These Care Teams include your primary Cardiologist (physician) and Advanced Practice Providers (APPs -  Physician Assistants and Nurse Practitioners) who all work together to provide you with the care you need, when you need it.  We recommend signing up for the patient portal called "MyChart".  Sign up information is provided on this After Visit Summary.  MyChart is used to connect with patients for Virtual Visits (Telemedicine).  Patients are able to view lab/test results, encounter notes, upcoming appointments, etc.  Non-urgent messages can be sent to your provider as well.   To learn more about what you can do with MyChart, go to [email or web address].    Your next appointment:   12 month(s)  The format for your next appointment:   In Person  Provider:   Shelva Majestic, MD

## 2020-12-27 NOTE — Progress Notes (Addendum)
Patient ID: Theodore Davis, male   DOB: 1946-04-10, 75 y.o.   MRN: [id number]   Primary M.D.: Dr. Margaretmary Eddy  HPI: Theodore Davis is a 75 y.o. male who presents to the office today for a 32 month follow-up cardiology evaluation.  I last saw him in November 2019.  Theodore Davis has a strong family history for CAD in both his mother and father.  He also has a history of hyperlipidemia.  I had seen him in 2012 and at that time an echo Doppler study was essentially normal.  A nuclear perfusion study revealed normal perfusion and function.  Post-rest ejection fraction was 68%.   I had not seen him in over 5-1/2 years but saw him when he was referred by Dr. Dennard Schaumann to evaluate episodes of shortness of breath with activity. Oftentimes his shortness of breath is worse when he wakes up.  He denied any definitive chest pain.  He does admit to some increased anxiety.  He also has had issues with leg cramps but has been taking supplemental magnesium with benefit.  He was put on Rapaflo by his urologist to improve urine flow.  This has resulted in increased episodes of shortness of breath.    In 2016 laboratory revealed a normal BNP at 21.7.  He was not anemic with a hemoglobin of 15 and hematocrit of 43.8. Chemistry panel was normal, although glucose was minimally increased at 106, and bilirubin at 1.4 but other LFTs were normal. PSA was 0.45.  Lipid studies revealed a total cholesterol 164, triglycerides 80, HDL 56, and LDL 92.  TSH was 0.349. A chest x-ray in November showed stable right infrahilar prominence, which was present on a prior chest x-ray of November 2012.  There was no acute cardiopulmonary abnormality.  An echo Doppler study on 05/15/2015 showed an ejection fraction at 55-60% with normal wall motion.  There was a suggestion of mild grade 1 diastolic dysfunction.  Tissue Doppler was indeterminant.  There was mild thickening of the mitral valve with trivial mitral regurgitation.     He has been  retired for many years.  I saw him in January 2018 that time he denied any chest pain.  He had joined a gym and was exercising.  He had lost 10 pounds.   He underwent an echo Doppler study in October 2018 ordered by Dr. Ashok Cordia which showed normal systolic function.  There was mild focal basal hypertrophy of the septum.  EF is 55 to 60% and there was grade 1 diastolic dysfunction.  There was mild aortic sclerosis without stenosis.  In January 2018 he underwent a chest CT ordered by Dr. Lake Bells.  He had mildly enlarged mediastinal lymph nodes, potentially reactive in etiology which are grossly unchanged from previous assessment.  He also was noted to have aortic atherosclerosis.  When I last saw him in November 2019 he was feeling well and denied chest pain PND orthopnea.  Laboratory in July 2019 showed an LDL at 7 and he has been on simvastatin 20 mg.    Since I last saw him, he underwent an echo Doppler study in August 2020 which revealed normal LV systolic function with EF of 55 to 60% with grade 1 diastolic dysfunction.  There was mild mitral annular calcification and mild aortic sclerosis without stenosis.  He developed a COVID infection in November/December 2021 and lost his sense of taste and smell.  He has received the [employer] vaccination.  He has been caring for his  father-in-law who had had a stroke.  He denies any recent chest pain.  He states his blood pressure at home typically runs between 834-196 systolically with diastolics around 22-29.  He denies any chest pain or significant shortness of breath.  At times he notes very rare dizzy spells.  He continues to be followed by Dr. Dennard Schaumann.  He is on simvastatin 40 mg for hyperlipidemia.  He is on finasteride 5 mg and silodosin 8 mg at bedtime.  Past Medical History:  Diagnosis Date   Arthritis 10/03/2010   right sternoclavicular joint   Heart murmur    Hemangioma of spine    lower lumbar   HOH (hard of hearing)    R ear   Hyperlipidemia     hypercholesterolemia   Lyme disease     Past Surgical History:  Procedure Laterality Date   CARDIOVASCULAR STRESS TEST  08/2010   CHOLECYSTECTOMY     HERNIA REPAIR     R inguinal   JOINT REPLACEMENT Right    MASTOID DEBRIDEMENT     R ear   RESECTION DISTAL CLAVICAL Left 11/30/2014   Procedure: RESECTION DISTAL CLAVICAL;  Surgeon: Kathryne Hitch, MD;  Location: [place];  Service: Orthopedics;  Laterality: Left;   SHOULDER ARTHROSCOPY WITH BICEPSTENOTOMY Left 11/30/2014   Procedure: SHOULDER ARTHROSCOPY WITH BICEPSTENOTOMY;  Surgeon: Kathryne Hitch, MD;  Location: [place];  Service: Orthopedics;  Laterality: Left;   SHOULDER ARTHROSCOPY WITH ROTATOR CUFF REPAIR AND SUBACROMIAL DECOMPRESSION Left 11/30/2014   Procedure: LEFT SHOULDER ARTHROSCOPY WITH DISTAL CLAVICAL EXCISION, ACROMIOPLASTY, ROTATOR CUFF REPAIR , BICEPS TENOTOMY;  Surgeon: Kathryne Hitch, MD;  Location: [place];  Service: Orthopedics;  Laterality: Left;   TENDON REPAIR  2005   R elbow   TOTAL KNEE ARTHROPLASTY  05/28/2011   Procedure: TOTAL KNEE ARTHROPLASTY;  Surgeon: Ninetta Lights, MD;  Location: [place];  Service: Orthopedics;  Laterality: Right;  Right total knee arthroplasty    US ECHOCARDIOGRAPHY  08/2010    Allergies  Allergen Reactions   Codeine Nausea And Vomiting   Lipitor [Atorvastatin Calcium] Other (See Comments)    myalgias    Current Outpatient Medications  Medication Sig Dispense Refill   COVID-19 mRNA vaccine, Pfizer, 30 MCG/0.3ML injection INJECT AS DIRECTED .3 mL 0   finasteride (PROSCAR) 5 MG tablet      magnesium oxide (MAG-OX) 400 MG tablet Take 400 mg by mouth daily.      silodosin (RAPAFLO) 8 MG CAPS capsule TAKE 1 CAPSULE BY MOUTH EVERY NIGHT AT BEDTIME 90 capsule 3   simvastatin (ZOCOR) 40 MG tablet TAKE 1 TABLET (40 MG TOTAL) BY MOUTH AT BEDTIME. NEED OV. 30 tablet 2   No current facility-administered medications for this visit.    Social  History   Socioeconomic History   Marital status: Married    Spouse name: Not on file   Number of children: Not on file   Years of education: Not on file   Highest education level: Not on file  Occupational History   Not on file  Tobacco Use   Smoking status: Former    Packs/day: 0.25    Years: 50.00    Pack years: 12.50    Types: Cigarettes    Quit date: 06/23/1997    Years since quitting: 23.5   Smokeless tobacco: Former  Scientific laboratory technician Use: Never used  Substance and Sexual Activity   Alcohol use: Yes    Comment: occasional beer  Drug use: No   Sexual activity: Yes  Other Topics Concern   Not on file  Social History Narrative   [facility] (04/08/17):   Originally from [facility]. Has lived in [place] as well. Previously worked for the Education officer, environmental as a Dealer. Has 2 dogs & a rabbit currently. No bird exposure. He reports black mold was found in his air conditioner at [facility] when it was changed out 2 years ago. No hot tub exposure. Enjoys fishing.    Social Determinants of Health   Financial Resource Strain: Not on file  Food Insecurity: Not on file  Transportation Needs: Not on file  Physical Activity: Not on file  Stress: Not on file  Social Connections: Not on file  Intimate Partner Violence: Not on file   Additional social history is notable that he is retired from  [employer].  He is married for 37 years and has 2 children, ages 60 and 2.  He has smoked for 25-30 years but quit 17 years ago.  He exercises occasionally only 4-5 times per month.  Family History  Problem Relation Age of Onset   Heart disease Mother    Atrial fibrillation Mother    Heart disease Father    Rheum arthritis Father    Bladder Cancer Father    Atrial fibrillation Father    Brain cancer Maternal Uncle    Lung disease Neg Hx    Additional family history is notable in that his mother died with a myocardial infarction but had a history of atrial fibrillation.  Father also  had valve disease and died in 09-20-2012.  ROS General: Negative; No fevers, chills, or night sweats HEENT: Negative; No changes in vision or hearing, sinus congestion, difficulty swallowing Pulmonary: Positive for seasonal allergies; Negative; No cough, wheezing, shortness of breath, hemoptysis Cardiovascular: See HPI:  GI: Negative; No nausea, vomiting, diarrhea, or abdominal pain GU: Negative; No dysuria, hematuria, or difficulty voiding Musculoskeletal: Negative; no myalgias, joint pain, or weakness Hematologic: Negative; no easy bruising, bleeding Endocrine: Negative; no heat/cold intolerance; no diabetes, Neuro: Negative; no changes in balance, headaches Skin: Negative; No rashes or skin lesions Psychiatric: Positive for anxiety Sleep: Negative sleep study in 09-20-16 interpreted by Dr. Halford Chessman; No snoring,  daytime sleepiness, hypersomnolence, bruxism, restless legs, hypnogognic hallucinations. Other comprehensive 14 point system review is negative   Physical Exam BP 116/68   Pulse (!) 52   Ht $R'5\' 9"'po$  (1.753 m)   Wt 186 lb (84.4 kg)   BMI 27.47 kg/m    Repeat blood pressure by me 128/70  Wt Readings from Last 3 Encounters:  12/27/20 186 lb (84.4 kg)  08/21/20 196 lb (88.9 kg)  07/24/20 192 lb (87.1 kg)   General: Alert, oriented, no distress.  Skin: normal turgor, no rashes, warm and dry HEENT: Normocephalic, atraumatic. Pupils equal round and reactive to light; sclera anicteric; extraocular muscles intact;  Nose without nasal septal hypertrophy Mouth/Parynx benign; Mallinpatti scale 2 Neck: No JVD, no carotid bruits; normal carotid upstroke Lungs: clear to ausculatation and percussion; no wheezing or rales Chest wall: without tenderness to palpitation Heart: PMI not displaced, RRR, s1 s2 normal, 2/6 systolic murmur along the right and left sternal border, no diastolic murmur, no rubs, gallops, thrills, or heaves Abdomen: soft, nontender; no hepatosplenomehaly, BS+; abdominal aorta  nontender and not dilated by palpation. Back: no CVA tenderness Pulses 2+ Musculoskeletal: full range of motion, normal strength, no joint deformities Extremities: no clubbing cyanosis or edema, Homan's sign  negative  Neurologic: grossly nonfocal; Cranial nerves grossly wnl Psychologic: Normal mood and affect   ECG (independently read by me):  Sinus bradycardia at 53; no ectopy, normal intervals  November 2019 ECG (independently read by me): NSR at 60; Q wave in 3, normal intervals  January 2018 ECG (independently read by me): Normal sinus rhythm at 68 bpm.  Q wave in lead 3 and aVF.  January 2017 ECG (independently read by me):  Normal sinus rhythm at 61 bpm.  No ectopy.  Normal intervals.  November 2016 ECG (independently read by me): Sinus bradycardia 55 bpm.  Inferior infarct by ECG with Q waves in 3 and aVF.  LABS:  BMP Latest Ref Rng & Units 05/30/2020 03/21/2020 09/21/2019  Glucose 65 - 99 mg/dL 116(H) 115(H) 115(H)  BUN 7 - 25 mg/dL $Remove'13 11 14  'bYPgHRj$ Creatinine 0.70 - 1.18 mg/dL 0.91 0.9 0.98  BUN/Creat Ratio 6 - 22 (calc) NOT APPLICABLE NOT APPLICABLE NOT APPLICABLE  Sodium 768 - 146 mmol/L 139 136 141  Potassium 3.5 - 5.3 mmol/L 4.1 4.3 4.6  Chloride 98 - 110 mmol/L 103 101 107  CO2 20 - 32 mmol/L $RemoveB'30 25 27  'wyPYeoDh$ Calcium 8.6 - 10.3 mg/dL 8.6 8.9 8.6    Hepatic Function Latest Ref Rng & Units 05/30/2020 03/21/2020 09/21/2019  Total Protein 6.1 - 8.1 g/dL 6.6 6.7 6.1  Albumin 3.8 - 4.8 g/dL - - -  AST 10 - 35 U/L $Remo'18 18 17  'jZHCl$ ALT 9 - 46 U/L $Remo'20 19 16  'NxXmq$ Alk Phosphatase 39 - 117 IU/L - - -  Total Bilirubin 0.2 - 1.2 mg/dL 0.6 4.0(H) 1.0  Bilirubin, Direct 0.0 - 0.3 mg/dL - - -    CBC Latest Ref Rng & Units 05/30/2020 03/21/2020 09/21/2019  WBC 3.8 - 10.8 Thousand/uL 7.9 16.4(H) 5.7  Hemoglobin 13.2 - 17.1 g/dL 12.7(L) 15.8 14.9  Hematocrit 38.5 - 50.0 % 37.0(L) 45.4 44.4  Platelets 140 - 400 Thousand/uL 405(H) 198 211   Lab Results  Component Value Date   MCV 88.7 05/30/2020   MCV 90.6  03/21/2020   MCV 91.0 09/21/2019    Lab Results  Component Value Date   TSH 0.30 (L) 11/10/2016    BNP    Component Value Date/Time   BNP 21.7 05/07/2015 1131    ProBNP No results found for: PROBNP   Lipid Panel     Component Value Date/Time   CHOL 150 09/21/2019 1016   CHOL 156 08/09/2018 0923   TRIG 72 09/21/2019 1016   HDL 58 09/21/2019 1016   HDL 57 08/09/2018 0923   CHOLHDL 2.6 09/21/2019 1016   VLDL 16 12/29/2016 0951   LDLCALC 77 09/21/2019 1016     RADIOLOGY: No results found.  IMPRESSION:  1. Cardiac murmur   2. Aortic valve sclerosis   3. Grade I diastolic dysfunction   4. Hyperlipidemia LDL goal <70   5. COVID: Nov/Dec 2021     ASSESSMENT AND PLAN: [name] is a 75 year-old Caucasian male who has a significant family history of  CAD.  He has a history of hyperlipidemia and has been on simvastatin 40 mg daily since his dose was titrated by me in 2019.  LDL cholesterol in March 2021 was 77.  In 2012, he had normal echo Doppler study and normal myocardial perfusion.  An echo Doppler study in November 2016 revealed normal systolic function and suggested mild grade 1 diastolic dysfunction.  He did not have any wall motion abnormality.  His ECG have demonstrated inferior Q waves.  Since I last saw him in 2019 a follow-up echo Doppler study was done in August 2020 which showed an EF of 55 to 28%, grade 1 diastolic dysfunction, mild mitral annular calcification and mild aortic sclerosis without stenosis.  On examination today he has a 2/6 systolic murmur most likely due to his aortic valve sclerosis.  He is euvolemic on exam.  Blood pressure is stable.  He denies any chest pain or awareness of palpitations.  He has had issues with bladder infections and is on finasteride and silodosin for urinary retention.  He had a COVID infection in November/December 2021 and lost his taste and smell.  This subsequently triggered an exacerbation of reactive airway disease  for which he was evaluated by Dr. Dennard Schaumann.  Presently, from a cardiac standpoint he remains stable.  He continues to care for his father-in-law who had a stroke.  I will see him in 1 year for follow-up evaluation or sooner as needed.   Theodore Sine, MD, [facility]  01/01/2021 4:18 PM

## 2021-01-01 ENCOUNTER — Encounter: Payer: Self-pay | Admitting: Cardiovascular Disease

## 2021-01-17 ENCOUNTER — Other Ambulatory Visit: Payer: Self-pay

## 2021-01-17 ENCOUNTER — Ambulatory Visit: Payer: Medicare Other | Admitting: Podiatry

## 2021-01-17 DIAGNOSIS — M722 Plantar fascial fibromatosis: Secondary | ICD-10-CM

## 2021-01-17 NOTE — Progress Notes (Signed)
  Subjective:  Patient ID: Theodore Davis, male    DOB: [birth year],  MRN: [id number]  Chief Complaint  Patient presents with   Plantar Fasciitis    PT stated that he is doing great the pain is almost gone     75 y.o. male presents with the above complaint.  Patient presents with follow-up of right Planter fasciitis.  Patient states is doing a lot better has improved considerably with the injection.  The bracing and night splint has helped considerably.  He denies any other acute complaints.   Review of Systems: Negative except as noted in the HPI. Denies N/V/F/Ch.  Past Medical History:  Diagnosis Date   Arthritis 10/03/2010   right sternoclavicular joint   Heart murmur    Hemangioma of spine    lower lumbar   HOH (hard of hearing)    R ear   Hyperlipidemia    hypercholesterolemia   Lyme disease     Current Outpatient Medications:    COVID-19 mRNA vaccine, Pfizer, 30 MCG/0.3ML injection, INJECT AS DIRECTED, Disp: .3 mL, Rfl: 0   finasteride (PROSCAR) 5 MG tablet, , Disp: , Rfl:    magnesium oxide (MAG-OX) 400 MG tablet, Take 400 mg by mouth daily. , Disp: , Rfl:    silodosin (RAPAFLO) 8 MG CAPS capsule, TAKE 1 CAPSULE BY MOUTH EVERY NIGHT AT BEDTIME, Disp: 90 capsule, Rfl: 3   simvastatin (ZOCOR) 40 MG tablet, TAKE 1 TABLET (40 MG TOTAL) BY MOUTH AT BEDTIME. NEED OV., Disp: 30 tablet, Rfl: 2  Social History   Tobacco Use  Smoking Status Former   Packs/day: 0.25   Years: 50.00   Pack years: 12.50   Types: Cigarettes   Quit date: 06/23/1997   Years since quitting: 23.5  Smokeless Tobacco Former    Allergies  Allergen Reactions   Codeine Nausea And Vomiting   Lipitor [Atorvastatin Calcium] Other (See Comments)    myalgias   Objective:  There were no vitals filed for this visit. There is no height or weight on file to calculate BMI. Constitutional Well developed. Well nourished.  Vascular Dorsalis pedis pulses palpable bilaterally. Posterior tibial pulses  palpable bilaterally. Capillary refill normal to all digits.  No cyanosis or clubbing noted. Pedal hair growth normal.  Neurologic Normal speech. Oriented to person, place, and time. Epicritic sensation to light touch grossly present bilaterally.  Dermatologic Nails well groomed and normal in appearance. No open wounds. No skin lesions.  Orthopedic: Normal joint ROM without pain or crepitus bilaterally. No visible deformities. No further tender to palpation at the calcaneal tuber right. No pain with calcaneal squeeze right. Ankle ROM diminished range of motion right. Silfverskiold Test: positive right.   Radiographs: Taken and reviewed. No acute fractures or dislocations. No evidence of stress fracture.  Plantar heel spur present. Posterior heel spur absent.   Assessment:   1. Plantar fasciitis of right foot      Plan:  Patient was evaluated and treated and all questions answered.  Plantar Fasciitis, right with underlying heel spur -Clinically healed with 2 steroid injection.  I discussed with him the importance of prevention with orthotics/insoles.  Patient has obtained over-the-counter insoles which seems to be functioning well.  I discussed with him shoe gear modification as well.  At this time if any foot and ankle issues arises in the future I asked him to come back and see me.  He states understanding  No follow-ups on file.

## 2021-01-18 ENCOUNTER — Other Ambulatory Visit: Payer: Self-pay | Admitting: Cardiovascular Disease

## 2021-01-21 ENCOUNTER — Ambulatory Visit (HOSPITAL_COMMUNITY): Payer: Medicare Other | Attending: Internal Medicine

## 2021-01-21 ENCOUNTER — Other Ambulatory Visit: Payer: Self-pay

## 2021-01-21 DIAGNOSIS — R011 Cardiac murmur, unspecified: Secondary | ICD-10-CM | POA: Diagnosis not present

## 2021-01-21 LAB — ECHOCARDIOGRAM COMPLETE
Area-P 1/2: 3.53 cm2
S' Lateral: 2.75 cm

## 2021-03-31 ENCOUNTER — Emergency Department: Admit: 2021-03-31 | Payer: MEDICARE | Primary: Internal Medicine

## 2021-03-31 ENCOUNTER — Inpatient Hospital Stay: Admit: 2021-03-31 | Discharge: 2021-03-31 | Disposition: A | Discharge status: Home / Self Care | Payer: MEDICARE

## 2021-03-31 DIAGNOSIS — J069 Acute upper respiratory infection, unspecified: Secondary | ICD-10-CM

## 2021-03-31 MED ORDER — PREDNISONE 10 MG PO TABS
10 MG | ORAL_TABLET | Freq: Every day | ORAL | 0 refills | Status: AC
Start: 2021-03-31 — End: 2021-04-05

## 2021-03-31 MED ORDER — DOXYCYCLINE HYCLATE 100 MG PO TABS
100 MG | ORAL_TABLET | Freq: Two times a day (BID) | ORAL | 0 refills | Status: AC
Start: 2021-03-31 — End: 2021-04-10

## 2021-03-31 NOTE — ED Provider Notes (Addendum)
Department of Emergency Medicine   [facility]  Provider Note  Admit Date/RoomTime: 03/31/2021  8:43 AM  Room: 03/03  Chief Complaint   Cough (Went to other UC on the 29th, had augmentin, finished it yesterday, states he is still having chest congestion, on albuterol in nebulizer, states he wheezes at night)    History of Present Illness   Source of history provided by:  Patient.  History/Exam Limitations: None.    Troy Mcdaniel is a 75 y.o. male with a history of asthma. He reports a nearly 3-week history of upper respiratory symptoms and productive cough.  Was seen at another facility approximately 2 weeks ago where he had a negative COVID test and was given Augmentin which he finished a few days ago.  Continues to have a cough as well as some wheezing which has worsened over the last few days.  Is been using his albuterol only very intermittently.  Denies any chest pain or shortness of breath. Denies any fevers or chills. No myalgias or arthralgias. Denies any hemoptysis. Denies any abdominal pain, vomiting, or diarrhea. Does not smoke.        ROS    Pertinent positives and negatives are stated within HPI, all other systems reviewed and are negative.    Past Surgical History:   Procedure Laterality Date    PROSTATECTOMY     Social History:  reports that he has quit smoking. He has never used smokeless tobacco. He reports current alcohol use of about 1.0 standard drink per week. He reports that he does not use drugs.  Family History: family history includes Cancer in his father; Dementia in his mother.   Allergies: Patient has no known allergies.    Physical Exam            ED Triage Vitals [03/31/21 0846]   BP Temp Temp src Heart Rate Resp SpO2 Height Weight   135/62 98.4 ??F (36.9 ??C) -- 58 16 97 % -- 210 lb (95.3 kg)      Oxygen Saturation Interpretation: Normal.    Gen.: Vitals noted no distress. Afebrile. Normal phonation, no stridor, no trismus.  Neck: Supple. No meningismus  through full range of motion. No anterior cervical or submandibular lymphadenopathy. No posterior lymphadenopathy.   Cardiac: Regular rate rhythm no murmur.   Lungs: Few scattered end expiratory wheezes with good aeration.  Abdomen: Soft, nontender, nonsurgical throughout. Normoactive bowel sounds.   Extremities: No peripheral edema, negative Homans bilaterally no cords.   Skin: No rash.   Neuro: No gross neurologic deficits.    Lab / Imaging Results   (All laboratory and radiology results have been personally reviewed by myself)  Labs:  No results found for this visit on 03/31/21.  Imaging:  All Radiology results interpreted by Radiologist unless otherwise noted.  XR CHEST (2 VW)   Final Result   No acute process.           ED Course / Medical Decision Making   Medications - No data to display       Differential Diagnosis: Is extensive but includes viral URI, CoVID-19, sinusitis, otitis media/externa, exudative pharyngitis, pneumonia, peritonsillar abscess, etc.    MDM:     This is a 75 y.o. male who presents with the above history. Exam is remarkable for some very minimal wheezing.  No evidence of localizing infection or PNA. Nothing to suggest pulmonary embolism or pneumothorax or other life threatening condition.  Chest x-ray was negative.  COVID was negative at the previous visit.  Patient is well appearing and nontoxic.  Will be home-going with a burst of prednisone and doxycycline.  Discussed increasing the frequency of his albuterol at home.        Plan of Care: Normal progression of disease discussed.  All questions answered.  Explained symptomatic treatment.  Instruction provided in the use of fluids, vaporizer, acetaminophen, and other OTC medication for symptom control.  Extra fluids  Analgesics as needed, dose reviewed.  Follow up as needed should symptoms fail to improve.     Counseling: Homegoing. I discussed the differential, results and discharge plan with the patient and/or family/friend/caregiver  if present.  I emphasized the importance of follow-up with the physician I referred them to in the timeframe recommended.  I explained reasons for the patient to return to the Emergency Department. Additional verbal discharge instructions were also given and discussed with the patient to supplement those generated by the EMR. We also discussed medications that were prescribed (if any) including common side effects and interactions. The patient was advised to abstain from driving, operating heavy machinery or making significant decisions while taking medications such as opiates and muscle relaxers that may impair this. All questions were addressed.  They understand return precautions and discharge instructions. The patient and/or family/friend/caregiver expressed understanding.      Assessment      1. Acute upper respiratory infection      Plan   Discharge to home and advised to contact Ninfa Linden, MD  [address]  [place] [address]  [phone]      As needed   Patient condition is good    New Medications     New Prescriptions    DOXYCYCLINE HYCLATE (VIBRA-TABS) 100 MG TABLET    Take 1 tablet by mouth 2 times daily for 10 days May substitute for Doxycycline Monohydrate    PREDNISONE (DELTASONE) 10 MG TABLET    Take 4 tablets by mouth daily for 5 days     Electronically signed by Rickey Barbara, PA   DD: 03/31/21  **This report was transcribed using voice recognition software. Every effort was made to ensure accuracy; however, inadvertent computerized transcription errors may be present.  END OF ED PROVIDER NOTE          Delynn Flavin Tremeka Helbling, PA  03/31/21 0859       Delynn Flavin Tonyia Marschall, PA  03/31/21 [phone]

## 2021-05-02 ENCOUNTER — Other Ambulatory Visit (HOSPITAL_COMMUNITY): Payer: Self-pay

## 2021-05-02 DIAGNOSIS — R3914 Feeling of incomplete bladder emptying: Secondary | ICD-10-CM | POA: Diagnosis not present

## 2021-05-02 DIAGNOSIS — R350 Frequency of micturition: Secondary | ICD-10-CM | POA: Diagnosis not present

## 2021-05-02 MED ORDER — SILODOSIN 8 MG PO CAPS
8.0000 mg | ORAL_CAPSULE | ORAL | 3 refills | Status: AC
  Filled 2021-05-02: qty 45, 90d supply, fill #0

## 2021-05-03 ENCOUNTER — Other Ambulatory Visit (HOSPITAL_COMMUNITY): Payer: Self-pay

## 2021-05-24 ENCOUNTER — Ambulatory Visit: Payer: Medicare Other

## 2021-05-31 ENCOUNTER — Other Ambulatory Visit: Payer: Self-pay

## 2021-05-31 ENCOUNTER — Ambulatory Visit (INDEPENDENT_AMBULATORY_CARE_PROVIDER_SITE_OTHER): Payer: Medicare Other

## 2021-05-31 VITALS — BP 120/70 | HR 51 | Ht 69.0 in | Wt 186.0 lb

## 2021-05-31 DIAGNOSIS — Z0000 Encounter for general adult medical examination without abnormal findings: Secondary | ICD-10-CM

## 2021-05-31 DIAGNOSIS — Z1211 Encounter for screening for malignant neoplasm of colon: Secondary | ICD-10-CM

## 2021-05-31 NOTE — Patient Instructions (Signed)
Theodore Davis , Thank you for taking time to come for your Medicare Wellness Visit. I appreciate your ongoing commitment to your health goals. Please review the following plan we discussed and let me know if I can assist you in the future.   Screening recommendations/referrals: Colonoscopy: Order placed today.  Recommended yearly ophthalmology/optometry visit for glaucoma screening and checkup Recommended yearly dental visit for hygiene and checkup  Vaccinations: Influenza vaccine: Done 04/06/2021 Repeat annually  Pneumococcal vaccine: Done 10/18/2013 and 10/24/2014 Tdap vaccine: Done 10/17/2010 Shingles vaccine: Shingrix discussed. Please contact your pharmacy for coverage information.     Covid-19: Done 08/14/2019, 09/06/2019, 04/25/2020.  Advanced directives: Please bring a copy of your health care power of attorney and living will to the office to be added to your chart at your convenience.   Conditions/risks identified: KEEP UP THE GOOD WORK!!  Next appointment: Follow up in one year for your annual wellness visit. 2023.  Preventive Care 26 Years and Older, Male  Preventive care refers to lifestyle choices and visits with your health care provider that can promote health and wellness. What does preventive care include? A yearly physical exam. This is also called an annual well check. Dental exams once or twice a year. Routine eye exams. Ask your health care provider how often you should have your eyes checked. Personal lifestyle choices, including: Daily care of your teeth and gums. Regular physical activity. Eating a healthy diet. Avoiding tobacco and drug use. Limiting alcohol use. Practicing safe sex. Taking low doses of aspirin every day. Taking vitamin and mineral supplements as recommended by your health care provider. What happens during an annual well check? The services and screenings done by your health care provider during your annual well check will depend on your age,  overall health, lifestyle risk factors, and family history of disease. Counseling  Your health care provider may ask you questions about your: Alcohol use. Tobacco use. Drug use. Emotional well-being. Home and relationship well-being. Sexual activity. Eating habits. History of falls. Memory and ability to understand (cognition). Work and work Statistician. Screening  You may have the following tests or measurements: Height, weight, and BMI. Blood pressure. Lipid and cholesterol levels. These may be checked every 5 years, or more frequently if you are over 104 years old. Skin check. Lung cancer screening. You may have this screening every year starting at age 20 if you have a 30-pack-year history of smoking and currently smoke or have quit within the past 15 years. Fecal occult blood test (FOBT) of the stool. You may have this test every year starting at age 67. Flexible sigmoidoscopy or colonoscopy. You may have a sigmoidoscopy every 5 years or a colonoscopy every 10 years starting at age 60. Prostate cancer screening. Recommendations will vary depending on your family history and other risks. Hepatitis C blood test. Hepatitis B blood test. Sexually transmitted disease (STD) testing. Diabetes screening. This is done by checking your blood sugar (glucose) after you have not eaten for a while (fasting). You may have this done every 1-3 years. Abdominal aortic aneurysm (AAA) screening. You may need this if you are a current or former smoker. Osteoporosis. You may be screened starting at age 59 if you are at high risk. Talk with your health care provider about your test results, treatment options, and if necessary, the need for more tests. Vaccines  Your health care provider may recommend certain vaccines, such as: Influenza vaccine. This is recommended every year. Tetanus, diphtheria, and acellular pertussis (Tdap,  Td) vaccine. You may need a Td booster every 10 years. Zoster vaccine.  You may need this after age 25. Pneumococcal 13-valent conjugate (PCV13) vaccine. One dose is recommended after age 34. Pneumococcal polysaccharide (PPSV23) vaccine. One dose is recommended after age 63. Talk to your health care provider about which screenings and vaccines you need and how often you need them. This information is not intended to replace advice given to you by your health care provider. Make sure you discuss any questions you have with your health care provider. Document Released: 07/06/2015 Document Revised: 02/27/2016 Document Reviewed: 04/10/2015 Elsevier Interactive Patient Education  2017 [place] Prevention in the Home Falls can cause injuries. They can happen to people of all ages. There are many things you can do to make your home safe and to help prevent falls. What can I do on the outside of my home? Regularly fix the edges of walkways and driveways and fix any cracks. Remove anything that might make you trip as you walk through a door, such as a raised step or threshold. Trim any bushes or trees on the path to your home. Use bright outdoor lighting. Clear any walking paths of anything that might make someone trip, such as rocks or tools. Regularly check to see if handrails are loose or broken. Make sure that both sides of any steps have handrails. Any raised decks and porches should have guardrails on the edges. Have any leaves, snow, or ice cleared regularly. Use sand or salt on walking paths during winter. Clean up any spills in your garage right away. This includes oil or grease spills. What can I do in the bathroom? Use night lights. Install grab bars by the toilet and in the tub and shower. Do not use towel bars as grab bars. Use non-skid mats or decals in the tub or shower. If you need to sit down in the shower, use a plastic, non-slip stool. Keep the floor dry. Clean up any water that spills on the floor as soon as it happens. Remove soap  buildup in the tub or shower regularly. Attach bath mats securely with double-sided non-slip rug tape. Do not have throw rugs and other things on the floor that can make you trip. What can I do in the bedroom? Use night lights. Make sure that you have a light by your bed that is easy to reach. Do not use any sheets or blankets that are too big for your bed. They should not hang down onto the floor. Have a firm chair that has side arms. You can use this for support while you get dressed. Do not have throw rugs and other things on the floor that can make you trip. What can I do in the kitchen? Clean up any spills right away. Avoid walking on wet floors. Keep items that you use a lot in easy-to-reach places. If you need to reach something above you, use a strong step stool that has a grab bar. Keep electrical cords out of the way. Do not use floor polish or wax that makes floors slippery. If you must use wax, use non-skid floor wax. Do not have throw rugs and other things on the floor that can make you trip. What can I do with my stairs? Do not leave any items on the stairs. Make sure that there are handrails on both sides of the stairs and use them. Fix handrails that are broken or loose. Make sure that handrails are as  long as the stairways. Check any carpeting to make sure that it is firmly attached to the stairs. Fix any carpet that is loose or worn. Avoid having throw rugs at the top or bottom of the stairs. If you do have throw rugs, attach them to the floor with carpet tape. Make sure that you have a light switch at the top of the stairs and the bottom of the stairs. If you do not have them, ask someone to add them for you. What else can I do to help prevent falls? Wear shoes that: Do not have high heels. Have rubber bottoms. Are comfortable and fit you well. Are closed at the toe. Do not wear sandals. If you use a stepladder: Make sure that it is fully opened. Do not climb a closed  stepladder. Make sure that both sides of the stepladder are locked into place. Ask someone to hold it for you, if possible. Clearly mark and make sure that you can see: Any grab bars or handrails. First and last steps. Where the edge of each step is. Use tools that help you move around (mobility aids) if they are needed. These include: Canes. Walkers. Scooters. Crutches. Turn on the lights when you go into a dark area. Replace any light bulbs as soon as they burn out. Set up your furniture so you have a clear path. Avoid moving your furniture around. If any of your floors are uneven, fix them. If there are any pets around you, be aware of where they are. Review your medicines with your doctor. Some medicines can make you feel dizzy. This can increase your chance of falling. Ask your doctor what other things that you can do to help prevent falls. This information is not intended to replace advice given to you by your health care provider. Make sure you discuss any questions you have with your health care provider. Document Released: 04/05/2009 Document Revised: 11/15/2015 Document Reviewed: 07/14/2014 Elsevier Interactive Patient Education  2017 [employer].

## 2021-05-31 NOTE — Progress Notes (Signed)
Subjective:   Theodore Davis is a 75 y.o. male who presents for Medicare Annual/Subsequent preventive examination.  Review of Systems     Cardiac Risk Factors include: advanced age (>74men, >21 women);dyslipidemia;male gender     Objective:    Today's Vitals   05/31/21 0941  BP: 120/70  Pulse: (!) 51  SpO2: 100%  Weight: 186 lb (84.4 kg)  Height: 5\' 9"  (1.753 m)   Body mass index is 27.47 kg/m.  Advanced Directives 05/31/2021 05/18/2017 04/07/2017 11/30/2014 11/28/2014 05/28/2011 05/20/2011  Does Patient Have a Medical Advance Directive? Yes Yes Yes Yes Yes - Patient does not have advance directive;Patient would not like information  Type of Scientist, forensic Power of [place];Living will Living will [place];Living will [place];Living will [place];Living will - -  Does patient want to make changes to medical advance directive? - Yes (Inpatient - patient defers changing a medical advance directive at this time) - - - - -  Copy of [place] in Chart? No - copy requested - No - copy requested No - copy requested - - -  Would patient like information on creating a medical advance directive? - - No - Patient declined - - - -  Pre-existing out of facility DNR order (yellow form or pink MOST form) - - - - - No No    Current Medications (verified) Outpatient Encounter Medications as of 05/31/2021  Medication Sig   finasteride (PROSCAR) 5 MG tablet    magnesium oxide (MAG-OX) 400 MG tablet Take 400 mg by mouth daily.    silodosin (RAPAFLO) 8 MG CAPS capsule Take 1 capsule (8 mg total) by mouth every other day   simvastatin (ZOCOR) 40 MG tablet Take 1 tablet (40 mg total) by mouth at bedtime.   [DISCONTINUED] promethazine (PHENERGAN) 25 MG tablet Take 1 tablet (25 mg total) by mouth every 8 (eight) hours as needed for nausea or vomiting.   No facility-administered encounter medications on file as  of 05/31/2021.    Allergies (verified) Codeine and Lipitor [atorvastatin calcium]   History: Past Medical History:  Diagnosis Date   Arthritis 10/03/2010   right sternoclavicular joint   Heart murmur    Hemangioma of spine    lower lumbar   HOH (hard of hearing)    R ear   Hyperlipidemia    hypercholesterolemia   Lyme disease    Past Surgical History:  Procedure Laterality Date   CARDIOVASCULAR STRESS TEST  08/2010   CHOLECYSTECTOMY     HERNIA REPAIR     R inguinal   JOINT REPLACEMENT Right    MASTOID DEBRIDEMENT     R ear   RESECTION DISTAL CLAVICAL Left 11/30/2014   Procedure: RESECTION DISTAL CLAVICAL;  Surgeon: Kathryne Hitch, MD;  Location: [facility];  Service: Orthopedics;  Laterality: Left;   SHOULDER ARTHROSCOPY WITH BICEPSTENOTOMY Left 11/30/2014   Procedure: SHOULDER ARTHROSCOPY WITH BICEPSTENOTOMY;  Surgeon: Kathryne Hitch, MD;  Location: [facility];  Service: Orthopedics;  Laterality: Left;   SHOULDER ARTHROSCOPY WITH ROTATOR CUFF REPAIR AND SUBACROMIAL DECOMPRESSION Left 11/30/2014   Procedure: LEFT SHOULDER ARTHROSCOPY WITH DISTAL CLAVICAL EXCISION, ACROMIOPLASTY, ROTATOR CUFF REPAIR , BICEPS TENOTOMY;  Surgeon: Kathryne Hitch, MD;  Location: [facility];  Service: Orthopedics;  Laterality: Left;   TENDON REPAIR  2005   R elbow   TOTAL KNEE ARTHROPLASTY  05/28/2011   Procedure: TOTAL KNEE ARTHROPLASTY;  Surgeon: Quillian Quince  Dennie Bible, MD;  Location: [facility];  Service: Orthopedics;  Laterality: Right;  Right total knee arthroplasty    US ECHOCARDIOGRAPHY  08/2010   Family History  Problem Relation Age of Onset   Heart disease Mother    Atrial fibrillation Mother    Heart disease Father    Rheum arthritis Father    Bladder Cancer Father    Atrial fibrillation Father    Brain cancer Maternal Uncle    Lung disease Neg Hx    Social History   Socioeconomic History   Marital status: Married    Spouse name: Not on file   Number of  children: 2   Years of education: Not on file   Highest education level: Not on file  Occupational History   Not on file  Tobacco Use   Smoking status: Former    Packs/day: 0.25    Years: 50.00    Pack years: 12.50    Types: Cigarettes    Quit date: 06/23/1997    Years since quitting: 23.9   Smokeless tobacco: Former  Scientific laboratory technician Use: Never used  Substance and Sexual Activity   Alcohol use: Yes    Comment: occasional beer   Drug use: No   Sexual activity: Yes  Other Topics Concern   Not on file  Social History Narrative   [facility] (04/08/17):   Originally from [facility]. Has lived in [place] as well. Previously worked for the Education officer, environmental as a Dealer. Has 2 dogs & a rabbit currently. No bird exposure. He reports black mold was found in his air conditioner at [facility] when it was changed out 2 years ago. No hot tub exposure. Enjoys fishing.    Married since 1974.    2 sons   3 grandchildren.   Social Determinants of Health   Financial Resource Strain: Low Risk    Difficulty of Paying Living Expenses: Not hard at all  Food Insecurity: No Food Insecurity   Worried About Charity fundraiser in the Last Year: Never true   [place] in the Last Year: Never true  Transportation Needs: No Transportation Needs   Lack of Transportation (Medical): No   Lack of Transportation (Non-Medical): No  Physical Activity: Sufficiently Active   Days of Exercise per Week: 5 days   Minutes of Exercise per Session: 30 min  Stress: No Stress Concern Present   Feeling of Stress : Not at all  Social Connections: Socially Integrated   Frequency of Communication with Friends and Family: More than three times a week   Frequency of Social Gatherings with Friends and Family: More than three times a week   Attends Religious Services: More than 4 times per year   Active Member of [employer] or Organizations: Yes   Attends Music therapist: More than 4 times per year    Marital Status: Married    Tobacco Counseling Counseling given: Not Answered   Clinical Intake:  Pre-visit preparation completed: Yes  Pain : No/denies pain     BMI - recorded: 27.47 Nutritional Status: BMI 25 -29 Overweight Nutritional Risks: None Diabetes: No  How often do you need to have someone help you when you read instructions, pamphlets, or other written materials from your doctor or pharmacy?: 1 - Never  Diabetic?no  Interpreter Needed?: No  Information entered by :: MJ Abeera Flannery, LPN   Activities of Daily Living In your present state of health, do you have any difficulty  performing the following activities: 05/31/2021  Hearing? Y  Vision? N  Difficulty concentrating or making decisions? Y  Walking or climbing stairs? N  Dressing or bathing? N  Doing errands, shopping? N  Preparing Food and eating ? N  Using the Toilet? N  In the past six months, have you accidently leaked urine? N  Do you have problems with loss of bowel control? N  Managing your Medications? N  Managing your Finances? N  Housekeeping or managing your Housekeeping? N  Some recent data might be hidden    Patient Care Team: Susy Frizzle, MD as PCP - General (Family Medicine) Troy Sine, MD as PCP - Cardiology (Cardiology) Susy Frizzle, MD (Family Medicine)  Indicate any recent Medical Services you may have received from other than [facility] providers in the past year (date may be approximate).     Assessment:   This is a routine wellness examination for Ananda.  Hearing/Vision screen Hearing Screening - Comments:: Hearing aids, both ears Vision Screening - Comments:: Glasses. Dr. Gershon Crane. 2022.  Dietary issues and exercise activities discussed: Current Exercise Habits: Home exercise routine, Type of exercise: walking, Time (Minutes): 30, Frequency (Times/Week): 5, Weekly Exercise (Minutes/Week): 150, Intensity: Mild, Exercise limited by: cardiac condition(s)   Goals  Addressed             This Visit's Progress    Exercise 3x per week (30 min per time)   On track      Depression Screen PHQ 2/9 Scores 05/31/2021 07/24/2020 05/30/2020 09/21/2019 01/13/2018 12/29/2016 11/10/2016  PHQ - 2 Score 0 0 0 0 0 0 0  PHQ- 9 Score - - - 0 0 - 6    Fall Risk Fall Risk  05/31/2021 07/24/2020 05/30/2020 01/13/2018 12/29/2016  Falls in the past year? 0 0 0 No No  Number falls in past yr: 0 0 0 - -  Injury with Fall? 0 0 0 - -  Risk for fall due to : No Fall Risks - - - -  Follow up Falls prevention discussed Falls evaluation completed Falls evaluation completed - -    FALL RISK PREVENTION PERTAINING TO THE HOME:  Any stairs in or around the home? Yes  If so, are there any without handrails? No  Home free of loose throw rugs in walkways, pet beds, electrical cords, etc? Yes  Adequate lighting in your home to reduce risk of falls? Yes   ASSISTIVE DEVICES UTILIZED TO PREVENT FALLS:  Life alert? No  Use of a cane, walker or w/c? No  Grab bars in the bathroom? No  Shower chair or bench in shower? Yes  Elevated toilet seat or a handicapped toilet? Yes   TIMED UP AND GO:  Was the test performed? Yes .  Length of time to ambulate 10 feet: 10 sec.   Gait steady and fast without use of assistive device  Cognitive Function:     6CIT Screen 05/31/2021  What Year? 0 points  What month? 0 points  What time? 0 points  Count back from 20 0 points  Months in reverse 0 points  Repeat phrase 2 points  Total Score 2    Immunizations Immunization History  Administered Date(s) Administered   Influenza, High Dose Seasonal PF 04/04/2017   PFIZER(Purple Top)SARS-COV-2 Vaccination 08/14/2019, 09/06/2019, 04/25/2020   Pneumococcal Conjugate-13 10/18/2013   Pneumococcal Polysaccharide-23 10/24/2014   Td 10/17/2010   Tdap 10/17/2010    TDAP status: Due, Education has been provided regarding the importance  of this vaccine. Advised may receive this vaccine at local pharmacy  or Health Dept. Aware to provide a copy of the vaccination record if obtained from local pharmacy or Health Dept. Verbalized acceptance and understanding.  Flu Vaccine status: Up to date  Pneumococcal vaccine status: Up to date  Covid-19 vaccine status: Completed vaccines  Qualifies for Shingles Vaccine? Yes   Zostavax completed No   Shingrix Completed?: No.    Education has been provided regarding the importance of this vaccine. Patient has been advised to call insurance company to determine out of pocket expense if they have not yet received this vaccine. Advised may also receive vaccine at local pharmacy or Health Dept. Verbalized acceptance and understanding.  Screening Tests Health Maintenance  Topic Date Due   Zoster Vaccines- Shingrix (1 of 2) Never done   COLONOSCOPY (Pts 45-14yrs Insurance coverage will need to be confirmed)  06/23/2018   COVID-19 Vaccine (4 - Booster for Pfizer series) 06/20/2020   TETANUS/TDAP  10/16/2020   INFLUENZA VACCINE  01/21/2021   Pneumonia Vaccine 16+ Years old  Completed   Hepatitis C Screening  Completed   HPV VACCINES  Aged Out    Health Maintenance  Health Maintenance Due  Topic Date Due   Zoster Vaccines- Shingrix (1 of 2) Never done   COLONOSCOPY (Pts 45-76yrs Insurance coverage will need to be confirmed)  06/23/2018   COVID-19 Vaccine (4 - Booster for Pfizer series) 06/20/2020   TETANUS/TDAP  10/16/2020   INFLUENZA VACCINE  01/21/2021    Colorectal cancer screening: Type of screening: Colonoscopy. Completed 06/23/2008. Repeat every 10 years ORDER PLACED TODAY. Lung Cancer Screening: (Low Dose CT Chest recommended if Age 82-80 years, 30 pack-year currently smoking OR have quit w/in 15years.) does not qualify.  NON SMOKER  Additional Screening:  Hepatitis C Screening: does qualify; Completed 01/13/2018  Vision Screening: Recommended annual ophthalmology exams for early detection of glaucoma and other disorders of the eye. Is the  patient up to date with their annual eye exam?  Yes  Who is the provider or what is the name of the office in which the patient attends annual eye exams? Dr. Gershon Crane. If pt is not established with a provider, would they like to be referred to a provider to establish care? No .   Dental Screening: Recommended annual dental exams for proper oral hygiene  Community Resource Referral / Chronic Care Management: CRR required this visit?  No   CCM required this visit?  No      Plan:     I have personally reviewed and noted the following in the patient's chart:   Medical and social history Use of alcohol, tobacco or illicit drugs  Current medications and supplements including opioid prescriptions. Patient is not currently taking opioid prescriptions. Functional ability and status Nutritional status Physical activity Advanced directives List of other physicians Hospitalizations, surgeries, and ER visits in previous 12 months Vitals Screenings to include cognitive, depression, and falls Referrals and appointments  In addition, I have reviewed and discussed with patient certain preventive protocols, quality metrics, and best practice recommendations. A written personalized care plan for preventive services as well as general preventive health recommendations were provided to patient.     Chriss Driver, LPN   [id number]/[id number]   Nurse Notes: IN PERSON VISIT AT [facility]. Order placed for Colonoscopy, due 2020 and pt aware. Discussed Shingrix and how to obtain.

## 2021-06-03 ENCOUNTER — Encounter (INDEPENDENT_AMBULATORY_CARE_PROVIDER_SITE_OTHER): Payer: Self-pay | Admitting: *Deleted

## 2021-06-25 ENCOUNTER — Ambulatory Visit: Payer: Medicare Other | Admitting: Podiatry

## 2021-07-18 ENCOUNTER — Emergency Department (HOSPITAL_BASED_OUTPATIENT_CLINIC_OR_DEPARTMENT_OTHER)
Admission: EM | Admit: 2021-07-18 | Discharge: 2021-07-18 | Disposition: A | Discharge status: Home / Self Care | Payer: Medicare Other | Attending: Emergency Medicine | Admitting: Emergency Medicine

## 2021-07-18 ENCOUNTER — Other Ambulatory Visit: Payer: Self-pay

## 2021-07-18 ENCOUNTER — Other Ambulatory Visit (HOSPITAL_COMMUNITY): Payer: Self-pay

## 2021-07-18 ENCOUNTER — Encounter (HOSPITAL_BASED_OUTPATIENT_CLINIC_OR_DEPARTMENT_OTHER): Payer: Self-pay

## 2021-07-18 DIAGNOSIS — R059 Cough, unspecified: Secondary | ICD-10-CM | POA: Diagnosis present

## 2021-07-18 DIAGNOSIS — Z79899 Other long term (current) drug therapy: Secondary | ICD-10-CM | POA: Diagnosis not present

## 2021-07-18 DIAGNOSIS — U071 COVID-19: Secondary | ICD-10-CM | POA: Insufficient documentation

## 2021-07-18 LAB — RESP PANEL BY RT-PCR (FLU A&B, COVID) ARPGX2
Influenza A by PCR: NEGATIVE
Influenza B by PCR: NEGATIVE
SARS Coronavirus 2 by RT PCR: POSITIVE — AB

## 2021-07-18 MED ORDER — MOLNUPIRAVIR EUA 200MG CAPSULE
4.0000 | ORAL_CAPSULE | Freq: Two times a day (BID) | ORAL | 0 refills | Status: AC
  Filled 2021-07-18: qty 40, 5d supply, fill #0

## 2021-07-18 NOTE — ED Triage Notes (Addendum)
Patient here POV from Home for COVID-19 Test.  Patient recently exposed to Same and is now having Congested Cough and Congestion.  No N/V/D. No Diarrhea.   NAD Noted during Triage. A&Ox4. GCS 15. Ambulatory.

## 2021-07-18 NOTE — ED Provider Notes (Signed)
[facility] EMERGENCY DEPT Provider Note   CSN: [id number] Arrival date & time: 07/18/21  1409     History  Chief Complaint  Patient presents with   Cough    Theodore Davis is a 76 y.o. male.  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 No significant fatigue, eating ok Has not had recent labs    Past Medical History:  Diagnosis Date   Arthritis 10/03/2010   right sternoclavicular joint   Heart murmur    Hemangioma of spine    lower lumbar   HOH (hard of hearing)    R ear   Hyperlipidemia    hypercholesterolemia   Lyme disease      Home Medications Prior to Admission medications   Medication Sig Start Date End Date Taking? Authorizing Provider  molnupiravir EUA (LAGEVRIO) 200 mg CAPS capsule Take 4 capsules (800 mg total) by mouth 2 (two) times daily for 5 days. 07/18/21 07/24/21 Yes Gareth Morgan, MD  finasteride (PROSCAR) 5 MG tablet  04/27/20   [provider]  magnesium oxide (MAG-OX) 400 MG tablet Take 400 mg by mouth daily.     [provider]  silodosin (RAPAFLO) 8 MG CAPS capsule Take 1 capsule (8 mg total) by mouth every other day 05/02/21     simvastatin (ZOCOR) 40 MG tablet Take 1 tablet (40 mg total) by mouth at bedtime. 01/18/21   Troy Sine, MD  promethazine (PHENERGAN) 25 MG tablet Take 1 tablet (25 mg total) by mouth every 8 (eight) hours as needed for nausea or vomiting. 08/21/20 09/13/20  Susy Frizzle, MD      Allergies    Codeine and Lipitor [atorvastatin calcium]    Review of Systems   Review of Systems See above  Physical Exam Updated Vital Signs BP 129/72    Pulse 65    Temp 98.2 F (36.8 C)    Resp 18    Ht 5\' 9"  (1.753 m)    Wt 84.4 kg    SpO2 100%    BMI 27.48 kg/m  Physical  Exam Vitals and nursing note reviewed.  Constitutional:      General: He is not in acute distress.    Appearance: He is well-developed. He is not diaphoretic.  HENT:     Head: Normocephalic and atraumatic.  Eyes:     Conjunctiva/sclera: Conjunctivae normal.  Cardiovascular:     Rate and Rhythm: Normal rate and regular rhythm.     Heart sounds: Normal heart sounds. No murmur heard.   No friction rub. No gallop.  Pulmonary:     Effort: Pulmonary effort is normal. No respiratory distress.     Breath sounds: Normal breath sounds. No wheezing or rales.  Abdominal:     General: There is no distension.     Palpations: Abdomen is soft.     Tenderness: There is no abdominal tenderness. There is no guarding.  Musculoskeletal:     Cervical back: Normal range of motion.  Skin:    General: Skin is warm and dry.  Neurological:     Mental Status: He is alert and oriented to person, place, and time.    ED Results / Procedures / Treatments   Labs (all labs ordered are listed, but only  abnormal results are displayed) Labs Reviewed  RESP PANEL BY RT-PCR (FLU A&B, COVID) ARPGX2 - Abnormal; Notable for the following components:      Result Value   SARS Coronavirus 2 by RT PCR POSITIVE (*)    All other components within normal limits    EKG None  Radiology No results found.  Procedures Procedures    Medications Ordered in ED Medications - No data to display  ED Course/ Medical Decision Making/ A&P                           Medical Decision Making  76yo male with history of hypertension presents with concern for congestion for 2 days, exposure to COVID 19.  Hemodynamically stable, clear breath sounds bilaterally, no n/v/d/fatigue. Do not suspect pneumonia, significant electrolyte abnormality or anemia.  Considered obtaining labs and possibility of paxlovid however given mild symptoms feel it is reasonable to forgo labwork at this time.    Symptoms secondary to COVID 19.  Given rx  for molnupiravir.  Recommend continued supportive care, tylenol, ibuprofen. Patient discharged in stable condition with understanding of reasons to return.         Final Clinical Impression(s) / ED Diagnoses Final diagnoses:  [id number]    Rx / DC Orders ED Discharge Orders          Ordered    molnupiravir EUA (LAGEVRIO) 200 mg CAPS capsule  2 times daily        07/18/21 1738              Gareth Morgan, MD 07/20/21 1136

## 2021-07-19 ENCOUNTER — Other Ambulatory Visit (HOSPITAL_COMMUNITY): Payer: Self-pay

## 2021-08-09 ENCOUNTER — Other Ambulatory Visit (INDEPENDENT_AMBULATORY_CARE_PROVIDER_SITE_OTHER): Payer: Self-pay

## 2021-08-09 ENCOUNTER — Telehealth (INDEPENDENT_AMBULATORY_CARE_PROVIDER_SITE_OTHER): Payer: Self-pay

## 2021-08-09 ENCOUNTER — Encounter (INDEPENDENT_AMBULATORY_CARE_PROVIDER_SITE_OTHER): Payer: Self-pay

## 2021-08-09 DIAGNOSIS — Z1211 Encounter for screening for malignant neoplasm of colon: Secondary | ICD-10-CM

## 2021-08-09 MED ORDER — PEG 3350-KCL-NA BICARB-NACL 420 G PO SOLR: 4000.0000 mL | ORAL | 0 refills | Status: DC

## 2021-08-09 NOTE — Telephone Encounter (Signed)
Referring MD/PCP: Pickard  Procedure: tcs  Reason/Indication:  screening  Has patient had this procedure before?  yes  If so, when, by whom and where?  10 yrs ago  Is there a family history of colon cancer?  no  Who?  What age when diagnosed?    Is patient diabetic? If yes, Type 1 or Type 2   no      Does patient have prosthetic heart valve or mechanical valve?  no  Do you have a pacemaker/defibrillator?  no  Has patient ever had endocarditis/atrial fibrillation? no  Does patient use oxygen? no  Has patient had joint replacement within last 12 months?  no  Is patient constipated or do they take laxatives? no  Does patient have a history of alcohol/drug use?  no  Have you had a stroke/heart attack last 6 mths? no  Do you take medicine for weight loss?  no  For male patients,: have you had a hysterectomy N/A                      are you post menopausal N/A                      do you still have your menstrual cycle N/A  Is patient on blood thinner such as Coumadin, Plavix and/or Aspirin? no  Medications: simvastatin 40 mg daily, silodosin 8 mg, magnesium daily  Allergies: nkda  Medication Adjustment per Dr Jenetta Downer none  Procedure date & time: 08/28/21 at 1045

## 2021-08-09 NOTE — Telephone Encounter (Signed)
Theodore Davis Ann Ricke Kimoto, CMA  ?

## 2021-08-09 NOTE — Telephone Encounter (Signed)
Ok to schedule.  Thanks,  [name], MD Gastroenterology and Hepatology [facility]  

## 2021-08-12 ENCOUNTER — Encounter (INDEPENDENT_AMBULATORY_CARE_PROVIDER_SITE_OTHER): Payer: Self-pay

## 2021-08-13 ENCOUNTER — Other Ambulatory Visit (INDEPENDENT_AMBULATORY_CARE_PROVIDER_SITE_OTHER): Payer: Self-pay

## 2021-08-22 ENCOUNTER — Encounter (HOSPITAL_COMMUNITY): Payer: Self-pay

## 2021-08-22 ENCOUNTER — Encounter (HOSPITAL_COMMUNITY)
Admission: RE | Admit: 2021-08-22 | Discharge: 2021-08-22 | Disposition: A | Discharge status: Home / Self Care | Payer: Medicare Other | Source: Ambulatory Visit | Attending: Gastroenterology | Admitting: Gastroenterology

## 2021-08-22 ENCOUNTER — Other Ambulatory Visit: Payer: Self-pay

## 2021-08-28 ENCOUNTER — Encounter (INDEPENDENT_AMBULATORY_CARE_PROVIDER_SITE_OTHER): Payer: Self-pay | Admitting: *Deleted

## 2021-08-28 ENCOUNTER — Ambulatory Visit (HOSPITAL_COMMUNITY)
Admission: RE | Admit: 2021-08-28 | Discharge: 2021-08-28 | Disposition: A | Discharge status: Home / Self Care | Payer: Medicare Other | Attending: Gastroenterology | Admitting: Gastroenterology

## 2021-08-28 ENCOUNTER — Encounter (HOSPITAL_COMMUNITY): Payer: Self-pay | Admitting: Gastroenterology

## 2021-08-28 ENCOUNTER — Ambulatory Visit (HOSPITAL_BASED_OUTPATIENT_CLINIC_OR_DEPARTMENT_OTHER): Payer: Medicare Other | Admitting: Certified Registered Nurse Anesthetist

## 2021-08-28 ENCOUNTER — Encounter (HOSPITAL_COMMUNITY)
Admission: RE | Disposition: A | Discharge status: Home / Self Care | Payer: Self-pay | Source: Home / Self Care | Attending: Gastroenterology

## 2021-08-28 ENCOUNTER — Ambulatory Visit (HOSPITAL_COMMUNITY): Payer: Medicare Other | Admitting: Certified Registered Nurse Anesthetist

## 2021-08-28 DIAGNOSIS — K635 Polyp of colon: Secondary | ICD-10-CM | POA: Diagnosis not present

## 2021-08-28 DIAGNOSIS — K621 Rectal polyp: Secondary | ICD-10-CM | POA: Diagnosis not present

## 2021-08-28 DIAGNOSIS — E785 Hyperlipidemia, unspecified: Secondary | ICD-10-CM | POA: Diagnosis not present

## 2021-08-28 DIAGNOSIS — D123 Benign neoplasm of transverse colon: Secondary | ICD-10-CM | POA: Diagnosis not present

## 2021-08-28 DIAGNOSIS — Z1211 Encounter for screening for malignant neoplasm of colon: Secondary | ICD-10-CM

## 2021-08-28 DIAGNOSIS — D125 Benign neoplasm of sigmoid colon: Secondary | ICD-10-CM | POA: Insufficient documentation

## 2021-08-28 DIAGNOSIS — Z87891 Personal history of nicotine dependence: Secondary | ICD-10-CM | POA: Diagnosis not present

## 2021-08-28 DIAGNOSIS — K629 Disease of anus and rectum, unspecified: Secondary | ICD-10-CM

## 2021-08-28 DIAGNOSIS — K6289 Other specified diseases of anus and rectum: Secondary | ICD-10-CM | POA: Diagnosis not present

## 2021-08-28 HISTORY — PX: POLYPECTOMY: SHX5525

## 2021-08-28 HISTORY — PX: COLONOSCOPY WITH PROPOFOL: SHX5780

## 2021-08-28 HISTORY — PX: BIOPSY: SHX5522

## 2021-08-28 LAB — HM COLONOSCOPY

## 2021-08-28 SURGERY — COLONOSCOPY WITH PROPOFOL
Anesthesia: General

## 2021-08-28 MED ORDER — PROPOFOL 500 MG/50ML IV EMUL
INTRAVENOUS | Status: DC | PRN
  Administered 2021-08-28: 150 ug/kg/min via INTRAVENOUS
  Administered 2021-08-28: 125 ug/kg/min via INTRAVENOUS

## 2021-08-28 MED ORDER — PHENYLEPHRINE 40 MCG/ML (10ML) SYRINGE FOR IV PUSH (FOR BLOOD PRESSURE SUPPORT)
PREFILLED_SYRINGE | INTRAVENOUS | Status: DC | PRN
  Administered 2021-08-28 (×2): 80 ug via INTRAVENOUS
  Administered 2021-08-28: 120 ug via INTRAVENOUS

## 2021-08-28 MED ORDER — LIDOCAINE HCL (CARDIAC) PF 100 MG/5ML IV SOSY
PREFILLED_SYRINGE | INTRAVENOUS | Status: DC | PRN
  Administered 2021-08-28: 50 mg via INTRAVENOUS

## 2021-08-28 MED ORDER — PROPOFOL 10 MG/ML IV BOLUS
INTRAVENOUS | Status: DC | PRN
  Administered 2021-08-28: 100 mg via INTRAVENOUS

## 2021-08-28 MED ORDER — LACTATED RINGERS IV SOLN: INTRAVENOUS | Status: DC

## 2021-08-28 NOTE — Transfer of Care (Signed)
Immediate Anesthesia Transfer of Care Note ? ?Patient: Theodore Davis ? ?Procedure(s) Performed: COLONOSCOPY WITH PROPOFOL ?POLYPECTOMY ?BIOPSY ? ?Patient Location: Short Stay ? ?Anesthesia Type:MAC ? ?Level of Consciousness: sedated, patient cooperative and responds to stimulation ? ?Airway & Oxygen Therapy: Patient Spontanous Breathing ? ?Post-op Assessment: Report given to RN, Post -op Vital signs reviewed and stable and Patient moving all extremities ? ?Post vital signs: Reviewed and stable ? ?Last Vitals:  ?Vitals Value Taken Time  ?BP    ?Temp    ?Pulse    ?Resp    ?SpO2    ? ? ?Last Pain:  ?Vitals:  ? 08/28/21 1105  ?TempSrc:   ?PainSc: 0-No pain  ?   ? ?Patients Stated Pain Goal: 6 (08/28/21 7989) ? ?Complications: No notable events documented. ?

## 2021-08-28 NOTE — H&P (Signed)
Theodore Davis is an 76 y.o. male.   ?Chief Complaint: Screening colonoscopy ?HPI: 76 year old male with past medical history of arthritis, hyperlipidemia and Lyme disease, coming for screening colonoscopy.  Reports his last colonoscopy was performed 10 years ago and was unremarkable, no reports are available.  The patient denies having any complaints such as melena, hematochezia, abdominal pain or distention, change in her bowel movement consistency or frequency, no changes in her weight recently.  No family history of colorectal cancer. ? ? ?Past Medical History:  ?Diagnosis Date  ? Arthritis 10/03/2010  ? right sternoclavicular joint  ? Heart murmur   ? Hemangioma of spine   ? lower lumbar  ? HOH (hard of hearing)   ? R ear  ? Hyperlipidemia   ? hypercholesterolemia  ? Lyme disease   ? ? ?Past Surgical History:  ?Procedure Laterality Date  ? CARDIOVASCULAR STRESS TEST  08/2010  ? CHOLECYSTECTOMY    ? HERNIA REPAIR    ? R inguinal  ? JOINT REPLACEMENT Right   ? MASTOID DEBRIDEMENT    ? R ear  ? RESECTION DISTAL CLAVICAL Left 11/30/2014  ? Procedure: RESECTION DISTAL CLAVICAL;  Surgeon: Kathryne Hitch, MD;  Location: [facility];  Service: Orthopedics;  Laterality: Left;  ? SHOULDER ARTHROSCOPY WITH BICEPSTENOTOMY Left 11/30/2014  ? Procedure: SHOULDER ARTHROSCOPY WITH BICEPSTENOTOMY;  Surgeon: Kathryne Hitch, MD;  Location: [place];  Service: Orthopedics;  Laterality: Left;  ? SHOULDER ARTHROSCOPY WITH ROTATOR CUFF REPAIR AND SUBACROMIAL DECOMPRESSION Left 11/30/2014  ? Procedure: LEFT SHOULDER ARTHROSCOPY WITH DISTAL CLAVICAL EXCISION, ACROMIOPLASTY, ROTATOR CUFF REPAIR , BICEPS TENOTOMY;  Surgeon: Kathryne Hitch, MD;  Location: [place];  Service: Orthopedics;  Laterality: Left;  ? TENDON REPAIR  2005  ? R elbow  ? TOTAL KNEE ARTHROPLASTY  05/28/2011  ? Procedure: TOTAL KNEE ARTHROPLASTY;  Surgeon: Ninetta Lights, MD;  Location: [place];  Service: Orthopedics;  Laterality:  Right;  Right total knee arthroplasty   ? US ECHOCARDIOGRAPHY  08/2010  ? ? ?Family History  ?Problem Relation Age of Onset  ? Heart disease Mother   ? Atrial fibrillation Mother   ? Heart disease Father   ? Rheum arthritis Father   ? Bladder Cancer Father   ? Atrial fibrillation Father   ? Brain cancer Maternal Uncle   ? Lung disease Neg Hx   ? ?Social History:  reports that he quit smoking about 24 years ago. His smoking use included cigarettes. He has a 12.50 pack-year smoking history. He has quit using smokeless tobacco. He reports current alcohol use. He reports that he does not use drugs. ? ?Allergies:  ?Allergies  ?Allergen Reactions  ? Codeine Nausea And Vomiting  ? Lipitor [Atorvastatin Calcium] Other (See Comments)  ?  myalgias  ? ? ?Medications Prior to Admission  ?Medication Sig Dispense Refill  ? Magnesium Oxide 200 MG TABS Take 400 mg by mouth at bedtime.    ? polyethylene glycol-electrolytes (TRILYTE) 420 g solution Take 4,000 mLs by mouth as directed. 4000 mL 0  ? silodosin (RAPAFLO) 8 MG CAPS capsule Take 1 capsule (8 mg total) by mouth every other day 45 capsule 3  ? simvastatin (ZOCOR) 40 MG tablet Take 1 tablet (40 mg total) by mouth at bedtime. 90 tablet 3  ? ? ?No results found for this or any previous visit (from the past 48 hour(s)). ?No results found. ? ?Review of Systems  ?Constitutional: Negative.   ?HENT: Negative.    ?  Eyes: Negative.   ?Respiratory: Negative.    ?Cardiovascular: Negative.   ?Gastrointestinal: Negative.   ?Endocrine: Negative.   ?Genitourinary: Negative.   ?Musculoskeletal: Negative.   ?Skin: Negative.   ?Allergic/Immunologic: Negative.   ?Neurological: Negative.   ?Hematological: Negative.   ?Psychiatric/Behavioral: Negative.    ? ?Blood pressure (!) 146/85, pulse 76, temperature 97.9 ?F (36.6 ?C), temperature source Oral, resp. rate 18, SpO2 96 %. ?Physical Exam  ?GENERAL: The patient is AO x3, in no acute distress. ?HEENT: Head is normocephalic and atraumatic. EOMI are  intact. Mouth is well hydrated and without lesions. ?NECK: Supple. No masses ?LUNGS: Clear to auscultation. No presence of rhonchi/wheezing/rales. Adequate chest expansion ?HEART: RRR, normal s1 and s2. ?ABDOMEN: Soft, nontender, no guarding, no peritoneal signs, and nondistended. BS +. No masses. ?EXTREMITIES: Without any cyanosis, clubbing, rash, lesions or edema. ?NEUROLOGIC: AOx3, no focal motor deficit. ?SKIN: no jaundice, no rashes ? ?Assessment/Plan ?76 year old male with past medical history of arthritis, hyperlipidemia and Lyme disease, coming for screening colonoscopy.  The patient is at average risk for colorectal cancer.  We will proceed with colonoscopy today. ? ? ?Harvel Quale, MD ?08/28/2021, 10:04 AM ? ? ? ?

## 2021-08-28 NOTE — Anesthesia Postprocedure Evaluation (Signed)
Anesthesia Post Note ? ?Patient: [name] ? ?Procedure(s) Performed: COLONOSCOPY WITH PROPOFOL ?POLYPECTOMY ?BIOPSY ? ?Patient location during evaluation: Phase II ?Anesthesia Type: General ?Level of consciousness: awake ?Pain management: pain level controlled ?Vital Signs Assessment: post-procedure vital signs reviewed and stable ?Respiratory status: spontaneous breathing and respiratory function stable ?Cardiovascular status: blood pressure returned to baseline and stable ?Postop Assessment: no headache and no apparent nausea or vomiting ?Anesthetic complications: no ?Comments: Late entry ? ? ?No notable events documented. ? ? ?Last Vitals:  ?Vitals:  ? 08/28/21 0922 08/28/21 1202  ?BP: (!) 146/85 (!) 96/53  ?Pulse: 76 64  ?Resp: 18 17  ?Temp: 36.6 ?C 37.1 ?C  ?SpO2: 96% 100%  ?  ?Last Pain:  ?Vitals:  ? 08/28/21 1202  ?TempSrc: Oral  ?PainSc:   ? ? ?  ?  ?  ?  ?  ?  ? ?Louann Sjogren ? ? ? ? ?

## 2021-08-28 NOTE — Discharge Instructions (Signed)
You are being discharged to home.  Resume your previous diet.  We are waiting for your pathology results.  Your physician has recommended a repeat colonoscopy for surveillance based on pathology results.  

## 2021-08-28 NOTE — Anesthesia Preprocedure Evaluation (Signed)
Anesthesia Evaluation  ?Patient identified by MRN, date of birth, ID band ?Patient awake ? ? ? ?Reviewed: ?Allergy & Precautions, H&P , NPO status , Patient's Chart, lab work & pertinent test results, reviewed documented beta blocker date and time  ? ?Airway ?Mallampati: II ? ?TM Distance: >3 FB ?Neck ROM: full ? ? ? Dental ?no notable dental hx. ? ?  ?Pulmonary ?neg pulmonary ROS, former smoker,  ?  ?Pulmonary exam normal ?breath sounds clear to auscultation ? ? ? ? ? ? Cardiovascular ?Exercise Tolerance: Good ?negative cardio ROS ? ? ?Rhythm:regular Rate:Normal ? ? ?  ?Neuro/Psych ?negative neurological ROS ? negative psych ROS  ? GI/Hepatic ?negative GI ROS, Neg liver ROS,   ?Endo/Other  ?negative endocrine ROS ? Renal/GU ?negative Renal ROS  ?negative genitourinary ?  ?Musculoskeletal ? ? Abdominal ?  ?Peds ? Hematology ?negative hematology ROS ?(+)   ?Anesthesia Other Findings ?1. Left ventricular ejection fraction, by estimation, is 65 to 70%. Left  ?ventricular ejection fraction by 3D volume is 70 %. The left ventricle has  ?normal function. The left ventricle has no regional wall motion  ?abnormalities. Left ventricular diastolic  ??parameters are consistent with Grade I diastolic dysfunction (impaired  ?relaxation).  ??2. Right ventricular systolic function is normal. The right ventricular  ?size is normal. There is normal pulmonary artery systolic pressure.  ??3. The mitral valve is normal in structure. Trivial mitral valve  ?regurgitation. No evidence of mitral stenosis.  ??4. The aortic valve is tricuspid. There is mild thickening of the aortic  ?valve. Aortic valve regurgitation is not visualized. Mild aortic valve  ?sclerosis is present, with no evidence of aortic valve stenosis.  ? Reproductive/Obstetrics ?negative OB ROS ? ?  ? ? ? ? ? ? ? ? ? ? ? ? ? ?  ?  ? ? ? ? ? ? ? ? ?Anesthesia Physical ?Anesthesia Plan ? ?ASA: 2 ? ?Anesthesia Plan: General  ? ?Post-op Pain  Management:   ? ?Induction:  ? ?PONV Risk Score and Plan: Propofol infusion ? ?Airway Management Planned:  ? ?Additional Equipment:  ? ?Intra-op Plan:  ? ?Post-operative Plan:  ? ?Informed Consent: I have reviewed the patients History and Physical, chart, labs and discussed the procedure including the risks, benefits and alternatives for the proposed anesthesia with the patient or authorized representative who has indicated his/her understanding and acceptance.  ? ? ? ?Dental Advisory Given ? ?Plan Discussed with: CRNA ? ?Anesthesia Plan Comments:   ? ? ? ? ? ? ?Anesthesia Quick Evaluation ? ?

## 2021-08-28 NOTE — Op Note (Signed)
[facility] ?Patient Name: Theodore Davis ?Procedure Date: 08/28/2021 10:57 AM ?MRN: [id number] ?Date of Birth: [birth year] ?Attending MD: Maylon Peppers ,  ?CSN: [id number] ?Age: 76 ?Admit Type: Outpatient ?Procedure:                Colonoscopy ?Indications:              Screening for colorectal malignant neoplasm ?Providers:                Maylon Peppers, Hughie Closs RN, RN, Caprice Kluver,  ?                          Raphael Gibney, Technician ?Referring MD:              ?Medicines:                Monitored Anesthesia Care ?Complications:            No immediate complications. ?Estimated Blood Loss:     Estimated blood loss: none. ?Procedure:                Pre-Anesthesia Assessment: ?                          - Prior to the procedure, a History and Physical  ?                          was performed, and patient medications, allergies  ?                          and sensitivities were reviewed. The patient's  ?                          tolerance of previous anesthesia was reviewed. ?                          - The risks and benefits of the procedure and the  ?                          sedation options and risks were discussed with the  ?                          patient. All questions were answered and informed  ?                          consent was obtained. ?                          - ASA Grade Assessment: II - A patient with mild  ?                          systemic disease. ?                          After obtaining informed consent, the colonoscope  ?                          was passed under direct vision. Throughout the  ?  procedure, the patient's blood pressure, pulse, and  ?                          oxygen saturations were monitored continuously. The  ?                          [id number]-HQ190L ([id number]) scope was introduced through  ?                          the anus and advanced to the the cecum, identified  ?                          by appendiceal orifice and ileocecal valve.  The  ?                          colonoscopy was performed without difficulty. The  ?                          patient tolerated the procedure well. The quality  ?                          of the bowel preparation was good. ?Scope In: 11:19:37 AM ?Scope Out: 11:53:13 AM ?Scope Withdrawal Time: 0 hours 30 minutes 26 seconds  ?Total Procedure Duration: 0 hours 33 minutes 36 seconds  ?Findings: ?     The perianal and digital rectal examinations were normal. ?     Three sessile polyps were found in the sigmoid colon and transverse  ?     colon. The polyps were 2 to 4 mm in size. These polyps were removed with  ?     a cold snare. Resection and retrieval were complete. ?     One 8 mm submucosal nodule was found in the rectum. I attempted to  ?     remove this with a hot snare but the lesion was mobile and was not  ?     possible to put the snare effectively around it (?possible NET).  ?     Biopsies were taken from the submucosal area in a "biopsy on biopsy"  ?     fashion with a cold forceps for histology. ?     The retroflexed view of the distal rectum and anal verge was normal and  ?     showed no anal or rectal abnormalities. ?Impression:               - Three 2 to 4 mm polyps in the sigmoid colon and  ?                          in the transverse colon, removed with a cold snare.  ?                          Resected and retrieved. ?                          - Submucosal nodule in the rectum. Biopsied. ?                          -  The distal rectum and anal verge are normal on  ?                          retroflexion view. ?Moderate Sedation: ?     Per Anesthesia Care ?Recommendation:           - Discharge patient to home (ambulatory). ?                          - Resume previous diet. ?                          - Await pathology results. ?                          - Repeat colonoscopy for surveillance based on  ?                          pathology results. ?Procedure Code(s):        --- Professional --- ?                           [phone], Colonoscopy, flexible; with removal of  ?                          tumor(s), polyp(s), or other lesion(s) by snare  ?                          technique ?                          [id number], 59, Colonoscopy, flexible; with biopsy,  ?                          single or multiple ?Diagnosis Code(s):        --- Professional --- ?                          Z12.11, Encounter for screening for malignant  ?                          neoplasm of colon ?                          K63.5, Polyp of colon ?                          K62.89, Other specified diseases of anus and rectum ?CPT copyright 2019 [employer]. All rights reserved. ?The codes documented in this report are preliminary and upon coder review may  ?be revised to meet current compliance requirements. ?Maylon Peppers, MD ?Maylon Peppers,  ?08/28/2021 11:59:41 AM ?This report has been signed electronically. ?Number of Addenda: 0 ?

## 2021-08-29 LAB — SURGICAL PATHOLOGY

## 2021-09-02 ENCOUNTER — Encounter (HOSPITAL_COMMUNITY): Payer: Self-pay | Admitting: Gastroenterology

## 2021-09-03 DIAGNOSIS — H25013 Cortical age-related cataract, bilateral: Secondary | ICD-10-CM | POA: Diagnosis not present

## 2021-09-03 DIAGNOSIS — H5203 Hypermetropia, bilateral: Secondary | ICD-10-CM | POA: Diagnosis not present

## 2021-09-03 DIAGNOSIS — H524 Presbyopia: Secondary | ICD-10-CM | POA: Diagnosis not present

## 2021-09-03 DIAGNOSIS — H2513 Age-related nuclear cataract, bilateral: Secondary | ICD-10-CM | POA: Diagnosis not present

## 2021-09-03 DIAGNOSIS — H52203 Unspecified astigmatism, bilateral: Secondary | ICD-10-CM | POA: Diagnosis not present

## 2021-09-04 ENCOUNTER — Telehealth: Payer: Self-pay

## 2021-09-04 ENCOUNTER — Other Ambulatory Visit: Payer: Self-pay

## 2021-09-04 DIAGNOSIS — K621 Rectal polyp: Secondary | ICD-10-CM

## 2021-09-04 NOTE — Telephone Encounter (Signed)
Lower EUS has been scheduled for 09/18/21 at 10 am with GM at [facility] ? ?Left message on machine to call back  ?

## 2021-09-04 NOTE — Telephone Encounter (Signed)
-----   Message from Irving Copas., MD sent at 08/30/2021  1:35 PM EST ----- ?Regarding: RE: Rectal submucosal polyp ?Agree. ?GM ?----- Message ----- ?From: Harvel Quale, MD ?Sent: 08/30/2021   6:48 AM EST ?To: Milus Banister, MD, Timothy Lasso, RN, # ?Subject: RE: Rectal submucosal polyp                   ? ?Thanks a lot! ?----- Message ----- ?From: Milus Banister, MD ?Sent: 08/30/2021   6:15 AM EST ?To: Timothy Lasso, RN, Irving Copas., MD, # ?Subject: RE: Rectal submucosal polyp                   ? ?I saw the pictures.  I think EUS is a reasonable next step.  We will contact him. ? ?Mikayah Joy, ?He needs lower EUS for rectal nodule, first available for myself or Gabe ? ?[place] ? ?----- Message ----- ?From: Harvel Quale, MD ?Sent: 08/29/2021   5:13 PM EST ?To: Milus Banister, MD, # ?Subject: Rectal submucosal polyp                       ? ?Good afternoon, ?Hope you're doing good. ?Wanted to pick your brains about a rectal submucosal lesion I found during a screening colonoscopy. It was a yellowish nodule around 8 mm in dize, mobile without pillow sign. I tried to snare it but it did not allow me to entrap it with the snare. Given the location and appearance I thought this could be a NET (picture 8, 9 and 10) - tried to do biopsy on biopsy sampling but tissue only showed hyperplastic changes. ?Is this something you could check with an EUS? What are your thoughts? ? ?Appreciate your help! ? ?Best, ?Daniel ? ? ? ?

## 2021-09-04 NOTE — Telephone Encounter (Signed)
Lower EUS scheduled, pt instructed and medications reviewed.  Patient instructions mailed to home and sent to My Chart.  Patient to call with any questions or concerns.   

## 2021-09-10 ENCOUNTER — Encounter (HOSPITAL_COMMUNITY): Payer: Self-pay | Admitting: Gastroenterology

## 2021-09-18 ENCOUNTER — Ambulatory Visit (HOSPITAL_COMMUNITY)
Admission: RE | Admit: 2021-09-18 | Discharge: 2021-09-18 | Disposition: A | Discharge status: Home / Self Care | Payer: Medicare Other | Attending: Gastroenterology | Admitting: Gastroenterology

## 2021-09-18 ENCOUNTER — Other Ambulatory Visit: Payer: Self-pay

## 2021-09-18 ENCOUNTER — Ambulatory Visit (HOSPITAL_COMMUNITY): Payer: Medicare Other | Admitting: Anesthesiology

## 2021-09-18 ENCOUNTER — Encounter (HOSPITAL_COMMUNITY): Payer: Self-pay | Admitting: Gastroenterology

## 2021-09-18 ENCOUNTER — Ambulatory Visit (HOSPITAL_BASED_OUTPATIENT_CLINIC_OR_DEPARTMENT_OTHER): Payer: Medicare Other | Admitting: Anesthesiology

## 2021-09-18 ENCOUNTER — Encounter (HOSPITAL_COMMUNITY)
Admission: RE | Disposition: A | Discharge status: Home / Self Care | Payer: Self-pay | Source: Home / Self Care | Attending: Gastroenterology

## 2021-09-18 DIAGNOSIS — K621 Rectal polyp: Secondary | ICD-10-CM | POA: Diagnosis not present

## 2021-09-18 DIAGNOSIS — K5730 Diverticulosis of large intestine without perforation or abscess without bleeding: Secondary | ICD-10-CM

## 2021-09-18 DIAGNOSIS — Z87891 Personal history of nicotine dependence: Secondary | ICD-10-CM | POA: Insufficient documentation

## 2021-09-18 DIAGNOSIS — D125 Benign neoplasm of sigmoid colon: Secondary | ICD-10-CM | POA: Diagnosis not present

## 2021-09-18 DIAGNOSIS — K635 Polyp of colon: Secondary | ICD-10-CM

## 2021-09-18 DIAGNOSIS — K640 First degree hemorrhoids: Secondary | ICD-10-CM | POA: Insufficient documentation

## 2021-09-18 DIAGNOSIS — K5790 Diverticulosis of intestine, part unspecified, without perforation or abscess without bleeding: Secondary | ICD-10-CM | POA: Diagnosis not present

## 2021-09-18 DIAGNOSIS — K644 Residual hemorrhoidal skin tags: Secondary | ICD-10-CM | POA: Insufficient documentation

## 2021-09-18 DIAGNOSIS — D3610 Benign neoplasm of peripheral nerves and autonomic nervous system, unspecified: Secondary | ICD-10-CM | POA: Insufficient documentation

## 2021-09-18 DIAGNOSIS — E785 Hyperlipidemia, unspecified: Secondary | ICD-10-CM | POA: Insufficient documentation

## 2021-09-18 DIAGNOSIS — D3615 Benign neoplasm of peripheral nerves and autonomic nervous system of abdomen: Secondary | ICD-10-CM | POA: Diagnosis not present

## 2021-09-18 DIAGNOSIS — K6289 Other specified diseases of anus and rectum: Secondary | ICD-10-CM | POA: Insufficient documentation

## 2021-09-18 DIAGNOSIS — I899 Noninfective disorder of lymphatic vessels and lymph nodes, unspecified: Secondary | ICD-10-CM | POA: Diagnosis not present

## 2021-09-18 HISTORY — PX: FLEXIBLE SIGMOIDOSCOPY: SHX5431

## 2021-09-18 HISTORY — PX: HEMOSTASIS CLIP PLACEMENT: SHX6857

## 2021-09-18 HISTORY — PX: POLYPECTOMY: SHX5525

## 2021-09-18 HISTORY — PX: SUBMUCOSAL LIFTING INJECTION: SHX6855

## 2021-09-18 HISTORY — PX: EUS: SHX5427

## 2021-09-18 HISTORY — PX: ENDOSCOPIC MUCOSAL RESECTION: SHX6839

## 2021-09-18 SURGERY — ULTRASOUND, LOWER GI TRACT, ENDOSCOPIC
Anesthesia: Monitor Anesthesia Care

## 2021-09-18 MED ORDER — LIDOCAINE VISCOUS HCL 2 % MT SOLN
OROMUCOSAL | Status: DC | PRN
  Administered 2021-09-18: 20 mL

## 2021-09-18 MED ORDER — LACTATED RINGERS IV SOLN
INTRAVENOUS | Status: DC
Start: 2021-09-18 — End: 2021-09-18

## 2021-09-18 MED ORDER — EPHEDRINE SULFATE-NACL 50-0.9 MG/10ML-% IV SOSY
PREFILLED_SYRINGE | INTRAVENOUS | Status: DC | PRN
Start: 2021-09-18 — End: 2021-09-18
  Administered 2021-09-18: 5 mg via INTRAVENOUS

## 2021-09-18 MED ORDER — PROPOFOL 10 MG/ML IV BOLUS
INTRAVENOUS | Status: DC | PRN
  Administered 2021-09-18 (×3): 10 mg via INTRAVENOUS

## 2021-09-18 MED ORDER — PROPOFOL 1000 MG/100ML IV EMUL
INTRAVENOUS | Status: AC
  Filled 2021-09-18: qty 100

## 2021-09-18 MED ORDER — LIDOCAINE VISCOUS HCL 2 % MT SOLN
OROMUCOSAL | Status: AC
  Filled 2021-09-18: qty 15

## 2021-09-18 MED ORDER — PROPOFOL 500 MG/50ML IV EMUL
INTRAVENOUS | Status: DC | PRN
  Administered 2021-09-18: 110 ug/kg/min via INTRAVENOUS

## 2021-09-18 MED ORDER — LIDOCAINE HCL 1 % IJ SOLN
INTRAMUSCULAR | Status: DC | PRN
  Administered 2021-09-18: 50 mg via INTRADERMAL

## 2021-09-18 NOTE — Discharge Instructions (Signed)

## 2021-09-18 NOTE — Anesthesia Postprocedure Evaluation (Signed)
Anesthesia Post Note ? ?Patient: LAVAUGHN HABERLE ? ?Procedure(s) Performed: LOWER ENDOSCOPIC ULTRASOUND (EUS) ?FLEXIBLE SIGMOIDOSCOPY ?POLYPECTOMY ?SUBMUCOSAL LIFTING INJECTION ?ENDOSCOPIC MUCOSAL RESECTION ?HEMOSTASIS CLIP PLACEMENT ? ?  ? ?Patient location during evaluation: Endoscopy ?Anesthesia Type: MAC ?Level of consciousness: awake and alert ?Pain management: pain level controlled ?Vital Signs Assessment: post-procedure vital signs reviewed and stable ?Respiratory status: spontaneous breathing, nonlabored ventilation and respiratory function stable ?Cardiovascular status: blood pressure returned to baseline and stable ?Postop Assessment: no apparent nausea or vomiting ?Anesthetic complications: no ? ? ?No notable events documented. ? ?Last Vitals:  ?Vitals:  ? 09/18/21 1130 09/18/21 1140  ?BP: 136/76 (!) 147/72  ?Pulse: (!) 58 (!) 59  ?Resp: 17 20  ?Temp:    ?SpO2: 100% 98%  ?  ?Last Pain:  ?Vitals:  ? 09/18/21 1120  ?TempSrc:   ?PainSc: 0-No pain  ? ? ?  ?  ?  ?  ?  ?  ? ?Lidia Collum ? ? ? ? ?

## 2021-09-18 NOTE — Op Note (Signed)
[facility] ?Patient Name: Theodore Davis ?Procedure Date: 09/18/2021 ?MRN: [id number] ?Attending MD: Justice Britain , MD ?Date of Birth: Apr 25, 1946 ?CSN: [id number] ?Age: 76 ?Admit Type: Outpatient ?Procedure:                Lower EUS ?Indications:              Rectal deformity found on endoscopy; subepithelial  ?                          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                          Tyna Jaksch Technician ?Referring MD:             Maylon Peppers, Cammie Mcgee. Pickard ?Medicines:                Monitored Anesthesia Care ?Complications:            No immediate complications. ?Estimated Blood Loss:     Estimated blood loss was minimal. ?Procedure:                Pre-Anesthesia Assessment: ?                          - Prior to the procedure, a History and Physical  ?                          was performed, and patient medications and  ?                          allergies were reviewed. The patient's tolerance of  ?                          previous anesthesia was also reviewed. The risks  ?                          and benefits of the procedure and the sedation  ?                          options and risks were discussed with the patient.  ?                          All questions were answered, and informed consent  ?                          was obtained. Prior Anticoagulants: The patient has  ?                          taken no previous anticoagulant or antiplatelet  ?                          agents. ASA Grade  Assessment: III - A patient with  ?                          severe systemic disease. After reviewing the risks  ?                          and benefits, the patient was deemed in  ?                          satisfactory condition to undergo the procedure. ?                          After obtaining informed consent, the endoscope was  ?                           passed under direct vision. Throughout the  ?                          procedure, the patient's blood pressure, pulse, and  ?                          oxygen saturations were monitored continuously. The  ?                          [id number] ([id number]) Olympus therapeutic endoscope  ?                          was introduced through the anus and advanced to the  ?                          the left transverse colon. The [id number]  ?                          ([id number]) Olympus radial ultrasound scope was  ?                          introduced through the anus and advanced to the the  ?                          sigmoid colon for ultrasound. The lower EUS was  ?                          accomplished without difficulty. The patient  ?                          tolerated the procedure. The patient tolerated the  ?                          procedure. ?Scope In: 10:25:47 AM ?Scope Out: 11:02:48 AM ?Scope Withdrawal Time: 0 hours 33 minutes 53 seconds  ?Total Procedure Duration: 0 hours 37 minutes 1 second  ?Findings: ?     The digital rectal exam findings include hemorrhoids. Pertinent  ?     negatives include no palpable rectal lesions. ?     ENDOSCOPIC  FINDING: : ?     A 4 mm polyp was found in the sigmoid colon. The polyp was sessile. The  ?     polyp was removed with a saline injection-lift technique using a hot  ?     snare. Resection and retrieval were complete. ?     Multiple small-mouthed diverticula were found in the recto-sigmoid colon  ?     and sigmoid colon. ?     A small scar was found in the distal rectum. There did appear to be some  ?     sub-epithelial lesion that could be noted below this scar (presumably  ?     from previous biopsies). After the EUS, and due to concern that this  ?     could be a rectal NET, preparations were made for mucosal resection. NBI  ?     imaging and White-light endoscopy was done to demarcate the borders of  ?     the lesion. Everlift was injected to raise the  area. Band ligator was  ?     placed around the lesion and snare mucosal resection was performed.  ?     Resection and retrieval were complete. To prevent bleeding after mucosal  ?     resection, four hemostatic clips were successfully placed (MR  ?     conditional). There was no bleeding during, or at the end, of the  ?     procedure. ?     Anal papillae were hypertrophied. ?     Non-bleeding non-thrombosed external and internal hemorrhoids were found  ?     during retroflexion. The hemorrhoids were Grade I (internal hemorrhoids  ?     that do not prolapse). ?     ENDOSONOGRAPHIC FINDING: : ?     A round intramural (subepithelial) lesion was found in the rectum. The  ?     lesion was non-circumferential and located predominantly at the  ?     left-posterolateral rectal wall. The lesion was hypoechoic. It measured  ?     6 mm by 3 mm in size. Sonographically, the origin appeared to be within  ?     the deep mucosa (Layer 2). The endosonographic borders were smooth. ?     The rectum was otherwise normal in EUS appearance. ?     No malignant-appearing lymph nodes were visualized in the perirectal  ?     region and in the left iliac region. The nodes were. ?     The internal anal sphincter was visualized endosonographically and  ?     appeared normal. ?Impression:               FLEX Impression: ?                          - Hemorrhoids found on digital rectal exam. ?                          - One 4 mm polyp in the sigmoid colon, removed  ?                          using injection-lift and a hot snare. Resected and  ?  retrieved. ?                          - Diverticulosis in the recto-sigmoid colon and in  ?                          the sigmoid colon. ?                          - Scar in the distal rectum. Some SEL appearance.  ?                          Aftere EUS, this area was resected via Cap band  ?                          mucosal resection. Clips (MR conditional) were  ?                           placed. ?                          - Anal papilla(e) were hypertrophied. ?                          - Non-bleeding non-thrombosed external and internal  ?                          hemorrhoids. ?                          EUS Impression: ?                          - An intramural (subepithelial) lesion was  ?                          visualized endosonographically in the rectum. The  ?                          origin of the lesion appeared to be within the deep  ?                          mucosa (Layer 2). ?                          - Endosonographic images of the rectum were  ?                          otherwise unremarkable. ?                          - No malignant-appearing lymph nodes were  ?                          visualized endosonographically in the perirectal  ?  region and in the left iliac region. ?                          - The internal anal sphincter was visualized  ?                          endosonographically and appeared normal. ?Moderate Sedation: ?     Not Applicable - Patient had care per Anesthesia. ?Recommendation:           - The patient will be observed post-procedure,  ?                          until all discharge criteria are met. ?                          - Discharge patient to home. ?                          - Patient has a contact number available for  ?                          emergencies. The signs and symptoms of potential  ?                          delayed complications were discussed with the  ?                          patient. Return to normal activities tomorrow.  ?                          Written discharge instructions were provided to the  ?                          patient. ?                          - High fiber diet. ?                          - Use FiberCon 1-2 tablets PO daily. ?                          - Await path results. ?                          - Follow up to be dictated by results of pathology. ?                          - The  findings and recommendations were discussed  ?                          with the patient. ?                          - The findings and recommendations were discussed  ?  with

## 2021-09-18 NOTE — Transfer of Care (Signed)
Immediate Anesthesia Transfer of Care Note ? ?Patient: Theodore Davis ? ?Procedure(s) Performed: LOWER ENDOSCOPIC ULTRASOUND (EUS) ?FLEXIBLE SIGMOIDOSCOPY ?POLYPECTOMY ?SUBMUCOSAL LIFTING INJECTION ?ENDOSCOPIC MUCOSAL RESECTION ?HEMOSTASIS CLIP PLACEMENT ? ?Patient Location: PACU and Endoscopy Unit ? ?Anesthesia Type:MAC ? ?Level of Consciousness: awake, alert , oriented and patient cooperative ? ?Airway & Oxygen Therapy: Patient Spontanous Breathing and Patient connected to face mask oxygen ? ?Post-op Assessment: Report given to RN and Post -op Vital signs reviewed and stable ? ?Post vital signs: Reviewed and stable ? ?Last Vitals:  ?Vitals Value Taken Time  ?BP 115/61 09/18/21 1115  ?Temp    ?Pulse 62 09/18/21 1116  ?Resp 23 09/18/21 1116  ?SpO2 100 % 09/18/21 1116  ?Vitals shown include unvalidated device data. ? ?Last Pain:  ?Vitals:  ? 09/18/21 0800  ?TempSrc: Temporal  ?PainSc: 0-No pain  ?   ? ?  ? ?Complications: No notable events documented. ?

## 2021-09-18 NOTE — Anesthesia Preprocedure Evaluation (Signed)
Anesthesia Evaluation  ?Patient identified by MRN, date of birth, ID band ?Patient awake ? ? ? ?Reviewed: ?Allergy & Precautions, NPO status , Patient's Chart, lab work & pertinent test results ? ?History of Anesthesia Complications ?Negative for: history of anesthetic complications ? ?Airway ?Mallampati: II ? ?TM Distance: >3 FB ?Neck ROM: Full ? ? ? Dental ?  ?Pulmonary ?neg pulmonary ROS, former smoker,  ?  ?Pulmonary exam normal ? ? ? ? ? ? ? Cardiovascular ?Normal cardiovascular exam ? ?HLD ? ?Echo 01/21/21: EF 65-70%, no RWMA, g1dd, normal RVSF, mild AV sclerosis w/o stenosis ?  ?Neuro/Psych ?negative neurological ROS ?   ? GI/Hepatic ?Neg liver ROS, Rectal polyp ?  ?Endo/Other  ?negative endocrine ROS ? Renal/GU ?negative Renal ROS  ?negative genitourinary ?  ?Musculoskeletal ? ?(+) Arthritis ,  ? Abdominal ?  ?Peds ? Hematology ?negative hematology ROS ?(+)   ?Anesthesia Other Findings ? ? Reproductive/Obstetrics ? ?  ? ? ? ? ? ? ? ? ? ? ? ? ? ?  ?  ? ? ? ? ? ? ? ? ?Anesthesia Physical ?Anesthesia Plan ? ?ASA: 2 ? ?Anesthesia Plan: MAC  ? ?Post-op Pain Management: Minimal or no pain anticipated  ? ?Induction: Intravenous ? ?PONV Risk Score and Plan: 1 and Propofol infusion, TIVA and Treatment may vary due to age or medical condition ? ?Airway Management Planned: Natural Airway, Nasal Cannula and Simple Face Mask ? ?Additional Equipment: None ? ?Intra-op Plan:  ? ?Post-operative Plan:  ? ?Informed Consent: I have reviewed the patients History and Physical, chart, labs and discussed the procedure including the risks, benefits and alternatives for the proposed anesthesia with the patient or authorized representative who has indicated his/her understanding and acceptance.  ? ? ? ? ? ?Plan Discussed with:  ? ?Anesthesia Plan Comments:   ? ? ? ? ? ? ?Anesthesia Quick Evaluation ? ?

## 2021-09-18 NOTE — H&P (Signed)
? ?GASTROENTEROLOGY PROCEDURE H&P NOTE  ? ?Primary Care Physician: ?Susy Frizzle, MD ? ?HPI: ?Theodore Davis is a 76 y.o. male who presents for Lower EUS/Sigmoidoscopy to evaluate a rectal subepithelial lesion and possible biopsy/resection. ? ?Past Medical History:  ?Diagnosis Date  ? Arthritis 10/03/2010  ? right sternoclavicular joint  ? Heart murmur   ? Hemangioma of spine   ? lower lumbar  ? HOH (hard of hearing)   ? R ear  ? Hyperlipidemia   ? hypercholesterolemia  ? Lyme disease   ? ?Past Surgical History:  ?Procedure Laterality Date  ? BIOPSY  08/28/2021  ? Procedure: BIOPSY;  Surgeon: Harvel Quale, MD;  [facility];  Service: Gastroenterology;;  ? CARDIOVASCULAR STRESS TEST  08/2010  ? CHOLECYSTECTOMY    ? COLONOSCOPY WITH PROPOFOL N/A 08/28/2021  ? Procedure: COLONOSCOPY WITH PROPOFOL;  Surgeon: Harvel Quale, MD;  Location: [facility];  Service: Gastroenterology;  Laterality: N/A;  1045  ASA 1  ? HERNIA REPAIR    ? R inguinal  ? JOINT REPLACEMENT Right   ? MASTOID DEBRIDEMENT    ? R ear  ? POLYPECTOMY  08/28/2021  ? Procedure: POLYPECTOMY;  Surgeon: Harvel Quale, MD;  Location: [facility];  Service: Gastroenterology;;  ? RESECTION DISTAL CLAVICAL Left 11/30/2014  ? Procedure: RESECTION DISTAL CLAVICAL;  Surgeon: Kathryne Hitch, MD;  Location: [place];  Service: Orthopedics;  Laterality: Left;  ? SHOULDER ARTHROSCOPY WITH BICEPSTENOTOMY Left 11/30/2014  ? Procedure: SHOULDER ARTHROSCOPY WITH BICEPSTENOTOMY;  Surgeon: Kathryne Hitch, MD;  Location: [place];  Service: Orthopedics;  Laterality: Left;  ? SHOULDER ARTHROSCOPY WITH ROTATOR CUFF REPAIR AND SUBACROMIAL DECOMPRESSION Left 11/30/2014  ? Procedure: LEFT SHOULDER ARTHROSCOPY WITH DISTAL CLAVICAL EXCISION, ACROMIOPLASTY, ROTATOR CUFF REPAIR , BICEPS TENOTOMY;  Surgeon: Kathryne Hitch, MD;  Location: [place];  Service: Orthopedics;  Laterality: Left;  ?  TENDON REPAIR  2005  ? R elbow  ? TOTAL KNEE ARTHROPLASTY  05/28/2011  ? Procedure: TOTAL KNEE ARTHROPLASTY;  Surgeon: Ninetta Lights, MD;  Location: [place];  Service: Orthopedics;  Laterality: Right;  Right total knee arthroplasty   ? US ECHOCARDIOGRAPHY  08/2010  ? ?Current Facility-Administered Medications  ?Medication Dose Route Frequency Provider Last Rate Last Admin  ? lactated ringers infusion   Intravenous Continuous Mansouraty, Telford Nab., MD 10 mL/hr at 09/18/21 0933 Continued from Pre-op at 09/18/21 0933  ? ? ?Current Facility-Administered Medications:  ?  lactated ringers infusion, , Intravenous, Continuous, Mansouraty, Telford Nab., MD, Last Rate: 10 mL/hr at 09/18/21 0933, Continued from Pre-op at 09/18/21 0933 ?Allergies  ?Allergen Reactions  ? Codeine Nausea And Vomiting  ? Lipitor [Atorvastatin Calcium] Other (See Comments)  ?  myalgias  ? ?Family History  ?Problem Relation Age of Onset  ? Heart disease Mother   ? Atrial fibrillation Mother   ? Heart disease Father   ? Rheum arthritis Father   ? Bladder Cancer Father   ? Atrial fibrillation Father   ? Brain cancer Maternal Uncle   ? Lung disease Neg Hx   ? ?Social History  ? ?Socioeconomic History  ? Marital status: Married  ?  Spouse name: Not on file  ? Number of children: 2  ? Years of education: Not on file  ? Highest education level: Not on file  ?Occupational History  ? Not on file  ?Tobacco Use  ? Smoking status: Former  ?  Packs/day: 0.25  ?  Years: 50.00  ?  Pack years: 12.50  ?  Types: Cigarettes  ?  Quit date: 06/23/1997  ?  Years since quitting: 24.2  ? Smokeless tobacco: Former  ?Vaping Use  ? Vaping Use: Never used  ?Substance and Sexual Activity  ? Alcohol use: Yes  ?  Comment: occasional beer  ? Drug use: No  ? Sexual activity: Yes  ?Other Topics Concern  ? Not on file  ?Social History Narrative  ? [facility] (04/08/17):  ? Originally from [facility]. Has lived in [place] as well. Previously worked for the Education officer, environmental as a Dealer. Has 2  dogs & a rabbit currently. No bird exposure. He reports black mold was found in his air conditioner at [facility] when it was changed out 2 years ago. No hot tub exposure. Enjoys fishing.   ? Married since 1974.   ? 2 sons  ? 3 grandchildren.  ? ?Social Determinants of Health  ? ?Financial Resource Strain: Low Risk   ? Difficulty of Paying Living Expenses: Not hard at all  ?Food Insecurity: No Food Insecurity  ? Worried About Charity fundraiser in the Last Year: Never true  ? Ran Out of Food in the Last Year: Never true  ?Transportation Needs: No Transportation Needs  ? Lack of Transportation (Medical): No  ? Lack of Transportation (Non-Medical): No  ?Physical Activity: Sufficiently Active  ? Days of Exercise per Week: 5 days  ? Minutes of Exercise per Session: 30 min  ?Stress: No Stress Concern Present  ? Feeling of Stress : Not at all  ?Social Connections: Socially Integrated  ? Frequency of Communication with Friends and Family: More than three times a week  ? Frequency of Social Gatherings with Friends and Family: More than three times a week  ? Attends Religious Services: More than 4 times per year  ? Active Member of Clubs or Organizations: Yes  ? Attends Archivist Meetings: More than 4 times per year  ? Marital Status: Married  ?Intimate Partner Violence: Not At Risk  ? Fear of Current or Ex-Partner: No  ? Emotionally Abused: No  ? Physically Abused: No  ? Sexually Abused: No  ? ? ?Physical Exam: ?Today's Vitals  ? 09/18/21 0800  ?BP: (!) 143/76  ?Pulse: 60  ?Resp: 13  ?Temp: 98.3 ?F (36.8 ?C)  ?TempSrc: Temporal  ?SpO2: 97%  ?Weight: 84.4 kg  ?Height: '5\' 9"'$  (1.753 m)  ?PainSc: 0-No pain  ? ?Body mass index is 27.47 kg/m?. ?GEN: NAD ?EYE: Sclerae anicteric ?ENT: MMM ?CV: Non-tachycardic ?GI: Soft, NT/ND ?NEURO:  Alert & Oriented x 3 ? ?Lab Results: ?No results for input(s): WBC, HGB, HCT, PLT in the last 72 hours. ?BMET ?No results for input(s): NA, K, CL, CO2, GLUCOSE, BUN, CREATININE, CALCIUM  in the last 72 hours. ?LFT ?No results for input(s): PROT, ALBUMIN, AST, ALT, ALKPHOS, BILITOT, BILIDIR, IBILI in the last 72 hours. ?PT/INR ?No results for input(s): LABPROT, INR in the last 72 hours. ? ? ?Impression / Plan: ?This is a 76 y.o.male  who presents for Lower EUS/Sigmoidoscopy to evaluate a rectal subepithelial lesion and possible biopsy/resection. ? ?The risks of an EUS including intestinal perforation, bleeding, infection, aspiration, and medication effects were discussed as was the possibility it may not give a definitive diagnosis if a biopsy is performed.  ? ? ?The risks and benefits of endoscopic evaluation/treatment were discussed with the patient and/or family; these include but are not limited to the risk of  perforation, infection, bleeding, missed lesions, lack of diagnosis, severe illness requiring hospitalization, as well as anesthesia and sedation related illnesses.  The patient's history has been reviewed, patient examined, no change in status, and deemed stable for procedure.  The patient and/or family is agreeable to proceed.  ? ? ?Justice Britain, MD ?[facility] ?Advanced Endoscopy ?Office # [phone] ? ?

## 2021-09-19 ENCOUNTER — Encounter (HOSPITAL_COMMUNITY): Payer: Self-pay | Admitting: Gastroenterology

## 2021-09-20 ENCOUNTER — Encounter: Payer: Self-pay | Admitting: Gastroenterology

## 2021-09-20 LAB — SURGICAL PATHOLOGY

## 2021-11-06 NOTE — H&P (Signed)
History and Physical    Patient's Name/Date of Birth: Troy Mcdaniel / [birth year] (76 y.o.)    Date: Nov 06, 2021     Chief Complaint: Decreased vision of the left eye and chronic open angle glaucoma    HPI: Mature left cataract with decreased vision and chronic open angle glaucoma.  Risks and complications as well as options and benefits were discussed with the patient and he has elected to proceed with left cataract extraction with intra ocular lens implant and IStent.    Past Medical History:   Diagnosis Date    Asthma     Cancer (HCC)     prostate-removed    Hyperlipidemia     Hypertension        Past Surgical History:   Procedure Laterality Date    CATARACT EXTRACTION Right     COLONOSCOPY      PECTORALIS MAJOR REPAIR      PROSTATECTOMY         Prior to Admission medications    Medication Sig Start Date End Date Taking? Authorizing Provider   pravastatin (PRAVACHOL) 40 MG tablet Take 1 tablet by mouth daily   Yes Historical Provider, MD   fluticasone (FLONASE) 50 MCG/ACT nasal spray 1 spray by Each Nostril route daily    Historical Provider, MD   albuterol sulfate HFA (PROAIR HFA) 108 (90 Base) MCG/ACT inhaler Inhale 2 puffs into the lungs every 6 hours as needed for Wheezing 08/30/18 11/05/21  Reino Kent, APRN - CNP   latanoprost (XALATAN) 0.005 % ophthalmic solution  07/09/18   Historical Provider, MD   montelukast (SINGULAIR) 10 MG tablet Take 1 tablet by mouth nightly    Historical Provider, MD   budesonide-formoterol (SYMBICORT) 160-4.5 MCG/ACT AERO Inhale 2 puffs into the lungs 2 times daily    Historical Provider, MD   losartan (COZAAR) 50 MG tablet Take 1 tablet by mouth daily    Historical Provider, MD   timolol (TIMOPTIC) 0.25 % ophthalmic solution 1 drop 2 times daily    Historical Provider, MD   Multiple Vitamins-Minerals (THERAPEUTIC MULTIVITAMIN-MINERALS) tablet Take 1 tablet by mouth daily    Historical Provider, MD       Patient has no known allergies.    Family History   Problem Relation Age of  Onset    Dementia Mother     Cancer Father        Social History     Socioeconomic History    Marital status: Married     Spouse name: Not on file    Number of children: Not on file    Years of education: Not on file    Highest education level: Not on file   Occupational History    Not on file   Tobacco Use    Smoking status: Former    Smokeless tobacco: Never   Vaping Use    Vaping Use: Never used   Substance and Sexual Activity    Alcohol use: Yes     Alcohol/week: 1.0 standard drink     Types: 1 Glasses of wine per week     Comment: occas    Drug use: No    Sexual activity: Not on file   Other Topics Concern    Not on file   Social History Narrative    Not on file     Social Determinants of Health     Financial Resource Strain: Not on file   Food Insecurity: Not on file  Transportation Needs: Not on file   Physical Activity: Not on file   Stress: Not on file   Social Connections: Not on file   Intimate Partner Violence: Not on file   Housing Stability: Not on file       Review of Systems:   CONSTITUTIONAL:  Oriented to person, place and time  EYES:  Mature left cataract with decreased vision and chronic open angle Glaucoma effecting reading, driving and all routine home activities.  Visual acuity is 20/80  HEENT:  negative  RESPIRATORY:  negative  CARDIOVASCULAR:  negative  GASTROINTESTINAL:  negative  GENITOURINARY:  negative  INTEGUMENT/BREAST:  negative  HEMATOLOGIC/LYMPHATIC:  negative  ALLERGIC/IMMUNOLOGIC:  negative  ENDOCRINE:  negative  MUSCULOSKELETAL:  negative  NEUROLOGICAL:  negative  BEHAVIOR/PSYCH:  negative    Physical Exam:  Vitals:    11/05/21 1247   Weight: 215 lb (97.5 kg)   Height: 6\' 2"  (1.88 m)       CONSTITUTIONAL:  awake, alert, cooperative, no apparent distress, and appears stated age  EYES:  Lids and lashes normal, pupils equal, round and reactive to light, extra ocular muscles intact, sclera clear, conjunctiva normal  ENT:  Normocephalic, without obvious abnormality, atraumatic,  sinuses nontender on palpation, external ears without lesions, oral pharynx with moist mucus membranes, tonsils without erythema or exudates, gums normal and good dentition.  NECK:  Supple, symmetrical, trachea midline, no adenopathy, thyroid symmetric, not enlarged and no tenderness, skin normal  HEMATOLOGIC/LYMPHATICS:  no cervical lymphadenopathy and no supraclavicular lymphadenopathy  BACK:  Symmetric, no curvature, spinous processes are non-tender on palpation, paraspinous muscles are non-tender on palpation, no costal vertebral tenderness  LUNGS:  No increased work of breathing, good air exchange, clear to auscultation bilaterally, no crackles or wheezing  CARDIOVASCULAR:  Normal apical impulse, regular rate and rhythm, normal S1 and S2, no S3 or S4, and no murmur noted  ABDOMEN:  No scars, normal bowel sounds, soft, non-distended, non-tender, no masses palpated, no hepatosplenomegally  CHEST/BREASTS:  Breasts symmetrical, skin without lesion(s), no nipple retraction or dimpling, no nipple discharge, no masses palpated, no axillary or supraclavicular adenopathy  GENITAL/URINARY:  Deferred  MUSCULOSKELETAL:  There is no redness, warmth, or swelling of the joints.  Full range of motion noted.  Motor strength is 5 out of 5 all extremities bilaterally.  Tone is normal.  NEUROLOGIC:  Awake, alert, oriented to name, place and time.  Cranial nerves II-XII are grossly intact.  Motor is 5 out of 5 bilaterally.  Cerebellar finger to nose, heel to shin intact.  Sensory is intact.  Babinski down going, Romberg negative, and gait is normal.  SKIN:  no bruising or bleeding, normal skin color, texture, turgor, no redness, warmth, or swelling, no rashes, and no lesions    Assessment:  Active Problems:    * No active hospital problems. *  Resolved Problems:    * No resolved hospital problems. *      Plan:  Left cataract extraction with intra ocular lens implant and IStent     Electronically signed by , MD on  11/06/21 at 3:40 PM EDT

## 2021-11-11 NOTE — Anesthesia Pre-Procedure Evaluation (Signed)
Department of Anesthesiology  Preprocedure Note       Name:  Troy Mcdaniel   Age:  76 y.o.  DOB:  1945-10-13                                          MRN:  [id number]         Date:  11/11/2021      Surgeon: Moishe Spice):  Whitney Post, MD    Procedure: Procedure(s):  LEFT CATARACT EXTRACTION WITH IOL AND ISTENT    Medications prior to admission:   Prior to Admission medications    Medication Sig Start Date End Date Taking? Authorizing Provider   pravastatin (PRAVACHOL) 40 MG tablet Take 1 tablet by mouth daily   Yes Historical Provider, MD   fluticasone (FLONASE) 50 MCG/ACT nasal spray 1 spray by Each Nostril route daily    Historical Provider, MD   albuterol sulfate HFA (PROAIR HFA) 108 (90 Base) MCG/ACT inhaler Inhale 2 puffs into the lungs every 6 hours as needed for Wheezing 08/30/18 11/05/21  Reino Kent, APRN - CNP   latanoprost (XALATAN) 0.005 % ophthalmic solution  07/09/18   Historical Provider, MD   montelukast (SINGULAIR) 10 MG tablet Take 1 tablet by mouth nightly    Historical Provider, MD   budesonide-formoterol (SYMBICORT) 160-4.5 MCG/ACT AERO Inhale 2 puffs into the lungs 2 times daily    Historical Provider, MD   losartan (COZAAR) 50 MG tablet Take 1 tablet by mouth daily    Historical Provider, MD   timolol (TIMOPTIC) 0.25 % ophthalmic solution 1 drop 2 times daily    Historical Provider, MD   Multiple Vitamins-Minerals (THERAPEUTIC MULTIVITAMIN-MINERALS) tablet Take 1 tablet by mouth daily    Historical Provider, MD       Current medications:    No current facility-administered medications for this encounter.     Current Outpatient Medications   Medication Sig Dispense Refill   . pravastatin (PRAVACHOL) 40 MG tablet Take 1 tablet by mouth daily     . fluticasone (FLONASE) 50 MCG/ACT nasal spray 1 spray by Each Nostril route daily     . albuterol sulfate HFA (PROAIR HFA) 108 (90 Base) MCG/ACT inhaler Inhale 2 puffs into the lungs every 6 hours as needed for Wheezing 1 Inhaler 0   . latanoprost  (XALATAN) 0.005 % ophthalmic solution      . montelukast (SINGULAIR) 10 MG tablet Take 1 tablet by mouth nightly     . budesonide-formoterol (SYMBICORT) 160-4.5 MCG/ACT AERO Inhale 2 puffs into the lungs 2 times daily     . losartan (COZAAR) 50 MG tablet Take 1 tablet by mouth daily     . timolol (TIMOPTIC) 0.25 % ophthalmic solution 1 drop 2 times daily     . Multiple Vitamins-Minerals (THERAPEUTIC MULTIVITAMIN-MINERALS) tablet Take 1 tablet by mouth daily         Allergies:  No Known Allergies    Problem List:  There is no problem list on file for this patient.      Past Medical History:        Diagnosis Date   . Asthma    . Cancer (HCC)     prostate-removed   . Hyperlipidemia    . Hypertension        Past Surgical History:        Procedure Laterality Date   . CATARACT EXTRACTION  Right    . COLONOSCOPY     . PECTORALIS MAJOR REPAIR     . PROSTATECTOMY         Social History:    Social History     Tobacco Use   . Smoking status: Former   . Smokeless tobacco: Never   Substance Use Topics   . Alcohol use: Yes     Alcohol/week: 1.0 standard drink     Types: 1 Glasses of wine per week     Comment: occas                                Counseling given: Not Answered      Vital Signs (Current):   Vitals:    11/05/21 1247   Weight: 215 lb (97.5 kg)   Height: 6\' 2"  (1.88 m)                                              BP Readings from Last 3 Encounters:   03/31/21 135/62   08/30/18 129/84   06/29/18 123/81       NPO Status:                                                                                 BMI:   Wt Readings from Last 3 Encounters:   03/31/21 210 lb (95.3 kg)   08/30/18 215 lb (97.5 kg)   07/30/18 215 lb (97.5 kg)     Body mass index is 27.6 kg/m.    CBC:   Lab Results   Component Value Date/Time    WBC 7.0 12/03/2011 03:45 PM    RBC 4.89 12/03/2011 03:45 PM    HGB 14.6 12/03/2011 03:45 PM    HCT 43.2 12/03/2011 03:45 PM    MCV 88.3 12/03/2011 03:45 PM    RDW 12.8 12/03/2011 03:45 PM    PLT 162 12/03/2011  03:45 PM       CMP:   Lab Results   Component Value Date/Time    NA 129 12/03/2011 03:45 PM    K 4.5 12/03/2011 03:45 PM    CL 98 12/03/2011 03:45 PM    CO2 22 12/03/2011 03:45 PM    BUN 15 12/03/2011 03:45 PM    CREATININE 0.9 12/03/2011 03:45 PM    LABGLOM >60 12/03/2011 03:27 PM    GLUCOSE 103 12/03/2011 03:45 PM    CALCIUM 9.4 12/03/2011 03:45 PM       POC Tests: No results for input(s): POCGLU, POCNA, POCK, POCCL, POCBUN, POCHEMO, POCHCT in the last 72 hours.    Coags: No results found for: PROTIME, INR, APTT    HCG (If Applicable): No results found for: PREGTESTUR, PREGSERUM, HCG, HCGQUANT     ABGs: No results found for: PHART, PO2ART, PCO2ART, HCO3ART, BEART, O2SATART     Type & Screen (If Applicable):  No results found for: LABABO, LABRH    Drug/Infectious Status (If Applicable):  No results found for: HIV, HEPCAB  COVID-19 Screening (If Applicable): No results found for: COVID19        Anesthesia Evaluation  Patient summary reviewed  Airway:           Dental:          Pulmonary:   (+) asthma:                            Cardiovascular:    (+) hypertension:, hyperlipidemia         Beta Blocker:  Not on Beta Blocker         Neuro/Psych:   Negative Neuro/Psych ROS              GI/Hepatic/Renal: Neg GI/Hepatic/Renal ROS            Endo/Other: Negative Endo/Other ROS   (+) malignancy/cancer.                 Abdominal:             Vascular:          Other Findings:           Anesthesia Plan      MAC     ASA 3       Induction: intravenous.                        Chart Review:  Chart was reviewed on Nov 11, 2021 at 10:24 AM by  Vergie Living, MD.  (Final assessment and plan day of surgery.)     Vergie Living, MD   11/11/2021

## 2021-11-12 ENCOUNTER — Inpatient Hospital Stay: Payer: Medicare (Managed Care) | Attending: Ophthalmology

## 2021-11-12 MED ORDER — FENTANYL CITRATE (PF) 100 MCG/2ML IJ SOLN
100 MCG/2ML | INTRAMUSCULAR | Status: DC | PRN
Start: 2021-11-12 — End: 2021-11-12
  Administered 2021-11-12 (×2): 50 via INTRAVENOUS

## 2021-11-12 MED ORDER — MIDAZOLAM HCL 2 MG/2ML IJ SOLN
2 MG/ML | INTRAMUSCULAR | Status: AC
Start: 2021-11-12 — End: ?

## 2021-11-12 MED ORDER — PROPARACAINE HCL 0.5 % OP SOLN
0.5 % | OPHTHALMIC | Status: DC
Start: 2021-11-12 — End: 2021-11-12
  Administered 2021-11-12: 11:00:00 1 [drp] via OPHTHALMIC

## 2021-11-12 MED ORDER — PHENYLEPHRINE HCL 10 % OP SOLN
10 % | OPHTHALMIC | Status: DC | PRN
Start: 2021-11-12 — End: 2021-11-12
  Administered 2021-11-12: 11:00:00 1 [drp] via OPHTHALMIC

## 2021-11-12 MED ORDER — BSS IO SOLN
INTRAOCULAR | Status: DC | PRN
Start: 2021-11-12 — End: 2021-11-12
  Administered 2021-11-12: 12:00:00 500 via OPHTHALMIC

## 2021-11-12 MED ORDER — PHENYLEPHRINE HCL 2.5 % OP SOLN
2.5 % | OPHTHALMIC | Status: DC
Start: 2021-11-12 — End: 2021-11-12
  Administered 2021-11-12: 11:00:00 1 [drp] via OPHTHALMIC

## 2021-11-12 MED ORDER — LACTATED RINGERS IV SOLN
INTRAVENOUS | Status: DC
Start: 2021-11-12 — End: 2021-11-12
  Administered 2021-11-12: 11:00:00 via INTRAVENOUS

## 2021-11-12 MED ORDER — SODIUM HYALURONATE 8.5 MG/0.85ML IO SOSY
8.5 MG/0.85ML | INTRAOCULAR | Status: DC | PRN
Start: 2021-11-12 — End: 2021-11-12
  Administered 2021-11-12: 12:00:00 8.5 via INTRAOCULAR

## 2021-11-12 MED ORDER — CYCLOPENTOLATE HCL 1 % OP SOLN
1 % | OPHTHALMIC | Status: DC
Start: 2021-11-12 — End: 2021-11-12
  Administered 2021-11-12: 11:00:00 1 [drp] via OPHTHALMIC

## 2021-11-12 MED ORDER — TETRACAINE HCL 0.5 % OP SOLN
0.5 % | OPHTHALMIC | Status: DC | PRN
Start: 2021-11-12 — End: 2021-11-12
  Administered 2021-11-12: 12:00:00 2 via OPHTHALMIC

## 2021-11-12 MED ORDER — TETRACAINE HCL 0.5 % OP SOLN
0.5 % | Freq: Once | OPHTHALMIC | Status: AC
Start: 2021-11-12 — End: 2021-11-12
  Administered 2021-11-12: 11:00:00 1 [drp] via OPHTHALMIC

## 2021-11-12 MED ORDER — FENTANYL CITRATE (PF) 100 MCG/2ML IJ SOLN
100 MCG/2ML | INTRAMUSCULAR | Status: AC
Start: 2021-11-12 — End: ?

## 2021-11-12 MED ORDER — FLURBIPROFEN SODIUM 0.03 % OP SOLN
0.03 % | OPHTHALMIC | Status: DC
Start: 2021-11-12 — End: 2021-11-12
  Administered 2021-11-12: 11:00:00 1 [drp] via OPHTHALMIC

## 2021-11-12 MED ORDER — MIDAZOLAM HCL 2 MG/2ML IJ SOLN
2 MG/ML | INTRAMUSCULAR | Status: DC | PRN
Start: 2021-11-12 — End: 2021-11-12
  Administered 2021-11-12: 12:00:00 2 via INTRAVENOUS

## 2021-11-12 MED FILL — FLURBIPROFEN SODIUM 0.03 % OP SOLN: 0.03 % | OPHTHALMIC | Qty: 2.5

## 2021-11-12 MED FILL — FENTANYL CITRATE (PF) 100 MCG/2ML IJ SOLN: 100 MCG/2ML | INTRAMUSCULAR | Qty: 2

## 2021-11-12 MED FILL — PROPARACAINE HCL 0.5 % OP SOLN: 0.5 % | OPHTHALMIC | Qty: 15

## 2021-11-12 MED FILL — TETRAVISC 0.5 % OP SOLN: 0.5 % | OPHTHALMIC | Qty: 5

## 2021-11-12 MED FILL — LACTATED RINGERS IV SOLN: INTRAVENOUS | Qty: 500

## 2021-11-12 MED FILL — MIDAZOLAM HCL 2 MG/2ML IJ SOLN: 2 MG/ML | INTRAMUSCULAR | Qty: 2

## 2021-11-12 MED FILL — CYCLOGYL 1 % OP SOLN: 1 % | OPHTHALMIC | Qty: 2

## 2021-11-12 MED FILL — PHENYLEPHRINE HCL 2.5 % OP SOLN: 2.5 % | OPHTHALMIC | Qty: 15

## 2021-11-12 NOTE — H&P (Signed)
Update History & Physical    The patient's History and Physical of 5 / 17 / 2023 was reviewed with the patient and there were no significant changes.    I examined the patient and there were no significant changes from the previous History and Physical.    Plan: The risk, benefits, expected outcome, and alternative to the recommended procedure have been discussed with the patient.  Patient understands and wants to proceed with the procedure.    Electronically signed by Whitney Post, MD on 11/12/21 at 6:52 AM EDT

## 2021-11-12 NOTE — Op Note (Signed)
[facility]                    [address] [place], [place] [address]                                OPERATIVE REPORT    PATIENT NAME: Troy Mcdaniel, Troy Mcdaniel                      DOB:        [birth year]  MED REC NO:   [id number]                            ROOM:  ACCOUNT NO:   [id number]                           ADMIT DATE: 11/12/2021  PROVIDER:     Allyn Kenner, MD    DATE OF PROCEDURE:  11/12/2021    PREOPERATIVE DIAGNOSIS:  Left cataract with uncontrolled glaucoma.    POSTOPERATIVE DIAGNOSIS:  Left cataract with uncontrolled glaucoma.    PROCEDURE:  Left phacoemulsification with intraocular lens implant and  iStent implantation.    SURGEON:  Allyn Kenner, MD    ANESTHESIA OBTAINED:  Local.    EBL:  NONE    DESCRIPTION OF PROCEDURE:  The patient was brought into the operating  room.  The operative eye was marked, which was the left eye.  The left  eye was then prepped with a full-strength Betadine preparation.  The  periorbital area was copiously washed with Betadine.  The lashes were  washed with Betadine.  Dilute Betadine was then also put in the inferior  and superior fornices and left in place for approximately a minute or 2.  This was then irrigated with sterile water.  The face was then wiped and  the preparation was repeated 1 more time.  The drape was then placed  over the operative eye with the sticky adhesive placed at the lid  margins so that it draped the lashes out of the operative field  superiorly and inferiorly, as well as isolated the meibomian glands  behind the drape.  This cleared the operative field of any meibomian  gland secretions and eyelashes.    Next, a lid speculum was positioned in the left eye.  A super sharp  blade was used to make a side-port incision at the 2 o'clock position.   A clear corneal incision was fashioned over the 12 o'clock meridian with  a 3.2-mm keratome at the limbus.  Healon was used to reform the anterior  chamber.  A  continuous tear anterior capsulotomy was then performed with  a bent needle cystotome.  Hydrodissection was performed by irrigating  with balanced salt solution through a syringe underneath the anterior  capsular flap to loosen and allow free rotation of the lens nucleus.   Phacoemulsification was used and the entire lens nucleus was removed.   The I&A unit was instilled in the eye, and the remaining cortex was  removed.  Healon was used to separate the anterior and posterior  capsular flaps.  The DCB00 +17.0 diopter lens was then instilled into  the capsular bag.  The anterior chamber was then re-deepened using  Healon and using a temporal approach, the iStent inject G2-W was placed  in  the trabecular meshwork at the 7 o'clock and 10 o'clock position.   Healon was then removed from in front and behind the lens.  The wounds  were checked and found to be airtight and watertight.  The lid speculum  was removed.  The drape was removed and the patient was brought to the  recovery room in excellent condition and discharged with postop  instructions in excellent condition.        Allyn Kenner, MD    D: 11/12/2021 10:51:30       T: 11/12/2021 10:54:26     [email or web address]  Job#: [id number]     Doc#: [id number]    CC:

## 2021-11-12 NOTE — Discharge Instructions (Signed)
Cataract Post-Op Instructions  Myer Peer., M.D.  [phone]  [phone]    Dr. Richarda Blade has used the most modern surgical techniques during your cataract extraction; therefore, there are few restrictions.    You may move your eye in any direction, watch TV, read, cook dinner, clean house, and go up and down steps.    There are no physical restrictions, though you should avoid extreme exertion, do not drive day of surgery, and avoid swimming for three weeks.    We make every attempt to provide a pain-free procedure.  At times you may notice a dull headache or even a sharp pain.  This is normal.  Should the discomfort persist, please call the office.    If you see any flashes of light, floaters, or a black cloud over the eyes, call the office immediately.    The vision in the operated eye will be blurry at first. Be patient. Your vision will improve dramatically over the next few days. You will see glares and halos around lights for the first day or so. This is a result of the dilating drops used for the surgery. You may also notice red or pink tinged vision. This is normal and will go away in a few days.    Your eye will continue to heal over the next two to three weeks. At the end of the healing time you will see Dr. Richarda Blade in the office to check your vision and determine your new glasses prescription.    Resume regular diet and medications (unless otherwise instructed by your doctor).    Medicine drops will be necessary to ensure as rapid healing as possible.  These include:    Vigamox one drop three times a day  Nevanac one drop three times a day   Pred Forte one drop three times a day    Your post-op visit will be __5/24/23________ at ____8am______ at the office.                                                Date                 Time    Your satisfaction is our primary concern.  If you have any questions, please contact the office.  It is our pleasure to be your choice in eye care.    ***DO  NOT RUB OR TOUCH THE OPERATIVE EYE***         Infection After Surgery: Care Instructions  Overview  After surgery, an infection is always possible. It doesn't mean that the surgery didn't go well.  Because an infection can be serious, your doctor has taken steps to manage it.  Your doctor checked the infection and cleaned it if necessary. Your doctor may have made an opening in the area so that the pus can drain out. You may have gauze in the cut so that the area will stay open and keep draining. You may need antibiotics.  You will need to follow up with your doctor to make sure the infection has gone away.  Follow-up care is a key part of your treatment and safety. Be sure to make and go to all appointments, and call your doctor if you are having problems. It's also a good idea to know your test results and keep a list of  the medicines you take.  How can you care for yourself at home?  Make sure your surgeon knows about the infection, especially if you saw another doctor about your symptoms.  If your doctor prescribed antibiotics, take them as directed. Do not stop taking them just because you feel better. You need to take the full course of antibiotics.  Ask your doctor if you can take an over-the-counter pain medicine, such as acetaminophen (Tylenol), ibuprofen (Advil, Motrin), or naproxen (Aleve). Be safe with medicines. Read and follow all instructions on the label.  Do not take two or more pain medicines at the same time unless the doctor told you to. Many pain medicines have acetaminophen, which is Tylenol. Too much acetaminophen (Tylenol) can be harmful.  Prop up the area on a pillow anytime you sit or lie down during the next 3 days. Try to keep it above the level of your heart. This will help reduce swelling.  Keep the skin clean and dry.  You may have a bandage over the cut (incision). A bandage helps the incision heal and protects it. Your doctor will tell you how to take care of this. Keep it clean and  dry. You may have drainage from the wound.  If your doctor told you how to care for your incision, follow your doctor's instructions. If you did not get instructions, follow this general advice:  Wash around the incision with clean water 2 times a day. Don't use hydrogen peroxide or alcohol, which can slow healing.  When should you call for help?   Call your doctor now or seek immediate medical care if:    You have signs that your infection is getting worse, such as:  Increased pain, swelling, warmth, or redness in the area.  Red streaks leading from the area.  Pus draining from the wound.  A new or higher fever.   Watch closely for changes in your health, and be sure to contact your doctor if you have any problems.  Where can you learn more?  Go to [employer] and enter C340 to learn more about "Infection After Surgery: Care Instructions."  Current as of: May 22, 2021               Content Version: 13.6   2006-2023 [employer].   Care instructions adapted under license by [facility]. If you have questions about a medical condition or this instruction, always ask your healthcare professional. [employer] disclaims any warranty or liability for your use of this information.         Nausea and Vomiting After Surgery: Care Instructions  Your Care Instructions     After you've had surgery, you may feel sick to your stomach (nauseated) or you may vomit. Sometimes anesthesia can make you feel sick. It's a common side effect and often doesn't last long. Pain also can make you feel sick or vomit. After the anesthesia wears off, you may feel pain from the incision (cut). That pain could then upset your stomach. Taking pain medicine can also make you feel sick to your stomach.  Whatever the cause, you may get medicine that can help. There are also some things you can do at home to prevent nausea and feel better.  The doctor has checked you carefully, but problems can  develop later. If you notice any problems or new symptoms, get medical treatment right away.  Follow-up care is a key part of your treatment and safety. Be sure to make and  go to all appointments, and call your doctor if you are having problems. It's also a good idea to know your test results and keep a list of the medicines you take.  How can you care for yourself at home?  Be safe with medicines. Read and follow all instructions on the label.  If the doctor gave you a prescription medicine for pain, take it as prescribed.  If you are not taking a prescription pain medicine, ask your doctor if you can take an over-the-counter medicine.  Take your pain medicine as soon as you have pain. It works better if you take it before the pain gets bad.  Call your doctor if you have any problems with your medicine.  Rest in bed until you feel better.  To prevent dehydration, drink plenty of fluids. Choose water and other clear liquids until you feel better. If you have kidney, heart, or liver disease and have to limit fluids, talk with your doctor before you increase the amount of fluids you drink.  When you are able to eat, try clear soups, mild foods, and liquids until all symptoms are gone for 12 to 48 hours. Other good choices include dry toast, crackers, cooked cereal, and gelatin dessert, such as Jell-O.  Do not smoke. Smoking and being around smoke can make nausea worse. If you need help quitting, talk to your doctor about stop-smoking programs and medicines. These can increase your chances of quitting for good.  When should you call for help?   Call 911  anytime you think you may need emergency care. For example, call if:    You passed out (lost consciousness).   Call your doctor now or seek immediate medical care if:    You have new or worse nausea or vomiting.     You are too sick to your stomach to drink any fluids.     You cannot keep down fluids.     You have symptoms of dehydration, such as:  Dry eyes and a dry  mouth.  Passing only a little urine.  Feeling thirstier than usual.     Your pain medicine is not helping.     You are dizzy or lightheaded, or you feel like you may faint.   Watch closely for changes in your health, and be sure to contact your doctor if:    You do not get better as expected.   Current as of: November 26, 2020               Content Version: 13.6   2006-2023 [employer].   Care instructions adapted under license by [facility]. If you have questions about a medical condition or this instruction, always ask your healthcare professional. [employer] disclaims any warranty or liability for your use of this information.    If any problems occur or if you have any further questions, please call your doctor as soon as possible. If you find that you cannot reach your doctor but feel that your condition needs a doctor's attention go to an emergency room.

## 2021-11-12 NOTE — Anesthesia Post-Procedure Evaluation (Signed)
Department of Anesthesiology  Postprocedure Note    Patient: Troy Mcdaniel  MRN: [id number]  Birthdate: 01-09-1946  Date of evaluation: 11/12/2021      Procedure Summary     Date: 11/12/21 Room / Location: [place] [facility]    Anesthesia Start: [id number] Anesthesia Stop: 0751    Procedure: LEFT CATARACT EXTRACTION WITH IOL AND ISTENT (Left: Eye) Diagnosis:       Combined forms of age-related cataract of left eye      (Combined forms of age-related cataract of left eye [H25.812])    Surgeons: Dorise Bullion, MD Responsible Provider: Roxanne Mins, MD    Anesthesia Type: MAC ASA Status: 3          Anesthesia Type: MAC    Aldrete Phase I: Aldrete Score: 10    Aldrete Phase II: Aldrete Score: 10      Anesthesia Post Evaluation    Patient location during evaluation: PACU  Patient participation: complete - patient participated  Level of consciousness: awake and alert  Pain score: 3  Airway patency: patent  Nausea & Vomiting: no nausea  Complications: no  Cardiovascular status: blood pressure returned to baseline  Respiratory status: acceptable  Hydration status: euvolemic

## 2021-12-06 ENCOUNTER — Telehealth: Payer: Self-pay

## 2021-12-06 NOTE — Telephone Encounter (Signed)
Pt called needing appt with Dr. Dennard Schaumann for today. Pt stated that he pulled off a tick and thinks that he has infection.   Advice pt we don't have any appt available for a few day, suggest pt go to UC.   Pt voiced that he will go to UC.

## 2021-12-16 ENCOUNTER — Ambulatory Visit (INDEPENDENT_AMBULATORY_CARE_PROVIDER_SITE_OTHER): Payer: Medicare Other | Admitting: Family Medicine

## 2021-12-16 VITALS — BP 115/60 | HR 47 | Temp 97.9°F | Ht 69.0 in | Wt 190.0 lb

## 2021-12-16 DIAGNOSIS — R55 Syncope and collapse: Secondary | ICD-10-CM | POA: Diagnosis not present

## 2021-12-17 LAB — CBC WITH DIFFERENTIAL/PLATELET
Absolute Monocytes: 599 cells/uL (ref 200–950)
Basophils Absolute: 40 cells/uL (ref 0–200)
Basophils Relative: 0.7 %
Eosinophils Absolute: 160 cells/uL (ref 15–500)
Eosinophils Relative: 2.8 %
HCT: 45.2 % (ref 38.5–50.0)
Hemoglobin: 15.1 g/dL (ref 13.2–17.1)
Lymphs Abs: 1613 cells/uL (ref 850–3900)
MCH: 30.6 pg (ref 27.0–33.0)
MCHC: 33.4 g/dL (ref 32.0–36.0)
MCV: 91.5 fL (ref 80.0–100.0)
MPV: 11.4 fL (ref 7.5–12.5)
Monocytes Relative: 10.5 %
Neutro Abs: 3289 cells/uL (ref 1500–7800)
Neutrophils Relative %: 57.7 %
Platelets: 217 10*3/uL (ref 140–400)
RBC: 4.94 10*6/uL (ref 4.20–5.80)
RDW: 12.6 % (ref 11.0–15.0)
Total Lymphocyte: 28.3 %
WBC: 5.7 10*3/uL (ref 3.8–10.8)

## 2021-12-17 LAB — BASIC METABOLIC PANEL WITH GFR
BUN: 20 mg/dL (ref 7–25)
CO2: 29 mmol/L (ref 20–32)
Calcium: 9.2 mg/dL (ref 8.6–10.3)
Chloride: 107 mmol/L (ref 98–110)
Creat: 1.21 mg/dL (ref 0.70–1.28)
Glucose, Bld: 96 mg/dL (ref 65–99)
Potassium: 4.7 mmol/L (ref 3.5–5.3)
Sodium: 142 mmol/L (ref 135–146)
eGFR: 63 mL/min/{1.73_m2} (ref >=60)

## 2022-01-03 ENCOUNTER — Other Ambulatory Visit: Payer: Self-pay | Admitting: Cardiovascular Disease

## 2022-01-27 ENCOUNTER — Other Ambulatory Visit: Payer: Self-pay | Admitting: Cardiovascular Disease

## 2022-02-25 ENCOUNTER — Other Ambulatory Visit: Payer: Self-pay | Admitting: Cardiovascular Disease

## 2022-03-09 NOTE — Progress Notes (Unsigned)
Cardiology Clinic Note   Patient Name: Theodore Davis Date of Encounter: 03/11/2022  Primary Care Provider:  Susy Frizzle, MD Primary Cardiologist:  Shelva Majestic, MD  Patient Profile    Theodore Davis 76 year old male presents to the clinic today for follow-up evaluation of his hyperlipidemia.  Past Medical History    Past Medical History:  Diagnosis Date   Arthritis 10/03/2010   right sternoclavicular joint   Heart murmur    Hemangioma of spine    lower lumbar   HOH (hard of hearing)    R ear   Hyperlipidemia    hypercholesterolemia   Lyme disease    Past Surgical History:  Procedure Laterality Date   BIOPSY  08/28/2021   Procedure: BIOPSY;  Surgeon: Harvel Quale, MD;  [facility]: [facility];  Service: Gastroenterology;;   CARDIOVASCULAR STRESS TEST  08/2010   CHOLECYSTECTOMY     COLONOSCOPY WITH PROPOFOL N/A 08/28/2021   Procedure: COLONOSCOPY WITH PROPOFOL;  Surgeon: Harvel Quale, MD;  [facility];  Service: Gastroenterology;  Laterality: N/A;  1045  ASA 1   ENDOSCOPIC MUCOSAL RESECTION  09/18/2021   Procedure: ENDOSCOPIC MUCOSAL RESECTION;  Surgeon: Rush Landmark Telford Nab., MD;  Location: [facility];  Service: Gastroenterology;;   EUS N/A 09/18/2021   Procedure: LOWER ENDOSCOPIC ULTRASOUND (EUS);  Surgeon: Irving Copas., MD;  Location: [facility];  Service: Gastroenterology;  Laterality: N/A;   FLEXIBLE SIGMOIDOSCOPY N/A 09/18/2021   Procedure: FLEXIBLE SIGMOIDOSCOPY;  Surgeon: Rush Landmark Telford Nab., MD;  Location: [facility];  Service: Gastroenterology;  Laterality: N/A;   HEMOSTASIS CLIP PLACEMENT  09/18/2021   Procedure: HEMOSTASIS CLIP PLACEMENT;  Surgeon: Rush Landmark Telford Nab., MD;  Location: [facility];  Service: Gastroenterology;;   HERNIA REPAIR     R inguinal   JOINT REPLACEMENT Right    MASTOID DEBRIDEMENT     R ear   POLYPECTOMY  08/28/2021   Procedure: POLYPECTOMY;  Surgeon: Harvel Quale, MD;  [facility];  Service: Gastroenterology;;   POLYPECTOMY  09/18/2021   Procedure: POLYPECTOMY;  Surgeon: Irving Copas., MD;  Location: [facility];  Service: Gastroenterology;;   RESECTION DISTAL CLAVICAL Left 11/30/2014   Procedure: RESECTION DISTAL CLAVICAL;  Surgeon: Kathryne Hitch, MD;  Location: [place];  Service: Orthopedics;  Laterality: Left;   SHOULDER ARTHROSCOPY WITH BICEPSTENOTOMY Left 11/30/2014   Procedure: SHOULDER ARTHROSCOPY WITH BICEPSTENOTOMY;  Surgeon: Kathryne Hitch, MD;  Location: [place];  Service: Orthopedics;  Laterality: Left;   SHOULDER ARTHROSCOPY WITH ROTATOR CUFF REPAIR AND SUBACROMIAL DECOMPRESSION Left 11/30/2014   Procedure: LEFT SHOULDER ARTHROSCOPY WITH DISTAL CLAVICAL EXCISION, ACROMIOPLASTY, ROTATOR CUFF REPAIR , BICEPS TENOTOMY;  Surgeon: Kathryne Hitch, MD;  Location: [place];  Service: Orthopedics;  Laterality: Left;   SUBMUCOSAL LIFTING INJECTION  09/18/2021   Procedure: SUBMUCOSAL LIFTING INJECTION;  Surgeon: Irving Copas., MD;  Location: [facility];  Service: Gastroenterology;;   TENDON REPAIR  2005   R elbow   TOTAL KNEE ARTHROPLASTY  05/28/2011   Procedure: TOTAL KNEE ARTHROPLASTY;  Surgeon: Ninetta Lights, MD;  Location: [place];  Service: Orthopedics;  Laterality: Right;  Right total knee arthroplasty    US ECHOCARDIOGRAPHY  08/2010    Allergies  Allergies  Allergen Reactions   Codeine Nausea And Vomiting   Lipitor [Atorvastatin Calcium] Other (See Comments)    myalgias    History of Present Illness    Theodore Davis has a PMH  of mediastinal adenopathy, BPH, hyperlipidemia, shortness of breath, seasonal allergies, family history of heart disease, RLS, snoring, and obesity.  He underwent nuclear stress testing which showed normal perfusion and function.  His echo cardiogram 2012 showed normal LVEF.  His repeat echo cardiogram 8/20 showed normal LV  function 50-60% with G1 DD and mild mitral annular calcification with mild aortic sclerosis with no stenosis.  He developed a COVID-19 infection November/December 2021.  He lost his sense of taste and smell.  He received the [employer] vaccination.    He was seen in follow-up by Dr. Claiborne Billings on 12/27/2020.  He had been caring for his father-in-law who had a stroke.  He denied chest pain.  His blood pressure was well controlled.  He denied chest pain or shortness of breath.  He did note some rare episodes of dizziness.  He reported compliance with his simvastatin, finasteride, and silodosin.  He was seen by his PCP on 12/16/2021 for an episode of near syncope.It was felt his symptoms were related to/caused by hypotension in the setting of bradycardia and dehydration.  It was recommended that he increase his p.o. hydration and hold his alpha-blocker.  His EKG showed sinus bradycardia 59 bpm with leftward axis deviation.  His blood work was unremarkable.  He presents to the clinic today for follow-up evaluation states he continues to feel well.  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  His blood pressure is well controlled at home.  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  He continues to have poor air heads occasionally.  He does note some shortness of breath but attributes this to his COPD.  He also enjoys spending time with his 3 grandchildren.  I will request his most recent labs from his PCP, given salty 6 diet sheet, and plan follow-up in 6 to 9 months.  Today he denies chest pain, increased shortness of breath, lower extremity edema, fatigue, palpitations, melena, hematuria, hemoptysis, diaphoresis, weakness, presyncope, syncope, orthopnea, and PND.    Home Medications    Prior to Admission medications   Medication Sig Start Date End Date Taking? Authorizing Provider  Magnesium Oxide 200 MG TABS Take 400 mg by mouth at bedtime.     [provider]  silodosin (RAPAFLO) 8 MG CAPS capsule Take 1 capsule (8 mg total) by mouth every other day 05/02/21     simvastatin (ZOCOR) 40 MG tablet TAKE 1 TAB BY MOUTH DAILY AT 6 PM. NEEDS TO SCHEDULE APPOINTMENT BEFORE RECEIVING FUTURE REFILLS. 02/25/22   Troy Sine, MD  promethazine (PHENERGAN) 25 MG tablet Take 1 tablet (25 mg total) by mouth every 8 (eight) hours as needed for nausea or vomiting. 08/21/20 09/13/20  Susy Frizzle, MD    Family History    Family History  Problem Relation Age of Onset   Heart disease Mother    Atrial fibrillation Mother    Heart disease Father    Rheum arthritis Father    Bladder Cancer Father    Atrial fibrillation Father    Brain cancer Maternal Uncle    Lung disease Neg Hx    He indicated that his mother is deceased. He indicated that his father is deceased. He indicated that his sister is alive. He indicated that the status of his maternal uncle is unknown. He indicated that the status of his neg hx is unknown.  Social History    Social History   Socioeconomic  History   Marital status: Married    Spouse name: Not on file   Number of children: 2   Years of education: Not on file   Highest education level: Not on file  Occupational History   Not on file  Tobacco Use   Smoking status: Former    Packs/day: 0.25    Years: 50.00    Total pack years: 12.50    Types: Cigarettes    Quit date: 06/23/1997    Years since quitting: 24.7   Smokeless tobacco: Former  Scientific laboratory technician Use: Never used  Substance and Sexual Activity   Alcohol use: Yes    Comment: occasional beer   Drug use: No   Sexual activity: Yes  Other Topics Concern   Not on file  Social History Narrative   [facility] (04/08/17):   Originally from [facility]. Has lived in [place] as well. Previously worked for the Education officer, environmental as a Dealer. Has 2 dogs & a rabbit currently. No bird exposure. He reports black mold was found in his air conditioner at  [facility] when it was changed out 2 years ago. No hot tub exposure. Enjoys fishing.    Married since 1974.    2 sons   3 grandchildren.   Social Determinants of Health   Financial Resource Strain: Low Risk  (05/31/2021)   Overall Financial Resource Strain (CARDIA)    Difficulty of Paying Living Expenses: Not hard at all  Food Insecurity: No Food Insecurity (05/31/2021)   Hunger Vital Sign    Worried About Running Out of Food in the Last Year: Never true    Ran Out of Food in the Last Year: Never true  Transportation Needs: No Transportation Needs (05/31/2021)   PRAPARE - Hydrologist (Medical): No    Lack of Transportation (Non-Medical): No  Physical Activity: Sufficiently Active (05/31/2021)   Exercise Vital Sign    Days of Exercise per Week: 5 days    Minutes of Exercise per Session: 30 min  Stress: No Stress Concern Present (05/31/2021)   [place]    Feeling of Stress : Not at all  Social Connections: [place] (05/31/2021)   Social Connection and Isolation Panel [NHANES]    Frequency of Communication with Friends and Family: More than three times a week    Frequency of Social Gatherings with Friends and Family: More than three times a week    Attends Religious Services: More than 4 times per year    Active Member of [employer] or Organizations: Yes    Attends Music therapist: More than 4 times per year    Marital Status: Married  Human resources officer Violence: Not At Risk (05/31/2021)   Humiliation, Afraid, Rape, and Kick questionnaire    Fear of Current or Ex-Partner: No    Emotionally Abused: No    Physically Abused: No    Sexually Abused: No     Review of Systems    General:  No chills, fever, night sweats or weight changes.  Cardiovascular:  No chest pain, dyspnea on exertion, edema, orthopnea, palpitations, paroxysmal nocturnal dyspnea. Dermatological: No  rash, lesions/masses Respiratory: No cough, dyspnea Urologic: No hematuria, dysuria Abdominal:   No nausea, vomiting, diarrhea, bright red blood per rectum, melena, or hematemesis Neurologic:  No visual changes, wkns, changes in mental status. All other systems reviewed and are otherwise negative except as noted above.  Physical Exam  VS:  BP 138/70   Pulse 61   Ht '5\' 9"'$  (1.753 m)   Wt 192 lb 12.8 oz (87.5 kg)   SpO2 97%   BMI 28.47 kg/m  , BMI Body mass index is 28.47 kg/m. GEN: Well nourished, well developed, in no acute distress. HEENT: normal. Neck: Supple, no JVD, carotid bruits, or masses. Cardiac: RRR, no murmurs, rubs, or gallops. No clubbing, cyanosis, edema.  Radials/DP/PT 2+ and equal bilaterally.  Respiratory:  Respirations regular and unlabored, clear to auscultation bilaterally. GI: Soft, nontender, nondistended, BS + x 4. MS: no deformity or atrophy. Skin: warm and dry, no rash. Neuro:  Strength and sensation are intact. Psych: Normal affect.  Accessory Clinical Findings    Recent Labs: 12/16/2021: BUN 20; Creat 1.21; Hemoglobin 15.1; Platelets 217; Potassium 4.7; Sodium 142   Recent Lipid Panel    Component Value Date/Time   CHOL 150 09/21/2019 1016   CHOL 156 08/09/2018 0923   TRIG 72 09/21/2019 1016   HDL 58 09/21/2019 1016   HDL 57 08/09/2018 0923   CHOLHDL 2.6 09/21/2019 1016   VLDL 16 12/29/2016 0951   LDLCALC 77 09/21/2019 1016         ECG personally reviewed by me today-none today.  Echocardiogram 01/21/2021  IMPRESSIONS     1. Left ventricular ejection fraction, by estimation, is 65 to 70%. Left  ventricular ejection fraction by 3D volume is 70 %. The left ventricle has  normal function. The left ventricle has no regional wall motion  abnormalities. Left ventricular diastolic   parameters are consistent with Grade I diastolic dysfunction (impaired  relaxation).   2. Right ventricular systolic function is normal. The right  ventricular  size is normal. There is normal pulmonary artery systolic pressure.   3. The mitral valve is normal in structure. Trivial mitral valve  regurgitation. No evidence of mitral stenosis.   4. The aortic valve is tricuspid. There is mild thickening of the aortic  valve. Aortic valve regurgitation is not visualized. Mild aortic valve  sclerosis is present, with no evidence of aortic valve stenosis.   Comparison(s): A prior study was performed on 02/04/2019. No significant  change from prior study.  Assessment & Plan   1.  Hyperlipidemia-LDL 77 on 09/21/19. Continue simvastatin Heart healthy low-sodium diet-salty 6 given Increase physical activity as tolerated Request labs from PCP  Mediastinal adenopathy-echocardiogram 01/21/2021 showed an EF of 65-70 percent, G1 DD, trivial mitral valve regurgitation, and no significant changes from previous study.  Blood pressure continues to be well controlled at home. We will plan for repeat echocardiogram 8/24  Near syncope-denies further episodes.  Maintaining p.o. hydration. Continue to change positions slowly, maintain p.o. hydration Heart healthy low-sodium diet Increase physical activity as tolerated   Aortic atherosclerosis-no increased DOE or activity intolerance.  Echocardiogram 01/21/2021 showed normal LVEF, G1 DD, and mild aortic valve sclerosis with no evidence of stenosis. Repeat echocardiogram 8/24  Disposition: Follow-up with Dr. Claiborne Billings in 6-9 months.   Jossie Ng. Aasia Peavler NP-C     03/11/2022, 2:11 PM [facility] [address] Office [phone] Fax [phone]  Notice: This dictation was prepared with Dragon dictation along with smaller phrase technology. Any transcriptional errors that result from this process are unintentional and may not be corrected upon review.  I spent 14 minutes examining this patient, reviewing medications, and using patient centered shared decision making  involving her cardiac care.  Prior to her visit I spent greater than 20  minutes reviewing her past medical history,  medications, and prior cardiac tests.

## 2022-03-11 ENCOUNTER — Ambulatory Visit: Payer: Medicare Other | Attending: General Practice | Admitting: General Practice

## 2022-03-11 ENCOUNTER — Encounter: Payer: Self-pay | Admitting: General Practice

## 2022-03-11 VITALS — BP 138/70 | HR 61 | Ht 69.0 in | Wt 192.8 lb

## 2022-03-11 DIAGNOSIS — I358 Other nonrheumatic aortic valve disorders: Secondary | ICD-10-CM | POA: Diagnosis not present

## 2022-03-11 DIAGNOSIS — R55 Syncope and collapse: Secondary | ICD-10-CM | POA: Diagnosis not present

## 2022-03-11 DIAGNOSIS — R590 Localized enlarged lymph nodes: Secondary | ICD-10-CM

## 2022-03-11 DIAGNOSIS — E785 Hyperlipidemia, unspecified: Secondary | ICD-10-CM

## 2022-03-11 NOTE — Patient Instructions (Signed)
Medication Instructions:  The current medical regimen is effective;  continue present plan and medications as directed. Please refer to the Current Medication list given to you today.   *If you need a refill on your cardiac medications before your next appointment, please call your pharmacy*   Lab Work: NONE If you have labs (blood work) drawn today and your tests are completely normal, you will receive your results only by: [place] (if you have MyChart) OR A paper copy in the mail If you have any lab test that is abnormal or we need to change your treatment, we will call you to review the results.   Follow-Up: At [facility], you and your health needs are our priority.  As part of our continuing mission to provide you with exceptional heart care, we have created designated Provider Care Teams.  These Care Teams include your primary Cardiologist (physician) and Advanced Practice Providers (APPs -  Physician Assistants and Nurse Practitioners) who all work together to provide you with the care you need, when you need it.  We recommend signing up for the patient portal called "MyChart".  Sign up information is provided on this After Visit Summary.  MyChart is used to connect with patients for Virtual Visits (Telemedicine).  Patients are able to view lab/test results, encounter notes, upcoming appointments, etc.  Non-urgent messages can be sent to your provider as well.   To learn more about what you can do with MyChart, go to [email or web address].    Your next appointment:   6-9 month(s)  The format for your next appointment:   In Person  Provider:   Shelva Majestic, MD    Other Instructions PLEASE READ AND FOLLOW SALTY 6-ATTACHED  PLEASE MAINTAIN PHYSICAL ACTIVITY  Important Information About Sugar

## 2022-05-07 ENCOUNTER — Other Ambulatory Visit: Payer: Medicare Other

## 2022-05-22 ENCOUNTER — Telehealth: Payer: Self-pay | Admitting: Cardiovascular Disease

## 2022-05-22 NOTE — Telephone Encounter (Signed)
*  STAT* If patient is at the pharmacy, call can be transferred to refill team.   1. Which medications need to be refilled? (please list name of each medication and dose if known)   simvastatin (ZOCOR) 40 MG tablet   2. Which pharmacy/location (including street and city if local pharmacy) is medication to be sent to?  [facility] ([place], [address]  [phone]  3. Do they need a 30 day or 90 day supply? 90 day  Patient stated he has 2 tablets left.

## 2022-05-23 MED ORDER — SIMVASTATIN 40 MG PO TABS: ORAL_TABLET | ORAL | 3 refills | Status: DC

## 2022-06-11 ENCOUNTER — Ambulatory Visit: Payer: Medicare Other | Admitting: Family Medicine

## 2022-06-24 ENCOUNTER — Ambulatory Visit (INDEPENDENT_AMBULATORY_CARE_PROVIDER_SITE_OTHER): Payer: Medicare Other

## 2022-06-24 VITALS — BP 128/74 | Ht 69.0 in | Wt 198.0 lb

## 2022-06-24 DIAGNOSIS — Z0000 Encounter for general adult medical examination without abnormal findings: Secondary | ICD-10-CM

## 2022-06-24 NOTE — Progress Notes (Signed)
Subjective:   Theodore Davis is a 77 y.o. male who presents for Medicare Annual/Subsequent preventive examination.  Review of Systems     Cardiac Risk Factors include: advanced age (>40mn, >>63women);male gender;dyslipidemia     Objective:    Today's Vitals   06/24/22 1222  BP: 128/74  Weight: 198 lb (89.8 kg)  Height: '5\' 9"'$  (1.753 m)   Body mass index is 29.24 kg/m.     06/24/2022   12:56 PM 09/18/2021    7:57 AM 08/28/2021    9:07 AM 08/22/2021    1:56 PM 07/18/2021    2:33 PM 05/31/2021    9:56 AM 05/18/2017    8:23 PM  Advanced Directives  Does Patient Have a Medical Advance Directive? Yes Yes No No No Yes Yes  Type of Advance Directive Living will;Healthcare Power of Attorney Living will    HToolLiving will Living will  Does patient want to make changes to medical advance directive? No - Patient declined      Yes (Inpatient - patient defers changing a medical advance directive at this time)  Copy of [place] Chart? No - copy requested     No - copy requested   Would patient like information on creating a medical advance directive?   No - Patient declined No - Patient declined No - Patient declined      Current Medications (verified) Outpatient Encounter Medications as of 06/24/2022  Medication Sig   Magnesium Oxide 200 MG TABS Take 400 mg by mouth at bedtime.   silodosin (RAPAFLO) 8 MG CAPS capsule Take 1 capsule (8 mg total) by mouth every other day   simvastatin (ZOCOR) 40 MG tablet TAKE 1 TAB BY MOUTH DAILY AT 6 PM.   [DISCONTINUED] promethazine (PHENERGAN) 25 MG tablet Take 1 tablet (25 mg total) by mouth every 8 (eight) hours as needed for nausea or vomiting.   No facility-administered encounter medications on file as of 06/24/2022.    Allergies (verified) Codeine and Lipitor [atorvastatin calcium]   History: Past Medical History:  Diagnosis Date   Arthritis 10/03/2010   right sternoclavicular joint   Heart murmur     Hemangioma of spine    lower lumbar   HOH (hard of hearing)    R ear   Hyperlipidemia    hypercholesterolemia   Lyme disease    Past Surgical History:  Procedure Laterality Date   BIOPSY  08/28/2021   Procedure: BIOPSY;  Surgeon: CHarvel Quale MD;  [facility];  Service: Gastroenterology;;   CARDIOVASCULAR STRESS TEST  08/2010   CHOLECYSTECTOMY     COLONOSCOPY WITH PROPOFOL N/A 08/28/2021   Procedure: COLONOSCOPY WITH PROPOFOL;  Surgeon: CHarvel Quale MD;  [facility];  Service: Gastroenterology;  Laterality: N/A;  1045  ASA 1   ENDOSCOPIC MUCOSAL RESECTION  09/18/2021   Procedure: ENDOSCOPIC MUCOSAL RESECTION;  Surgeon: MRush LandmarkGTelford Nab, MD;  [facility];  Service: Gastroenterology;;   EUS N/A 09/18/2021   Procedure: LOWER ENDOSCOPIC ULTRASOUND (EUS);  Surgeon: MIrving Copas, MD;  [facility];  Service: Gastroenterology;  Laterality: N/A;   FLEXIBLE SIGMOIDOSCOPY N/A 09/18/2021   Procedure: FLEXIBLE SIGMOIDOSCOPY;  Surgeon: MRush LandmarkGTelford Nab, MD;  [facility];  Service: Gastroenterology;  Laterality: N/A;   HEMOSTASIS CLIP PLACEMENT  09/18/2021   Procedure: HEMOSTASIS CLIP PLACEMENT;  Surgeon: MIrving Copas, MD;  [facility];  Service: Gastroenterology;;   HERNIA REPAIR  R inguinal   JOINT REPLACEMENT Right    MASTOID DEBRIDEMENT     R ear   POLYPECTOMY  08/28/2021   Procedure: POLYPECTOMY;  Surgeon: Harvel Quale, MD;  [facility];  Service: Gastroenterology;;   POLYPECTOMY  09/18/2021   Procedure: POLYPECTOMY;  Surgeon: Irving Copas., MD;  [facility];  Service: Gastroenterology;;   RESECTION DISTAL CLAVICAL Left 11/30/2014   Procedure: RESECTION DISTAL CLAVICAL;  Surgeon: Kathryne Hitch, MD;  Location: [place];  Service: Orthopedics;  Laterality: Left;   SHOULDER ARTHROSCOPY WITH BICEPSTENOTOMY Left  11/30/2014   Procedure: SHOULDER ARTHROSCOPY WITH BICEPSTENOTOMY;  Surgeon: Kathryne Hitch, MD;  Location: [place];  Service: Orthopedics;  Laterality: Left;   SHOULDER ARTHROSCOPY WITH ROTATOR CUFF REPAIR AND SUBACROMIAL DECOMPRESSION Left 11/30/2014   Procedure: LEFT SHOULDER ARTHROSCOPY WITH DISTAL CLAVICAL EXCISION, ACROMIOPLASTY, ROTATOR CUFF REPAIR , BICEPS TENOTOMY;  Surgeon: Kathryne Hitch, MD;  Location: [place];  Service: Orthopedics;  Laterality: Left;   SUBMUCOSAL LIFTING INJECTION  09/18/2021   Procedure: SUBMUCOSAL LIFTING INJECTION;  Surgeon: Irving Copas., MD;  [facility];  Service: Gastroenterology;;   TENDON REPAIR  2005   R elbow   TOTAL KNEE ARTHROPLASTY  05/28/2011   Procedure: TOTAL KNEE ARTHROPLASTY;  Surgeon: Ninetta Lights, MD;  Location: [place];  Service: Orthopedics;  Laterality: Right;  Right total knee arthroplasty    US ECHOCARDIOGRAPHY  08/2010   Family History  Problem Relation Age of Onset   Heart disease Mother    Atrial fibrillation Mother    Heart disease Father    Rheum arthritis Father    Bladder Cancer Father    Atrial fibrillation Father    Brain cancer Maternal Uncle    Lung disease Neg Hx    Social History   Socioeconomic History   Marital status: Married    Spouse name: Not on file   Number of children: 2   Years of education: Not on file   Highest education level: Not on file  Occupational History   Not on file  Tobacco Use   Smoking status: Former    Packs/day: 0.25    Years: 50.00    Total pack years: 12.50    Types: Cigarettes    Quit date: 06/23/1997    Years since quitting: 25.0   Smokeless tobacco: Former  Scientific laboratory technician Use: Never used  Substance and Sexual Activity   Alcohol use: Yes    Comment: occasional beer   Drug use: No   Sexual activity: Yes  Other Topics Concern   Not on file  Social History Narrative   [facility] (04/08/17):   Originally from [facility].  Has lived in [place] as well. Previously worked for the Education officer, environmental as a Dealer. Has 2 dogs & a rabbit currently. No bird exposure. He reports black mold was found in his air conditioner at [facility] when it was changed out 2 years ago. No hot tub exposure. Enjoys fishing.    Married since 1974.    2 sons   3 grandchildren.   Social Determinants of Health   Financial Resource Strain: Low Risk  (06/24/2022)   Overall Financial Resource Strain (CARDIA)    Difficulty of Paying Living Expenses: Not hard at all  Food Insecurity: No Food Insecurity (06/24/2022)   Hunger Vital Sign    Worried About Running Out of Food in the Last Year: Never true    Ran  Out of Food in the Last Year: Never true  Transportation Needs: No Transportation Needs (06/24/2022)   PRAPARE - Hydrologist (Medical): No    Lack of Transportation (Non-Medical): No  Physical Activity: Unknown (06/24/2022)   Exercise Vital Sign    Days of Exercise per Week: 5 days    Minutes of Exercise per Session: Not on file  Stress: No Stress Concern Present (06/24/2022)   [place]    Feeling of Stress : Not at all  Social Connections: [place] (06/24/2022)   Social Connection and Isolation Panel [NHANES]    Frequency of Communication with Friends and Family: More than three times a week    Frequency of Social Gatherings with Friends and Family: Three times a week    Attends Religious Services: More than 4 times per year    Active Member of Clubs or Organizations: Yes    Attends Music therapist: More than 4 times per year    Marital Status: Married    Tobacco Counseling Counseling given: Not Answered   Clinical Intake:  Pre-visit preparation completed: Yes  Pain : No/denies pain  Diabetes: No  How often do you need to have someone help you when you read instructions, pamphlets, or other written materials from  your doctor or pharmacy?: 1 - Never  Diabetic?No   Interpreter Needed?: No  Information entered by :: Denman George LPN   Activities of Daily Living    06/24/2022   12:56 PM 08/22/2021    1:57 PM  In your present state of health, do you have any difficulty performing the following activities:  Hearing? 0   Vision? 0   Difficulty concentrating or making decisions? 0   Walking or climbing stairs? 0   Dressing or bathing? 0   Doing errands, shopping? 0 0  Preparing Food and eating ? N   Using the Toilet? N   In the past six months, have you accidently leaked urine? N   Do you have problems with loss of bowel control? N   Managing your Medications? N   Managing your Finances? N   Housekeeping or managing your Housekeeping? N     Patient Care Team: Susy Frizzle, MD as PCP - General (Family Medicine) Troy Sine, MD as PCP - Cardiology (Cardiology) Susy Frizzle, MD (Family Medicine) Alexis Frock, MD as Consulting Physician (Urology)  Indicate any recent Medical Services you may have received from other than [facility] providers in the past year (date may be approximate).     Assessment:   This is a routine wellness examination for Theodore Davis.  Hearing/Vision screen Hearing Screening - Comments:: Wears bilateral hearing aids  Vision Screening - Comments:: Wears rx glasses - up to date with routine eye exams with Dr. Gershon Crane     Dietary issues and exercise activities discussed: Current Exercise Habits: Home exercise routine, Type of exercise: walking, Time (Minutes): 30, Frequency (Times/Week): 5, Weekly Exercise (Minutes/Week): 150, Intensity: Mild   Goals Addressed             This Visit's Progress    Exercise 3x per week (30 min per time)   On track     Depression Screen    06/24/2022   12:55 PM 05/31/2021    9:50 AM 07/24/2020   11:29 AM 05/30/2020   12:14 PM 09/21/2019   10:04 AM 01/13/2018    9:44 AM 12/29/2016  9:44 AM  PHQ 2/9 Scores  PHQ - 2  Score 0 0 0 0 0 0 0  PHQ- 9 Score     0 0     Fall Risk    06/24/2022   12:52 PM 05/31/2021    9:57 AM 07/24/2020   11:29 AM 05/30/2020   12:14 PM 01/13/2018    9:44 AM  Fall Risk   Falls in the past year? 0 0 0 0 No  Number falls in past yr: 0 0 0 0   Injury with Fall? 0 0 0 0   Risk for fall due to : No Fall Risks No Fall Risks     Follow up Falls evaluation completed;Education provided;Falls prevention discussed Falls prevention discussed Falls evaluation completed Falls evaluation completed     FALL RISK PREVENTION PERTAINING TO THE HOME:  Any stairs in or around the home? Yes  If so, are there any without handrails? No  Home free of loose throw rugs in walkways, pet beds, electrical cords, etc? Yes  Adequate lighting in your home to reduce risk of falls? Yes   ASSISTIVE DEVICES UTILIZED TO PREVENT FALLS:  Life alert? No  Use of a cane, walker or w/c? No  Grab bars in the bathroom? Yes  Shower chair or bench in shower? No  Elevated toilet seat or a handicapped toilet? Yes   TIMED UP AND GO:  Was the test performed? Yes .  Length of time to ambulate 10 feet: 6 sec.   Gait steady and fast without use of assistive device  Cognitive Function:        06/24/2022   12:57 PM 05/31/2021   10:01 AM  6CIT Screen  What Year? 0 points 0 points  What month? 0 points 0 points  What time? 0 points 0 points  Count back from 20 0 points 0 points  Months in reverse 0 points 0 points  Repeat phrase 2 points 2 points  Total Score 2 points 2 points    Immunizations Immunization History  Administered Date(s) Administered   Influenza, High Dose Seasonal PF 04/04/2017   Influenza-Unspecified 04/07/2022   PFIZER(Purple Top)SARS-COV-2 Vaccination 08/14/2019, 09/06/2019, 04/25/2020   Pneumococcal Conjugate-13 10/18/2013   Pneumococcal Polysaccharide-23 10/24/2014   Td 10/17/2010   Tdap 10/17/2010    TDAP status: Due, Education has been provided regarding the importance of this  vaccine. Advised may receive this vaccine at local pharmacy or Health Dept. Aware to provide a copy of the vaccination record if obtained from local pharmacy or Health Dept. Verbalized acceptance and understanding.  Flu Vaccine status: Up to date  Pneumococcal vaccine status: Up to date  Covid-19 vaccine status: Information provided on how to obtain vaccines.   Qualifies for Shingles Vaccine? Yes   Zostavax completed No   Shingrix Completed?: No.    Education has been provided regarding the importance of this vaccine. Patient has been advised to call insurance company to determine out of pocket expense if they have not yet received this vaccine. Advised may also receive vaccine at local pharmacy or Health Dept. Verbalized acceptance and understanding.  Screening Tests Health Maintenance  Topic Date Due   Zoster Vaccines- Shingrix (1 of 2) Never done   DTaP/Tdap/Td (3 - Td or Tdap) 10/16/2020   COVID-19 Vaccine (4 - 2023-24 season) 02/21/2022   Medicare Annual Wellness (AWV)  06/25/2023   COLONOSCOPY (Pts 45-7yr Insurance coverage will need to be confirmed)  08/29/2026   Pneumonia Vaccine 75 Years old  Completed   INFLUENZA VACCINE  Completed   Hepatitis C Screening  Completed   HPV VACCINES  Aged Out    Health Maintenance  Health Maintenance Due  Topic Date Due   Zoster Vaccines- Shingrix (1 of 2) Never done   DTaP/Tdap/Td (3 - Td or Tdap) 10/16/2020   COVID-19 Vaccine (4 - 2023-24 season) 02/21/2022    Colorectal cancer screening: Type of screening: Colonoscopy. Completed 08/28/21. Repeat every 5 years  Lung Cancer Screening: (Low Dose CT Chest recommended if Age 44-80 years, 30 pack-year currently smoking OR have quit w/in 15years.) does not qualify.   Lung Cancer Screening Referral: n/a   Additional Screening:  Hepatitis C Screening: does qualify; Completed 01/13/18  Vision Screening: Recommended annual ophthalmology exams for early detection of glaucoma and other  disorders of the eye. Is the patient up to date with their annual eye exam?  Yes  Who is the provider or what is the name of the office in which the patient attends annual eye exams? Dr. Gershon Crane  If pt is not established with a provider, would they like to be referred to a provider to establish care? No .   Dental Screening: Recommended annual dental exams for proper oral hygiene  Community Resource Referral / Chronic Care Management: CRR required this visit?  No   CCM required this visit?  No      Plan:     I have personally reviewed and noted the following in the patient's chart:   Medical and social history Use of alcohol, tobacco or illicit drugs  Current medications and supplements including opioid prescriptions. Patient is not currently taking opioid prescriptions. Functional ability and status Nutritional status Physical activity Advanced directives List of other physicians Hospitalizations, surgeries, and ER visits in previous 12 months Vitals Screenings to include cognitive, depression, and falls Referrals and appointments  In addition, I have reviewed and discussed with patient certain preventive protocols, quality metrics, and best practice recommendations. A written personalized care plan for preventive services as well as general preventive health recommendations were provided to patient.     Denman George [place], [place]   06/25/7515   Nurse Notes: No concerns

## 2022-06-24 NOTE — Patient Instructions (Addendum)
Theodore Davis , Thank you for taking time to come for your Medicare Wellness Visit. I appreciate your ongoing commitment to your health goals. Please review the following plan we discussed and let me know if I can assist you in the future.   These are the goals we discussed:  Goals      Exercise 3x per week (30 min per time)        This is a list of the screening recommended for you and due dates:  Health Maintenance  Topic Date Due   Zoster (Shingles) Vaccine (1 of 2) Never done   DTaP/Tdap/Td vaccine (3 - Td or Tdap) 10/16/2020   COVID-19 Vaccine (4 - 2023-24 season) 02/21/2022   Medicare Annual Wellness Visit  06/25/2023   Colon Cancer Screening  08/29/2026   Pneumonia Vaccine  Completed   Flu Shot  Completed   Hepatitis C Screening: USPSTF Recommendation to screen - Ages 18-79 yo.  Completed   HPV Vaccine  Aged Out    Advanced directives: Please bring a copy of your health care power of attorney and living will to the office to be added to your chart at your convenience.   Conditions/risks identified: Aim for 30 minutes of exercise or brisk walking, 6-8 glasses of water, and 5 servings of fruits and vegetables each day.   Next appointment: Follow up in one year for your annual wellness visit.   Preventive Care 69 Years and Older, Male  Preventive care refers to lifestyle choices and visits with your health care provider that can promote health and wellness. What does preventive care include? A yearly physical exam. This is also called an annual well check. Dental exams once or twice a year. Routine eye exams. Ask your health care provider how often you should have your eyes checked. Personal lifestyle choices, including: Daily care of your teeth and gums. Regular physical activity. Eating a healthy diet. Avoiding tobacco and drug use. Limiting alcohol use. Practicing safe sex. Taking low doses of aspirin every day. Taking vitamin and mineral supplements as recommended by  your health care provider. What happens during an annual well check? The services and screenings done by your health care provider during your annual well check will depend on your age, overall health, lifestyle risk factors, and family history of disease. Counseling  Your health care provider may ask you questions about your: Alcohol use. Tobacco use. Drug use. Emotional well-being. Home and relationship well-being. Sexual activity. Eating habits. History of falls. Memory and ability to understand (cognition). Work and work Statistician. Screening  You may have the following tests or measurements: Height, weight, and BMI. Blood pressure. Lipid and cholesterol levels. These may be checked every 5 years, or more frequently if you are over 15 years old. Skin check. Lung cancer screening. You may have this screening every year starting at age 62 if you have a 30-pack-year history of smoking and currently smoke or have quit within the past 15 years. Fecal occult blood test (FOBT) of the stool. You may have this test every year starting at age 72. Flexible sigmoidoscopy or colonoscopy. You may have a sigmoidoscopy every 5 years or a colonoscopy every 10 years starting at age 96. Prostate cancer screening. Recommendations will vary depending on your family history and other risks. Hepatitis C blood test. Hepatitis B blood test. Sexually transmitted disease (STD) testing. Diabetes screening. This is done by checking your blood sugar (glucose) after you have not eaten for a while (fasting). You may  have this done every 1-3 years. Abdominal aortic aneurysm (AAA) screening. You may need this if you are a current or former smoker. Osteoporosis. You may be screened starting at age 29 if you are at high risk. Talk with your health care provider about your test results, treatment options, and if necessary, the need for more tests. Vaccines  Your health care provider may recommend certain vaccines,  such as: Influenza vaccine. This is recommended every year. Tetanus, diphtheria, and acellular pertussis (Tdap, Td) vaccine. You may need a Td booster every 10 years. Zoster vaccine. You may need this after age 80. Pneumococcal 13-valent conjugate (PCV13) vaccine. One dose is recommended after age 52. Pneumococcal polysaccharide (PPSV23) vaccine. One dose is recommended after age 40. Talk to your health care provider about which screenings and vaccines you need and how often you need them. This information is not intended to replace advice given to you by your health care provider. Make sure you discuss any questions you have with your health care provider. Document Released: 07/06/2015 Document Revised: 02/27/2016 Document Reviewed: 04/10/2015 Elsevier Interactive Patient Education  2017 Minor Prevention in the Home Falls can cause injuries. They can happen to people of all ages. There are many things you can do to make your home safe and to help prevent falls. What can I do on the outside of my home? Regularly fix the edges of walkways and driveways and fix any cracks. Remove anything that might make you trip as you walk through a door, such as a raised step or threshold. Trim any bushes or trees on the path to your home. Use bright outdoor lighting. Clear any walking paths of anything that might make someone trip, such as rocks or tools. Regularly check to see if handrails are loose or broken. Make sure that both sides of any steps have handrails. Any raised decks and porches should have guardrails on the edges. Have any leaves, snow, or ice cleared regularly. Use sand or salt on walking paths during winter. Clean up any spills in your garage right away. This includes oil or grease spills. What can I do in the bathroom? Use night lights. Install grab bars by the toilet and in the tub and shower. Do not use towel bars as grab bars. Use non-skid mats or decals in the tub or  shower. If you need to sit down in the shower, use a plastic, non-slip stool. Keep the floor dry. Clean up any water that spills on the floor as soon as it happens. Remove soap buildup in the tub or shower regularly. Attach bath mats securely with double-sided non-slip rug tape. Do not have throw rugs and other things on the floor that can make you trip. What can I do in the bedroom? Use night lights. Make sure that you have a light by your bed that is easy to reach. Do not use any sheets or blankets that are too big for your bed. They should not hang down onto the floor. Have a firm chair that has side arms. You can use this for support while you get dressed. Do not have throw rugs and other things on the floor that can make you trip. What can I do in the kitchen? Clean up any spills right away. Avoid walking on wet floors. Keep items that you use a lot in easy-to-reach places. If you need to reach something above you, use a strong step stool that has a grab bar. Keep electrical cords  out of the way. Do not use floor polish or wax that makes floors slippery. If you must use wax, use non-skid floor wax. Do not have throw rugs and other things on the floor that can make you trip. What can I do with my stairs? Do not leave any items on the stairs. Make sure that there are handrails on both sides of the stairs and use them. Fix handrails that are broken or loose. Make sure that handrails are as long as the stairways. Check any carpeting to make sure that it is firmly attached to the stairs. Fix any carpet that is loose or worn. Avoid having throw rugs at the top or bottom of the stairs. If you do have throw rugs, attach them to the floor with carpet tape. Make sure that you have a light switch at the top of the stairs and the bottom of the stairs. If you do not have them, ask someone to add them for you. What else can I do to help prevent falls? Wear shoes that: Do not have high heels. Have  rubber bottoms. Are comfortable and fit you well. Are closed at the toe. Do not wear sandals. If you use a stepladder: Make sure that it is fully opened. Do not climb a closed stepladder. Make sure that both sides of the stepladder are locked into place. Ask someone to hold it for you, if possible. Clearly mark and make sure that you can see: Any grab bars or handrails. First and last steps. Where the edge of each step is. Use tools that help you move around (mobility aids) if they are needed. These include: Canes. Walkers. Scooters. Crutches. Turn on the lights when you go into a dark area. Replace any light bulbs as soon as they burn out. Set up your furniture so you have a clear path. Avoid moving your furniture around. If any of your floors are uneven, fix them. If there are any pets around you, be aware of where they are. Review your medicines with your doctor. Some medicines can make you feel dizzy. This can increase your chance of falling. Ask your doctor what other things that you can do to help prevent falls. This information is not intended to replace advice given to you by your health care provider. Make sure you discuss any questions you have with your health care provider. Document Released: 04/05/2009 Document Revised: 11/15/2015 Document Reviewed: 07/14/2014 Elsevier Interactive Patient Education  2017 [employer].

## 2022-08-07 ENCOUNTER — Encounter: Payer: Self-pay | Admitting: Family Medicine

## 2022-08-07 ENCOUNTER — Ambulatory Visit (INDEPENDENT_AMBULATORY_CARE_PROVIDER_SITE_OTHER): Payer: Medicare Other | Admitting: Family Medicine

## 2022-08-07 VITALS — BP 115/82 | HR 126 | Temp 97.5°F | Ht 69.0 in | Wt 184.0 lb

## 2022-08-07 DIAGNOSIS — J019 Acute sinusitis, unspecified: Secondary | ICD-10-CM

## 2022-08-07 DIAGNOSIS — R000 Tachycardia, unspecified: Secondary | ICD-10-CM | POA: Diagnosis not present

## 2022-08-07 DIAGNOSIS — B9689 Other specified bacterial agents as the cause of diseases classified elsewhere: Secondary | ICD-10-CM | POA: Diagnosis not present

## 2022-08-07 MED ORDER — AMOXICILLIN-POT CLAVULANATE 500-125 MG PO TABS: 1.0000 | ORAL_TABLET | Freq: Two times a day (BID) | ORAL | 0 refills | Status: AC

## 2022-08-07 NOTE — Assessment & Plan Note (Signed)
Symptoms consistent with acute bacterial sinusitis. Will treat with Augmentin 500-160m TID for 10 days. Instructed to return to office if symptoms persist or worsen. He is tachycardic at today's visit without chest pain, lightheadedness, palpitations, shortness of breath. EKG showed sinus arrhythmia, other findings consistent with prior EKGs. Patient has been taking Sudafed for his symptoms, instructed to discontinue this. Will monitor his HR at home as he does have a pulse ox and return to office in 1 week for recheck. Instructed to seek medical care for chest pain, palpitations, SOB, lightheadedness or if HR does not return to <100.

## 2022-08-07 NOTE — Progress Notes (Signed)
Acute Office Visit  Subjective:     Patient ID: Theodore Davis, male    DOB: December 08, 1945, 77 y.o.   MRN: [id number]  Chief Complaint  Patient presents with   Acute Visit    sinus infection/upper respiratory issues. Sx: cough, headache, congestion, mucous is green and brown. Neg COVID test result 08/06/22 - J[facility]\\      HPI Patient is in today for 5-6 days of sinus pressure, congestion, runny nose, mucopurulent cough, and loss of taste. Denies fever, malaise, shortness of breath, chest pain, N/V/D Covid test negative at home today and no known sick exposures.   Review of Systems  All other systems reviewed and are negative.   Past Medical History:  Diagnosis Date   Arthritis 10/03/2010   right sternoclavicular joint   Heart murmur    Hemangioma of spine    lower lumbar   HOH (hard of hearing)    R ear   Hyperlipidemia    hypercholesterolemia   Lyme disease    Past Surgical History:  Procedure Laterality Date   BIOPSY  08/28/2021   Procedure: BIOPSY;  Surgeon: Harvel Quale, MD;  [facility];  Service: Gastroenterology;;   CARDIOVASCULAR STRESS TEST  08/2010   CHOLECYSTECTOMY     COLONOSCOPY WITH PROPOFOL N/A 08/28/2021   Procedure: COLONOSCOPY WITH PROPOFOL;  Surgeon: Harvel Quale, MD;  [facility];  Service: Gastroenterology;  Laterality: N/A;  1045  ASA 1   ENDOSCOPIC MUCOSAL RESECTION  09/18/2021   Procedure: ENDOSCOPIC MUCOSAL RESECTION;  Surgeon: Rush Landmark Telford Nab., MD;  [facility];  Service: Gastroenterology;;   EUS N/A 09/18/2021   Procedure: LOWER ENDOSCOPIC ULTRASOUND (EUS);  Surgeon: Irving Copas., MD;  [facility];  Service: Gastroenterology;  Laterality: N/A;   FLEXIBLE SIGMOIDOSCOPY N/A 09/18/2021   Procedure: FLEXIBLE SIGMOIDOSCOPY;  Surgeon: Rush Landmark Telford Nab., MD;  Location: [facility];  Service: Gastroenterology;  Laterality: N/A;   HEMOSTASIS CLIP PLACEMENT   09/18/2021   Procedure: HEMOSTASIS CLIP PLACEMENT;  Surgeon: Rush Landmark Telford Nab., MD;  Location: [facility];  Service: Gastroenterology;;   HERNIA REPAIR     R inguinal   JOINT REPLACEMENT Right    MASTOID DEBRIDEMENT     R ear   POLYPECTOMY  08/28/2021   Procedure: POLYPECTOMY;  Surgeon: Harvel Quale, MD;  [facility];  Service: Gastroenterology;;   POLYPECTOMY  09/18/2021   Procedure: POLYPECTOMY;  Surgeon: Irving Copas., MD;  Location: [facility];  Service: Gastroenterology;;   RESECTION DISTAL CLAVICAL Left 11/30/2014   Procedure: RESECTION DISTAL CLAVICAL;  Surgeon: Kathryne Hitch, MD;  Location: [place];  Service: Orthopedics;  Laterality: Left;   SHOULDER ARTHROSCOPY WITH BICEPSTENOTOMY Left 11/30/2014   Procedure: SHOULDER ARTHROSCOPY WITH BICEPSTENOTOMY;  Surgeon: Kathryne Hitch, MD;  Location: [place];  Service: Orthopedics;  Laterality: Left;   SHOULDER ARTHROSCOPY WITH ROTATOR CUFF REPAIR AND SUBACROMIAL DECOMPRESSION Left 11/30/2014   Procedure: LEFT SHOULDER ARTHROSCOPY WITH DISTAL CLAVICAL EXCISION, ACROMIOPLASTY, ROTATOR CUFF REPAIR , BICEPS TENOTOMY;  Surgeon: Kathryne Hitch, MD;  Location: [place];  Service: Orthopedics;  Laterality: Left;   SUBMUCOSAL LIFTING INJECTION  09/18/2021   Procedure: SUBMUCOSAL LIFTING INJECTION;  Surgeon: Irving Copas., MD;  Location[facility];  Service: Gastroenterology;;   TENDON REPAIR  2005   R elbow   TOTAL KNEE ARTHROPLASTY  05/28/2011   Procedure: TOTAL KNEE ARTHROPLASTY;  Surgeon: Ninetta Lights, MD;  Location: [place];  Service: Orthopedics;  Laterality: Right;  Right total knee arthroplasty    US ECHOCARDIOGRAPHY  08/2010   Current Outpatient Medications on File Prior to Visit  Medication Sig Dispense Refill   Magnesium Oxide 200 MG TABS Take 400 mg by mouth at bedtime.     silodosin (RAPAFLO) 8 MG CAPS capsule Take 1 capsule (8 mg total)  by mouth every other day 45 capsule 3   simvastatin (ZOCOR) 40 MG tablet TAKE 1 TAB BY MOUTH DAILY AT 6 PM. 90 tablet 3   [DISCONTINUED] promethazine (PHENERGAN) 25 MG tablet Take 1 tablet (25 mg total) by mouth every 8 (eight) hours as needed for nausea or vomiting. 20 tablet 0   No current facility-administered medications on file prior to visit.   Allergies  Allergen Reactions   Codeine Nausea And Vomiting   Lipitor [Atorvastatin Calcium] Other (See Comments)    myalgias        Objective:    BP 115/82   Pulse (!) 126   Temp (!) 97.5 F (36.4 C) (Oral)   Ht 5' 9"$  (1.753 m)   Wt 184 lb (83.5 kg)   SpO2 95%   BMI 27.17 kg/m    Physical Exam Vitals and nursing note reviewed.  Constitutional:      Appearance: Normal appearance. He is normal weight.  HENT:     Head: Normocephalic and atraumatic.     Right Ear: Ear canal and external ear normal. Tympanic membrane is perforated.     Left Ear: Tympanic membrane, ear canal and external ear normal.     Nose: Congestion present.     Right Sinus: Frontal sinus tenderness present.     Left Sinus: Frontal sinus tenderness present.     Mouth/Throat:     Mouth: Mucous membranes are moist.     Pharynx: Oropharynx is clear.  Eyes:     Conjunctiva/sclera: Conjunctivae normal.  Cardiovascular:     Rate and Rhythm: Normal rate and regular rhythm.     Pulses: Normal pulses.     Heart sounds: Normal heart sounds.  Pulmonary:     Effort: Pulmonary effort is normal.     Breath sounds: Examination of the right-upper field reveals rhonchi. Examination of the left-upper field reveals rhonchi. Examination of the right-lower field reveals rhonchi. Examination of the left-lower field reveals rhonchi. Rhonchi present.  Musculoskeletal:     Cervical back: Normal range of motion. No tenderness.  Lymphadenopathy:     Cervical: No cervical adenopathy.  Skin:    General: Skin is warm and dry.     Capillary Refill: Capillary refill takes less  than 2 seconds.  Neurological:     General: No focal deficit present.     Mental Status: He is alert and oriented to person, place, and time. Mental status is at baseline.  Psychiatric:        Mood and Affect: Mood normal.        Behavior: Behavior normal.        Thought Content: Thought content normal.        Judgment: Judgment normal.     No results found for any visits on 08/07/22.      Assessment & Plan:   Problem List Items Addressed This Visit       Respiratory   Acute bacterial sinusitis - Primary    Symptoms consistent with acute bacterial sinusitis. Will treat with Augmentin 500-176m TID for 10 days. Instructed to return to office if symptoms persist or worsen.  He is tachycardic at today's visit without chest pain, lightheadedness, palpitations, shortness of breath. EKG showed sinus arrhythmia, other findings consistent with prior EKGs. Patient has been taking Sudafed for his symptoms, instructed to discontinue this. Will monitor his HR at home as he does have a pulse ox and return to office in 1 week for recheck. Instructed to seek medical care for chest pain, palpitations, SOB, lightheadedness or if HR does not return to <100.      Relevant Medications   amoxicillin-clavulanate (AUGMENTIN) 500-125 MG tablet     Other   Tachycardia   Relevant Orders   EKG 12-Lead (Completed)    Meds ordered this encounter  Medications   amoxicillin-clavulanate (AUGMENTIN) 500-125 MG tablet    Sig: Take 1 tablet by mouth 2 (two) times daily for 5 days.    Dispense:  10 tablet    Refill:  0    Order Specific Question:   Supervising Provider    Answer:   Jenna Luo T [[id number]]    Return in about 1 week (around 08/14/2022) for HR check.  Rubie Maid, FNP

## 2022-08-14 ENCOUNTER — Ambulatory Visit (INDEPENDENT_AMBULATORY_CARE_PROVIDER_SITE_OTHER): Payer: Medicare Other | Admitting: Family Medicine

## 2022-08-14 ENCOUNTER — Encounter: Payer: Self-pay | Admitting: Family Medicine

## 2022-08-14 VITALS — BP 132/62 | HR 60 | Temp 97.6°F | Ht 69.0 in | Wt 202.0 lb

## 2022-08-14 DIAGNOSIS — J019 Acute sinusitis, unspecified: Secondary | ICD-10-CM

## 2022-08-14 DIAGNOSIS — B9689 Other specified bacterial agents as the cause of diseases classified elsewhere: Secondary | ICD-10-CM | POA: Diagnosis not present

## 2022-08-14 NOTE — Progress Notes (Signed)
   Acute Office Visit  Subjective:     Patient ID: Theodore Davis, male    DOB: 09-22-1945, 77 y.o.   MRN: [id number]  Chief Complaint  Patient presents with   Follow-up    1 week f/u meds/sinusitis    HPI Patient is in today for follow-up for elevated heart rate when he was seen last week for sinusitis. He has stopped using the Sudafed and completed a course of Augmentin, his HR has normalized since his visit and symptoms are overall improving. HR today is 60 in office. He does endorse ongoing clear rhinorrhea and mild pressure behind his eyes.  Review of Systems  Respiratory: Negative.    Cardiovascular: Negative.   All other systems reviewed and are negative.       Objective:    BP 132/62   Pulse 60   Temp 97.6 F (36.4 C) (Oral)   Ht 5' 9"$  (1.753 m)   Wt 202 lb (91.6 kg)   SpO2 94%   BMI 29.83 kg/m    Physical Exam Vitals and nursing note reviewed.  Constitutional:      Appearance: Normal appearance. He is normal weight.  HENT:     Head: Normocephalic and atraumatic.     Right Ear: Tympanic membrane, ear canal and external ear normal.     Left Ear: Tympanic membrane, ear canal and external ear normal.     Nose: Congestion present.     Mouth/Throat:     Mouth: Mucous membranes are moist.     Pharynx: Oropharynx is clear.  Eyes:     Conjunctiva/sclera: Conjunctivae normal.  Cardiovascular:     Rate and Rhythm: Normal rate and regular rhythm.     Pulses: Normal pulses.     Heart sounds: Normal heart sounds.  Pulmonary:     Effort: Pulmonary effort is normal.     Breath sounds: Normal breath sounds.  Skin:    General: Skin is warm and dry.     Capillary Refill: Capillary refill takes less than 2 seconds.  Neurological:     General: No focal deficit present.     Mental Status: He is alert and oriented to person, place, and time. Mental status is at baseline.  Psychiatric:        Mood and Affect: Mood normal.        Behavior: Behavior normal.         Thought Content: Thought content normal.        Judgment: Judgment normal.     No results found for any visits on 08/14/22.      Assessment & Plan:   Problem List Items Addressed This Visit       Respiratory   Acute bacterial sinusitis - Primary    Improving with antibiotic. I encouraged him to restart taking his daily allergy pill as he does work outside daily and use neti pot for mild ongoing symptoms. HR has returned to baseline. Instructed to return to office for fever or worsening symptoms, chest pain, palpitations, lightheadedness.       No orders of the defined types were placed in this encounter.   Return if symptoms worsen or fail to improve.  Rubie Maid, FNP

## 2022-08-14 NOTE — Assessment & Plan Note (Signed)
Improving with antibiotic. I encouraged him to restart taking his daily allergy pill as he does work outside daily and use neti pot for mild ongoing symptoms. HR has returned to baseline. Instructed to return to office for fever or worsening symptoms, chest pain, palpitations, lightheadedness.

## 2022-08-15 ENCOUNTER — Telehealth: Payer: Self-pay | Admitting: Cardiovascular Disease

## 2022-08-15 MED ORDER — SIMVASTATIN 40 MG PO TABS: ORAL_TABLET | ORAL | 3 refills | Status: DC

## 2022-08-15 NOTE — Telephone Encounter (Signed)
*  STAT* If patient is at the pharmacy, call can be transferred to refill team.   1. Which medications need to be refilled? (please list name of each medication and dose if known) simvastatin (ZOCOR) 40 MG tablet   2. Which pharmacy/location (including street and city if local pharmacy) is medication to be sent to?  [facility] - [place], [place] - [address]   3. Do they need a 30 day or 90 day supply? Theodore Davis

## 2022-08-15 NOTE — Telephone Encounter (Signed)
Pt medication was sent [facility] as requested.

## 2022-08-19 ENCOUNTER — Ambulatory Visit (INDEPENDENT_AMBULATORY_CARE_PROVIDER_SITE_OTHER): Payer: Medicare Other | Admitting: Family Medicine

## 2022-08-19 ENCOUNTER — Encounter: Payer: Self-pay | Admitting: Family Medicine

## 2022-08-19 VITALS — BP 118/64 | HR 59 | Temp 97.7°F | Ht 69.0 in | Wt 198.0 lb

## 2022-08-19 DIAGNOSIS — J019 Acute sinusitis, unspecified: Secondary | ICD-10-CM | POA: Diagnosis not present

## 2022-08-19 DIAGNOSIS — B9689 Other specified bacterial agents as the cause of diseases classified elsewhere: Secondary | ICD-10-CM | POA: Diagnosis not present

## 2022-08-19 MED ORDER — LEVOFLOXACIN 500 MG PO TABS: 500.0000 mg | ORAL_TABLET | Freq: Every day | ORAL | 0 refills | Status: AC

## 2022-08-19 MED ORDER — PREDNISONE 20 MG PO TABS: ORAL_TABLET | ORAL | 0 refills | Status: DC

## 2022-08-19 NOTE — Progress Notes (Signed)
Subjective:    Patient ID: Theodore Davis, male    DOB: [birth year], 77 y.o.   MRN: [id number]  Patient states that he has been battling a sinus infection since Christmas.  For the last 6 weeks, it has been constant.  He reports constant pain and pressure in both maxillary sinuses.  He reports postnasal drip and drainage going down his throat.  He reports daily headache.  He reports cough particularly bad at night when he is lying supine.  He reports wheezing.  Today on examination he has bilateral maxillary sinus tenderness and pressure in pain with percussion.  He also has expiratory rhonchi and wheezing.  He was given amoxicillin albeit 500 mg twice daily for 5 days and saw no improvement.  He has been trying Claritin, Mucinex DM, and cold medication without any relief Past Medical History:  Diagnosis Date   Arthritis 10/03/2010   right sternoclavicular joint   Heart murmur    Hemangioma of spine    lower lumbar   HOH (hard of hearing)    R ear   Hyperlipidemia    hypercholesterolemia   Lyme disease    Past Surgical History:  Procedure Laterality Date   BIOPSY  08/28/2021   Procedure: BIOPSY;  Surgeon: Harvel Quale, MD;  [facility];  Service: Gastroenterology;;   CARDIOVASCULAR STRESS TEST  08/2010   CHOLECYSTECTOMY     COLONOSCOPY WITH PROPOFOL N/A 08/28/2021   Procedure: COLONOSCOPY WITH PROPOFOL;  Surgeon: Harvel Quale, MD;  [facility];  Service: Gastroenterology;  Laterality: N/A;  1045  ASA 1   ENDOSCOPIC MUCOSAL RESECTION  09/18/2021   Procedure: ENDOSCOPIC MUCOSAL RESECTION;  Surgeon: Rush Landmark Telford Nab., MD;  [facility];  Service: Gastroenterology;;   EUS N/A 09/18/2021   Procedure: LOWER ENDOSCOPIC ULTRASOUND (EUS);  Surgeon: Irving Copas., MD;  [facility];  Service: Gastroenterology;  Laterality: N/A;   FLEXIBLE SIGMOIDOSCOPY N/A 09/18/2021   Procedure: FLEXIBLE SIGMOIDOSCOPY;  Surgeon:  Rush Landmark Telford Nab., MD;  Location[facility];  Service: Gastroenterology;  Laterality: N/A;   HEMOSTASIS CLIP PLACEMENT  09/18/2021   Procedure: HEMOSTASIS CLIP PLACEMENT;  Surgeon: Rush Landmark Telford Nab., MD;  Location: [facility];  Service: Gastroenterology;;   HERNIA REPAIR     R inguinal   JOINT REPLACEMENT Right    MASTOID DEBRIDEMENT     R ear   POLYPECTOMY  08/28/2021   Procedure: POLYPECTOMY;  Surgeon: Harvel Quale, MD;  [facility];  Service: Gastroenterology;;   POLYPECTOMY  09/18/2021   Procedure: POLYPECTOMY;  Surgeon: Irving Copas., MD;  Location: [facility];  Service: Gastroenterology;;   RESECTION DISTAL CLAVICAL Left 11/30/2014   Procedure: RESECTION DISTAL CLAVICAL;  Surgeon: Kathryne Hitch, MD;  Location: [place];  Service: Orthopedics;  Laterality: Left;   SHOULDER ARTHROSCOPY WITH BICEPSTENOTOMY Left 11/30/2014   Procedure: SHOULDER ARTHROSCOPY WITH BICEPSTENOTOMY;  Surgeon: Kathryne Hitch, MD;  Location: [place];  Service: Orthopedics;  Laterality: Left;   SHOULDER ARTHROSCOPY WITH ROTATOR CUFF REPAIR AND SUBACROMIAL DECOMPRESSION Left 11/30/2014   Procedure: LEFT SHOULDER ARTHROSCOPY WITH DISTAL CLAVICAL EXCISION, ACROMIOPLASTY, ROTATOR CUFF REPAIR , BICEPS TENOTOMY;  Surgeon: Kathryne Hitch, MD;  Location: [place];  Service: Orthopedics;  Laterality: Left;   SUBMUCOSAL LIFTING INJECTION  09/18/2021   Procedure: SUBMUCOSAL LIFTING INJECTION;  Surgeon: Rush Landmark Telford Nab., MD;  Location[facility];  Service: Gastroenterology;;   TENDON REPAIR  2005   R elbow  TOTAL KNEE ARTHROPLASTY  05/28/2011   Procedure: TOTAL KNEE ARTHROPLASTY;  Surgeon: Ninetta Lights, MD;  Location: [place];  Service: Orthopedics;  Laterality: Right;  Right total knee arthroplasty    US ECHOCARDIOGRAPHY  08/2010   Current Outpatient Medications on File Prior to Visit  Medication Sig Dispense Refill    Magnesium Oxide 200 MG TABS Take 400 mg by mouth at bedtime.     silodosin (RAPAFLO) 8 MG CAPS capsule Take 1 capsule (8 mg total) by mouth every other day 45 capsule 3   simvastatin (ZOCOR) 40 MG tablet TAKE 1 TAB BY MOUTH DAILY AT 6 PM. 90 tablet 3   [DISCONTINUED] promethazine (PHENERGAN) 25 MG tablet Take 1 tablet (25 mg total) by mouth every 8 (eight) hours as needed for nausea or vomiting. 20 tablet 0   No current facility-administered medications on file prior to visit.   Allergies  Allergen Reactions   Codeine Nausea And Vomiting   Lipitor [Atorvastatin Calcium] Other (See Comments)    myalgias   Social History   Socioeconomic History   Marital status: Married    Spouse name: Not on file   Number of children: 2   Years of education: Not on file   Highest education level: Not on file  Occupational History   Not on file  Tobacco Use   Smoking status: Former    Packs/day: 0.25    Years: 50.00    Total pack years: 12.50    Types: Cigarettes    Quit date: 06/23/1997    Years since quitting: 25.1   Smokeless tobacco: Former  Scientific laboratory technician Use: Never used  Substance and Sexual Activity   Alcohol use: Yes    Comment: occasional beer   Drug use: No   Sexual activity: Yes  Other Topics Concern   Not on file  Social History Narrative   [facility] (04/08/17):   Originally from [place] [facility]. Has lived in [place] as well. Previously worked for the Education officer, environmental as a Dealer. Has 2 dogs & a rabbit currently. No bird exposure. He reports black mold was found in his air conditioner at [facility] when it was changed out 2 years ago. No hot tub exposure. Enjoys fishing.    Married since 1974.    2 sons   3 grandchildren.   Social Determinants of Health   Financial Resource Strain: Low Risk  (06/24/2022)   Overall Financial Resource Strain (CARDIA)    Difficulty of Paying Living Expenses: Not hard at all  Food Insecurity: No Food Insecurity (06/24/2022)   Hunger Vital Sign     Worried About Running Out of Food in the Last Year: Never true    Ran Out of Food in the Last Year: Never true  Transportation Needs: No Transportation Needs (06/24/2022)   PRAPARE - Hydrologist (Medical): No    Lack of Transportation (Non-Medical): No  Physical Activity: Unknown (06/24/2022)   Exercise Vital Sign    Days of Exercise per Week: 5 days    Minutes of Exercise per Session: Not on file  Stress: No Stress Concern Present (06/24/2022)   [employer]    Feeling of Stress : Not at all  Social Connections: [place] (06/24/2022)   Social Connection and Isolation Panel [NHANES]    Frequency of Communication with Friends and Family: More than three times a week    Frequency of Social Gatherings with  Friends and Family: Three times a week    Attends Religious Services: More than 4 times per year    Active Member of Clubs or Organizations: Yes    Attends Archivist Meetings: More than 4 times per year    Marital Status: Married  Human resources officer Violence: Not At Risk (06/24/2022)   Humiliation, Afraid, Rape, and Kick questionnaire    Fear of Current or Ex-Partner: No    Emotionally Abused: No    Physically Abused: No    Sexually Abused: No   Family History  Problem Relation Age of Onset   Heart disease Mother    Atrial fibrillation Mother    Heart disease Father    Rheum arthritis Father    Bladder Cancer Father    Atrial fibrillation Father    Brain cancer Maternal Uncle    Lung disease Neg Hx      Review of Systems  All other systems reviewed and are negative.      Objective:   Physical Exam Vitals and nursing note reviewed.  Constitutional:      General: He is not in acute distress.    Appearance: Normal appearance. He is well-developed. He is not ill-appearing, toxic-appearing or diaphoretic.  HENT:     Head: Normocephalic and atraumatic.     Right Ear:  Tympanic membrane and ear canal normal.     Left Ear: Tympanic membrane and ear canal normal.     Nose: Congestion and rhinorrhea present.     Right Sinus: Maxillary sinus tenderness present.     Left Sinus: Maxillary sinus tenderness present.     Mouth/Throat:     Mouth: Mucous membranes are moist.     Pharynx: Oropharynx is clear. No oropharyngeal exudate or posterior oropharyngeal erythema.  Eyes:     Conjunctiva/sclera: Conjunctivae normal.  Cardiovascular:     Rate and Rhythm: Normal rate and regular rhythm.     Heart sounds: Normal heart sounds.  Pulmonary:     Effort: Pulmonary effort is normal. No respiratory distress.     Breath sounds: Examination of the right-middle field reveals wheezing and rhonchi. Examination of the left-middle field reveals wheezing and rhonchi. Wheezing and rhonchi present.  Neurological:     Mental Status: He is alert.         Assessment & Plan:  Acute bacterial sinusitis I believe the patient has a sinus infection.  I do not believe that the previous dose of amoxicillin was of a sufficient strength or duration to treat.  Therefore I will give the patient Levaquin 500 mg daily for 7 days along with prednisone taper pack.  Recheck in 1 week if no better or worsening.  Patient is taking COVID test at home and they were all negative

## 2022-09-12 ENCOUNTER — Ambulatory Visit: Payer: Medicare Other | Attending: Cardiovascular Disease | Admitting: Cardiovascular Disease

## 2022-09-12 ENCOUNTER — Encounter: Payer: Self-pay | Admitting: Cardiovascular Disease

## 2022-09-12 DIAGNOSIS — R002 Palpitations: Secondary | ICD-10-CM | POA: Diagnosis not present

## 2022-09-12 DIAGNOSIS — I358 Other nonrheumatic aortic valve disorders: Secondary | ICD-10-CM

## 2022-09-12 DIAGNOSIS — E785 Hyperlipidemia, unspecified: Secondary | ICD-10-CM

## 2022-09-12 DIAGNOSIS — U071 COVID-19: Secondary | ICD-10-CM

## 2022-09-12 DIAGNOSIS — I5189 Other ill-defined heart diseases: Secondary | ICD-10-CM | POA: Diagnosis not present

## 2022-09-12 MED ORDER — METOPROLOL SUCCINATE ER 25 MG PO TB24: 12.5000 mg | ORAL_TABLET | Freq: Every day | ORAL | 3 refills | Status: DC

## 2022-09-12 NOTE — Patient Instructions (Signed)
Medication Instructions:  START metoprolol succinate (Toprol XL) 12.5 mg (1/2 tablet) daily  *If you need a refill on your cardiac medications before your next appointment, please call your pharmacy*   Lab Work: Please return for FASTING labs  (CMET, CBC, Lipid, TSH, LPa)  Our in office lab hours are Monday-Friday 8:00-4:00, closed for lunch 12:45-1:45 pm.  No appointment needed.  [facility] locations:   [place] ([place]) - [id number] [place] [address] [place] [address] - [address] [place] - [address] Oncologist)  Testing/Procedures: Your physician has requested that you have an echocardiogram in 3 MONTHS. Echocardiography is a painless test that uses sound waves to create images of your heart. It provides your doctor with information about the size and shape of your heart and how well your heart's chambers and valves are working. This procedure takes approximately one hour. There are no restrictions for this procedure. Please do NOT wear cologne, perfume, aftershave, or lotions (deodorant is allowed). Please arrive 15 minutes prior to your appointment time.  Follow-Up: At [facility], you and your health needs are our priority.  As part of our continuing mission to provide you with exceptional heart care, we have created designated Provider Care Teams.  These Care Teams include your primary Cardiologist (physician) and Advanced Practice Providers (APPs -  Physician Assistants and Nurse Practitioners) who all work together to provide you with the care you need, when you need it.  We recommend signing up for the patient portal called "MyChart".  Sign up information is provided on this After Visit  Summary.  MyChart is used to connect with patients for Virtual Visits (Telemedicine).  Patients are able to view lab/test results, encounter notes, upcoming appointments, etc.  Non-urgent messages can be sent to your provider as well.   To learn more about what you can do with MyChart, go to [email or web address].    Your next appointment:   3-4 month(s)  Provider:   Shelva Majestic, MD

## 2022-09-12 NOTE — Progress Notes (Signed)
Patient ID: Theodore Davis, male   DOB: 22-Jul-1945, 77 y.o.   MRN: [id number]        Primary M.D.: Dr. Margaretmary Eddy  HPI: Theodore Davis is a 77 y.o. male who presents to the office today for a 20 month follow-up cardiology evaluation.  I last saw him in July 2022.  Theodore Davis has a strong family history for CAD in both his mother and father.  He also has a history of hyperlipidemia.  I had seen him in 2012 and at that time an echo Doppler study was essentially normal.  A nuclear perfusion study revealed normal perfusion and function.  Post-rest ejection fraction was 68%.   I had not seen him in over 5-1/2 years but saw him when he was referred by Dr. Dennard Schaumann to evaluate episodes of shortness of breath with activity. Oftentimes his shortness of breath is worse when he wakes up.  He denied any definitive chest pain.  He does admit to some increased anxiety.  He also has had issues with leg cramps but has been taking supplemental magnesium with benefit.  He was put on Rapaflo by his urologist to improve urine flow.  This has resulted in increased episodes of shortness of breath.    In 2016 laboratory revealed a normal BNP at 21.7.  He was not anemic with a hemoglobin of 15 and hematocrit of 43.8. Chemistry panel was normal, although glucose was minimally increased at 106, and bilirubin at 1.4 but other LFTs were normal. PSA was 0.45.  Lipid studies revealed a total cholesterol 164, triglycerides 80, HDL 56, and LDL 92.  TSH was 0.349. A chest x-ray in November showed stable right infrahilar prominence, which was present on a prior chest x-ray of November 2012.  There was no acute cardiopulmonary abnormality.  An echo Doppler study on 05/15/2015 showed an ejection fraction at 55-60% with normal wall motion.  There was a suggestion of mild grade 1 diastolic dysfunction.  Tissue Doppler was indeterminant.  There was mild thickening of the mitral valve with trivial mitral regurgitation.     He has  been retired for many years.  I saw him in January 2018 that time he denied any chest pain.  He had joined a gym and was exercising.  He had lost 10 pounds.   He underwent an echo Doppler study in October 2018 ordered by Dr. Ashok Cordia which showed normal systolic function.  There was mild focal basal hypertrophy of the septum.  EF is 55 to 60% and there was grade 1 diastolic dysfunction.  There was mild aortic sclerosis without stenosis.  In January 2018 he underwent a chest CT ordered by Dr. Lake Bells.  He had mildly enlarged mediastinal lymph nodes, potentially reactive in etiology which are grossly unchanged from previous assessment.  He also was noted to have aortic atherosclerosis.  When I last saw him in November 2019 he was feeling well and denied chest pain PND orthopnea.  Laboratory in July 2019 showed an LDL at 48 and he has been on simvastatin 20 mg.    Since I last saw him, he underwent an echo Doppler study in August 2020 which revealed normal LV systolic function with EF of 55 to 60% with grade 1 diastolic dysfunction.  There was mild mitral annular calcification and mild aortic sclerosis without stenosis.  He developed a COVID infection in November/December 2021 and lost his sense of taste and smell.  He has received the [employer] vaccination.  He  has been caring for his father-in-law who had had a stroke.  He denies any recent chest pain.  He states his blood pressure at home typically runs between [id number] systolically with diastolics around 123456.  He denies any chest pain or significant shortness of breath.  At times he notes very rare dizzy spells.  He continues to be followed by Dr. Dennard Schaumann.  He is on simvastatin 40 mg for hyperlipidemia.  He is on finasteride 5 mg and silodosin 8 mg at bedtime.  Past Medical History:  Diagnosis Date   Arthritis 10/03/2010   right sternoclavicular joint   Heart murmur    Hemangioma of spine    lower lumbar   HOH (hard of hearing)    R ear   Hyperlipidemia     hypercholesterolemia   Lyme disease     Past Surgical History:  Procedure Laterality Date   BIOPSY  08/28/2021   Procedure: BIOPSY;  Surgeon: Harvel Quale, MD;  [facility];  Service: Gastroenterology;;   CARDIOVASCULAR STRESS TEST  08/2010   CHOLECYSTECTOMY     COLONOSCOPY WITH PROPOFOL N/A 08/28/2021   Procedure: COLONOSCOPY WITH PROPOFOL;  Surgeon: Harvel Quale, MD;  [facility];  Service: Gastroenterology;  Laterality: N/A;  1045  ASA 1   ENDOSCOPIC MUCOSAL RESECTION  09/18/2021   Procedure: ENDOSCOPIC MUCOSAL RESECTION;  Surgeon: Rush Landmark Telford Nab., MD;  Location: [facility];  Service: Gastroenterology;;   EUS N/A 09/18/2021   Procedure: LOWER ENDOSCOPIC ULTRASOUND (EUS);  Surgeon: Irving Copas., MD;  [facility];  Service: Gastroenterology;  Laterality: N/A;   FLEXIBLE SIGMOIDOSCOPY N/A 09/18/2021   Procedure: FLEXIBLE SIGMOIDOSCOPY;  Surgeon: Rush Landmark Telford Nab., MD;  Location: [facility];  Service: Gastroenterology;  Laterality: N/A;   HEMOSTASIS CLIP PLACEMENT  09/18/2021   Procedure: HEMOSTASIS CLIP PLACEMENT;  Surgeon: Rush Landmark Telford Nab., MD;  Location: [facility];  Service: Gastroenterology;;   HERNIA REPAIR     R inguinal   JOINT REPLACEMENT Right    MASTOID DEBRIDEMENT     R ear   POLYPECTOMY  08/28/2021   Procedure: POLYPECTOMY;  Surgeon: Harvel Quale, MD;  [facility];  Service: Gastroenterology;;   POLYPECTOMY  09/18/2021   Procedure: POLYPECTOMY;  Surgeon: Irving Copas., MD;  Location: [facility];  Service: Gastroenterology;;   RESECTION DISTAL CLAVICAL Left 11/30/2014   Procedure: RESECTION DISTAL CLAVICAL;  Surgeon: Kathryne Hitch, MD;  Location: [place];  Service: Orthopedics;  Laterality: Left;   SHOULDER ARTHROSCOPY WITH BICEPSTENOTOMY Left 11/30/2014   Procedure: SHOULDER ARTHROSCOPY WITH BICEPSTENOTOMY;  Surgeon: Kathryne Hitch,  MD;  Location: [place];  Service: Orthopedics;  Laterality: Left;   SHOULDER ARTHROSCOPY WITH ROTATOR CUFF REPAIR AND SUBACROMIAL DECOMPRESSION Left 11/30/2014   Procedure: LEFT SHOULDER ARTHROSCOPY WITH DISTAL CLAVICAL EXCISION, ACROMIOPLASTY, ROTATOR CUFF REPAIR , BICEPS TENOTOMY;  Surgeon: Kathryne Hitch, MD;  Location: [place];  Service: Orthopedics;  Laterality: Left;   SUBMUCOSAL LIFTING INJECTION  09/18/2021   Procedure: SUBMUCOSAL LIFTING INJECTION;  Surgeon: Irving Copas., MD;  Location: [facility];  Service: Gastroenterology;;   TENDON REPAIR  2005   R elbow   TOTAL KNEE ARTHROPLASTY  05/28/2011   Procedure: TOTAL KNEE ARTHROPLASTY;  Surgeon: Ninetta Lights, MD;  Location: [place];  Service: Orthopedics;  Laterality: Right;  Right total knee arthroplasty    US ECHOCARDIOGRAPHY  08/2010    Allergies  Allergen Reactions   Codeine Nausea And Vomiting  Lipitor [Atorvastatin Calcium] Other (See Comments)    myalgias    Current Outpatient Medications  Medication Sig Dispense Refill   Magnesium Gluconate 550 MG TABS Take 225 mg by mouth daily.     Magnesium Oxide 200 MG TABS Take 400 mg by mouth at bedtime.     predniSONE (DELTASONE) 20 MG tablet 3 tabs poqday 1-2, 2 tabs poqday 3-4, 1 tab poqday 5-6 12 tablet 0   silodosin (RAPAFLO) 8 MG CAPS capsule Take 1 capsule (8 mg total) by mouth every other day 45 capsule 3   simvastatin (ZOCOR) 40 MG tablet TAKE 1 TAB BY MOUTH DAILY AT 6 PM. 90 tablet 3   No current facility-administered medications for this visit.    Social History   Socioeconomic History   Marital status: Married    Spouse name: Not on file   Number of children: 2   Years of education: Not on file   Highest education level: Not on file  Occupational History   Not on file  Tobacco Use   Smoking status: Former    Packs/day: 0.25    Years: 50.00    Additional pack years: 0.00    Total pack years: 12.50    Types:  Cigarettes    Quit date: 06/23/1997    Years since quitting: 25.2   Smokeless tobacco: Former  Scientific laboratory technician Use: Never used  Substance and Sexual Activity   Alcohol use: Yes    Comment: occasional beer   Drug use: No   Sexual activity: Yes  Other Topics Concern   Not on file  Social History Narrative   [facility] (04/08/17):   Originally from [facility]. Has lived in [place] as well. Previously worked for the Education officer, environmental as a Dealer. Has 2 dogs & a rabbit currently. No bird exposure. He reports black mold was found in his air conditioner at [facility] when it was changed out 2 years ago. No hot tub exposure. Enjoys fishing.    Married since 1974.    2 sons   3 grandchildren.   Social Determinants of Health   Financial Resource Strain: Low Risk  (06/24/2022)   Overall Financial Resource Strain (CARDIA)    Difficulty of Paying Living Expenses: Not hard at all  Food Insecurity: No Food Insecurity (06/24/2022)   Hunger Vital Sign    Worried About Running Out of Food in the Last Year: Never true    Ran Out of Food in the Last Year: Never true  Transportation Needs: No Transportation Needs (06/24/2022)   PRAPARE - Hydrologist (Medical): No    Lack of Transportation (Non-Medical): No  Physical Activity: Unknown (06/24/2022)   Exercise Vital Sign    Days of Exercise per Week: 5 days    Minutes of Exercise per Session: Not on file  Stress: No Stress Concern Present (06/24/2022)   [place]    Feeling of Stress : Not at all  Social Connections: [place] (06/24/2022)   Social Connection and Isolation Panel [NHANES]    Frequency of Communication with Friends and Family: More than three times a week    Frequency of Social Gatherings with Friends and Family: Three times a week    Attends Religious Services: More than 4 times per year    Active Member of Clubs or Organizations: Yes     Attends Archivist Meetings: More than 4 times per year  Marital Status: Married  Human resources officer Violence: Not At Risk (06/24/2022)   Humiliation, Afraid, Rape, and Kick questionnaire    Fear of Current or Ex-Partner: No    Emotionally Abused: No    Physically Abused: No    Sexually Abused: No   Additional social history is notable that he is retired from  [employer].  He is married for 37 years and has 2 children, ages 25 and 1.  He has smoked for 25-30 years but quit 17 years ago.  He exercises occasionally only 4-5 times per month.  Family History  Problem Relation Age of Onset   Heart disease Mother    Atrial fibrillation Mother    Heart disease Father    Rheum arthritis Father    Bladder Cancer Father    Atrial fibrillation Father    Brain cancer Maternal Uncle    Lung disease Neg Hx    Additional family history is notable in that his mother died with a myocardial infarction but had a history of atrial fibrillation.  Father also had valve disease and died in 10-19-2012.  ROS General: Negative; No fevers, chills, or night sweats HEENT: Negative; No changes in vision or hearing, sinus congestion, difficulty swallowing Pulmonary: Positive for seasonal allergies; Negative; No cough, wheezing, shortness of breath, hemoptysis Cardiovascular: See HPI:  GI: Negative; No nausea, vomiting, diarrhea, or abdominal pain GU: Negative; No dysuria, hematuria, or difficulty voiding Musculoskeletal: Negative; no myalgias, joint pain, or weakness Hematologic: Negative; no easy bruising, bleeding Endocrine: Negative; no heat/cold intolerance; no diabetes, Neuro: Negative; no changes in balance, headaches Skin: Negative; No rashes or skin lesions Psychiatric: Positive for anxiety Sleep: Negative sleep study in 10-19-16 interpreted by Dr. Halford Chessman; No snoring,  daytime sleepiness, hypersomnolence, bruxism, restless legs, hypnogognic hallucinations. Other comprehensive 14 point system review is  negative   Physical Exam BP 136/72 (BP Location: Left Arm, Patient Position: Sitting, Cuff Size: Normal)   Pulse 65   Ht 5\' 9"  (1.753 m)   Wt 197 lb 9.6 oz (89.6 kg)   SpO2 94%   BMI 29.18 kg/m    Repeat blood pressure by me 128/70  Wt Readings from Last 3 Encounters:  09/12/22 197 lb 9.6 oz (89.6 kg)  08/19/22 198 lb (89.8 kg)  08/14/22 202 lb (91.6 kg)   General: Alert, oriented, no distress.  Skin: normal turgor, no rashes, warm and dry HEENT: Normocephalic, atraumatic. Pupils equal round and reactive to light; sclera anicteric; extraocular muscles intact;  Nose without nasal septal hypertrophy Mouth/Parynx benign; Mallinpatti scale 2 Neck: No JVD, no carotid bruits; normal carotid upstroke Lungs: clear to ausculatation and percussion; no wheezing or rales Chest wall: without tenderness to palpitation Heart: PMI not displaced, RRR, s1 s2 normal, 2/6 systolic murmur along the right and left sternal border, no diastolic murmur, no rubs, gallops, thrills, or heaves Abdomen: soft, nontender; no hepatosplenomehaly, BS+; abdominal aorta nontender and not dilated by palpation. Back: no CVA tenderness Pulses 2+ Musculoskeletal: full range of motion, normal strength, no joint deformities Extremities: no clubbing cyanosis or edema, Homan's sign negative  Neurologic: grossly nonfocal; Cranial nerves grossly wnl Psychologic: Normal mood and affect  September 12, 2022 ECG (independently read by me): SInus rhythm at 65, 1st degree AV block, PVC    December 27, 2020 ECG (independently read by me):  Sinus bradycardia at 53; no ectopy, normal intervals  November 2019 ECG (independently read by me): NSR at 60; Q wave in 3, normal intervals  January 2018  ECG (independently read by me): Normal sinus rhythm at 68 bpm.  Q wave in lead 3 and aVF.  January 2017 ECG (independently read by me):  Normal sinus rhythm at 61 bpm.  No ectopy.  Normal intervals.  November 2016 ECG (independently read by me):  Sinus bradycardia 55 bpm.  Inferior infarct by ECG with Q waves in 3 and aVF.  LABS:     Latest Ref Rng & Units 12/16/2021    4:17 PM 05/30/2020   12:53 PM 03/21/2020   12:10 PM  BMP  Glucose 65 - 99 mg/dL 96  116  115   BUN 7 - 25 mg/dL 20  13  11    Creatinine 0.70 - 1.28 mg/dL 1.21  0.91  0.9   BUN/Creat Ratio 6 - 22 (calc) NOT APPLICABLE  NOT APPLICABLE  NOT APPLICABLE   Sodium A999333 - 146 mmol/L 142  139  136   Potassium 3.5 - 5.3 mmol/L 4.7  4.1  4.3   Chloride 98 - 110 mmol/L 107  103  101   CO2 20 - 32 mmol/L 29  30  25    Calcium 8.6 - 10.3 mg/dL 9.2  8.6  8.9        Latest Ref Rng & Units 05/30/2020   12:53 PM 03/21/2020   12:10 PM 09/21/2019   10:16 AM  Hepatic Function  Total Protein 6.1 - 8.1 g/dL 6.6  6.7  6.1   AST 10 - 35 U/L 18  18  17    ALT 9 - 46 U/L 20  19  16    Total Bilirubin 0.2 - 1.2 mg/dL 0.6  4.0  1.0        Latest Ref Rng & Units 12/16/2021    4:17 PM 05/30/2020   12:53 PM 03/21/2020   12:10 PM  CBC  WBC 3.8 - 10.8 Thousand/uL 5.7  7.9  16.4   Hemoglobin 13.2 - 17.1 g/dL 15.1  12.7  15.8   Hematocrit 38.5 - 50.0 % 45.2  37.0  45.4   Platelets 140 - 400 Thousand/uL 217  405  198    Lab Results  Component Value Date   MCV 91.5 12/16/2021   MCV 88.7 05/30/2020   MCV 90.6 03/21/2020    Lab Results  Component Value Date   TSH 0.30 (L) 11/10/2016    BNP    Component Value Date/Time   BNP 21.7 05/07/2015 1131    ProBNP No results found for: "PROBNP"   Lipid Panel     Component Value Date/Time   CHOL 150 09/21/2019 1016   CHOL 156 08/09/2018 0923   TRIG 72 09/21/2019 1016   HDL 58 09/21/2019 1016   HDL 57 08/09/2018 0923   CHOLHDL 2.6 09/21/2019 1016   VLDL 16 12/29/2016 0951   LDLCALC 77 09/21/2019 1016     RADIOLOGY: No results found.  IMPRESSION:  No diagnosis found.   ASSESSMENT AND PLAN: Mr. Benett Eustace is a 77 year-old Caucasian male who has a significant family history of  CAD.  He has a history of hyperlipidemia  and has been on simvastatin 40 mg daily since his dose was titrated by me in 2019.  LDL cholesterol in March 2021 was 77.  In 2012, he had normal echo Doppler study and normal myocardial perfusion.  An echo Doppler study in November 2016 revealed normal systolic function and suggested mild grade 1 diastolic dysfunction.  He did not have any wall motion abnormality.  His ECG have demonstrated  inferior Q waves.  Since I last saw him in 2019 a follow-up echo Doppler study was done in August 2020 which showed an EF of 55 to 123456, grade 1 diastolic dysfunction, mild mitral annular calcification and mild aortic sclerosis without stenosis.  On examination today he has a 2/6 systolic murmur most likely due to his aortic valve sclerosis.  He is euvolemic on exam.  Blood pressure is stable.  He denies any chest pain or awareness of palpitations.  He has had issues with bladder infections and is on finasteride and silodosin for urinary retention.  He had a COVID infection in November/December 2021 and lost his taste and smell.  This subsequently triggered an exacerbation of reactive airway disease for which he was evaluated by Dr. Dennard Schaumann.  Presently, from a cardiac standpoint he remains stable.  He continues to care for his father-in-law who had a stroke.  I will see him in 1 year for follow-up evaluation or sooner as needed.   Troy Sine, MD, [facility]  09/12/2022 8:36 AM

## 2022-09-14 ENCOUNTER — Encounter: Payer: Self-pay | Admitting: Cardiovascular Disease

## 2022-09-15 LAB — LIPID PANEL

## 2022-09-17 LAB — CBC
Hematocrit: 46.7 % (ref 37.5–51.0)
Hemoglobin: 15.4 g/dL (ref 13.0–17.7)
MCH: 30.1 pg (ref 26.6–33.0)
MCHC: 33 g/dL (ref 31.5–35.7)
MCV: 91 fL (ref 79–97)
Platelets: 233 10*3/uL (ref 150–450)
RBC: 5.12 x10E6/uL (ref 4.14–5.80)
RDW: 12.7 % (ref 11.6–15.4)
WBC: 6.3 10*3/uL (ref 3.4–10.8)

## 2022-09-17 LAB — LIPID PANEL
Chol/HDL Ratio: 2.4 ratio (ref 0.0–5.0)
Cholesterol, Total: 171 mg/dL (ref 100–199)
HDL: 70 mg/dL (ref >39)
LDL Chol Calc (NIH): 84 mg/dL (ref 0–99)
Triglycerides: 96 mg/dL (ref 0–149)
VLDL Cholesterol Cal: 17 mg/dL (ref 5–40)

## 2022-09-17 LAB — LIPOPROTEIN A (LPA): Lipoprotein (a): 40.5 nmol/L (ref <75.0)

## 2022-09-17 LAB — COMPREHENSIVE METABOLIC PANEL
ALT: 17 IU/L (ref 0–44)
AST: 18 IU/L (ref 0–40)
Albumin/Globulin Ratio: 1.8 (ref 1.2–2.2)
Albumin: 4.1 g/dL (ref 3.8–4.8)
Alkaline Phosphatase: 78 IU/L (ref 44–121)
BUN/Creatinine Ratio: 12 (ref 10–24)
BUN: 13 mg/dL (ref 8–27)
Bilirubin Total: 1.3 mg/dL — ABNORMAL HIGH (ref 0.0–1.2)
CO2: 24 mmol/L (ref 20–29)
Calcium: 9 mg/dL (ref 8.6–10.2)
Chloride: 105 mmol/L (ref 96–106)
Creatinine, Ser: 1.07 mg/dL (ref 0.76–1.27)
Globulin, Total: 2.3 g/dL (ref 1.5–4.5)
Glucose: 113 mg/dL — ABNORMAL HIGH (ref 70–99)
Potassium: 4.7 mmol/L (ref 3.5–5.2)
Sodium: 144 mmol/L (ref 134–144)
Total Protein: 6.4 g/dL (ref 6.0–8.5)
eGFR: 73 mL/min/{1.73_m2} (ref >59)

## 2022-09-17 LAB — TSH: TSH: 0.673 u[IU]/mL (ref 0.450–4.500)

## 2022-11-08 ENCOUNTER — Inpatient Hospital Stay: Admit: 2022-11-08 | Discharge: 2022-11-08 | Disposition: A | Discharge status: Home / Self Care | Payer: MEDICARE

## 2022-11-08 DIAGNOSIS — S29012A Strain of muscle and tendon of back wall of thorax, initial encounter: Secondary | ICD-10-CM

## 2022-11-08 DIAGNOSIS — S29019A Strain of muscle and tendon of unspecified wall of thorax, initial encounter: Secondary | ICD-10-CM

## 2022-11-08 MED ORDER — ORPHENADRINE CITRATE ER 100 MG PO TB12
100 | ORAL_TABLET | Freq: Two times a day (BID) | ORAL | 0 refills | Status: AC
Start: 2022-11-08 — End: 2022-11-18

## 2022-11-08 MED ORDER — METHYLPREDNISOLONE 4 MG PO TBPK
4 | ORAL_TABLET | ORAL | 0 refills | Status: AC
Start: 2022-11-08 — End: 2022-11-14

## 2022-11-08 MED ORDER — ORPHENADRINE CITRATE 30 MG/ML IJ SOLN
30 | Freq: Once | INTRAMUSCULAR | Status: AC
Start: 2022-11-08 — End: 2022-11-08
  Administered 2022-11-08: 23:00:00 60 mg via INTRAMUSCULAR

## 2022-11-08 MED ORDER — DEXAMETHASONE SOD PHOSPHATE PF 10 MG/ML IJ SOLN
10 | Freq: Once | INTRAMUSCULAR | Status: AC
Start: 2022-11-08 — End: 2022-11-08
  Administered 2022-11-08: 23:00:00 10 mg via INTRAMUSCULAR

## 2022-11-08 MED ORDER — LIDOCAINE 5 % EX PTCH
5 | MEDICATED_PATCH | Freq: Every day | CUTANEOUS | 0 refills | Status: AC
Start: 2022-11-08 — End: 2022-11-18

## 2022-11-08 MED FILL — ORPHENADRINE CITRATE 30 MG/ML IJ SOLN: 30 MG/ML | INTRAMUSCULAR | Qty: 2

## 2022-11-08 MED FILL — DEXAMETHASONE SOD PHOSPHATE PF 10 MG/ML IJ SOLN: 10 MG/ML | INTRAMUSCULAR | Qty: 1

## 2022-11-08 NOTE — ED Provider Notes (Signed)
[facility]  Department of Emergency Medicine   UC  Encounter Note  Admit Date/RoomTime: 11/08/2022  6:05 PM  UC Room: 02/02    NAME: Troy Mcdaniel  DOB: Jan 23, 1946  MRN: [id number]     Chief Complaint:  Back Pain (Back mid spasms  started 3 days ago)    History of Present Illness       Troy Mcdaniel is a 77 y.o. old male presents with spasms to his mid thoracic back that started yesterday.  Patient states that he was using a pole washer to clean out his gutters and was doing a lot of up-and-down motions with his arms above his head did not fall or strike his back.  Pain is in the right thoracic region.  States that the spasms will come and go.  No fevers, no saddle anesthesia, no bowel or bladder incontinence, no bladder retention, no history of IV drug use.  Tried some ibuprofen, did not help.  Rates his pain currently as a 3 out of 10.  ROS   Pertinent positives and negatives are stated within HPI, all other systems reviewed and are negative.    Past Medical History:  has a past medical history of Asthma, Cancer (HCC), Hyperlipidemia, and Hypertension.    Surgical History:  has a past surgical history that includes Prostatectomy; Colonoscopy; Cataract extraction (Right); Pectoralis major repair; and Eye surgery (Left, 11/12/2021).    Social History:  reports that he has quit smoking. He has never used smokeless tobacco. He reports current alcohol use of about 1.0 standard drink of alcohol per week. He reports that he does not use drugs.    Family History: family history includes Cancer in his father; Dementia in his mother.     Allergies: Patient has no known allergies.    Physical Exam   Oxygen Saturation Interpretation: Normal.        ED Triage Vitals   BP Temp Temp Source Pulse Respirations SpO2 Height Weight - Scale   11/08/22 1810 11/08/22 1810 11/08/22 1805 11/08/22 1810 11/08/22 1810 11/08/22 1810 11/08/22 1805 11/08/22 1805   116/70 98.1 F (36.7 C) Infrared 73 18 94 % 1.88 m (6\' 2" ) 97.5 kg (215  lb)         Constitutional:  Alert, development consistent with age.  HEENT:  NC/NT.  Airway patent.  Neck:  Normal ROM.  Supple.  Respiratory:  Clear to auscultation and breath sounds equal.  CV:  Regular rate and rhythm, normal heart sounds, without pathological murmurs, ectopy, gallops, or rubs.  GI:  Abdomen Soft, nontender, good bowel sounds.  No firm or pulsatile mass.  Back: middle thoracic spine right sided.             Tenderness: None.             Swelling: no.              Range of Motion: full range of motion.              CVA Tenderness: No CVA tenderness.            Straight leg raising:  Bilateral negative.            Skin:  no wounds, erythema, or swelling.  Distal Function:              Motor deficit: none.              Sensory deficit: none.  Pulse deficit: none.            Calf Tenderness:  No Bilateral.               Edema:  none Both lower extremity(s).             Deep Tendon Reflexes (DTR's) to bilateral knee's and ankle's are normo-reflexive   Gait:  normal.  Integument:  Normal turgor.  Warm, dry, without visible rash.  Lymphatics: No lymphangitis or adenopathy noted.  Neurological:  Oriented.  Motor functions intact.    Lab / Imaging Results   (All laboratory and radiology results have been personally reviewed by myself)  Labs:  No results found for this visit on 11/08/22.    Imaging:  All Radiology results interpreted by Radiologist unless otherwise noted.  No orders to display       ED Course / Medical Decision Making     Medications   dexAMETHasone (PF) (DECADRON) injection 10 mg (10 mg IntraMUSCular Given 11/08/22 1844)   orphenadrine (NORFLEX) injection 60 mg (60 mg IntraMUSCular Given 11/08/22 1843)            Consult(s):   None    Procedure(s):   none    Medical Decision Making: Briefly is a 77 year old male patient who is presenting with muscle spasms to the right side thoracic spine after cleaning his gutters yesterday with a long pole, was making up-and-down motions  with his arms and reaching his arms above his head causing the pain.  On exam he has no tenderness of the thoracic spine however his pain returns whenever he moves his arms up and down.  No midline thoracic spinal tenderness, no step-offs, open areas, wounds, rashes.  No red flag symptoms.  Patient has no low back pain.  Advised patient given his age most relaxers would not likely be effective however he would like to try these in any rate.  Patient was given a dose of IM Decadron and Norflex.  Advised use of Lidoderm patches, Medrol Dosepak and Norflex at home.  Advised stretching exercises.  Advise follow-up with physical medicine rehabilitation if symptoms do not improve.  Discussed conditions requiring emergent reevaluation in the ER.     Plan of Care/Counseling:  Albesa Seen, APRN - CNP reviewed today's visit with the patient in addition to providing specific details for the plan of care and counseling regarding the diagnosis and prognosis.  Questions are answered at this time and are agreeable with the plan.    Assessment      1. Thoracic myofascial strain, initial encounter      Plan   Discharged home.  Patient condition is stable  Ethelda Chick I, MD  [address]  [place] [address]  [phone]    Schedule an appointment as soon as possible for a visit in 1 week      York Grice, DO  [address], [place]  [place] [address]  [phone]    Call   If symptoms worsen       New Medications     Discharge Medication List as of 11/08/2022  6:46 PM        START taking these medications    Details   methylPREDNISolone (MEDROL, PAK,) 4 MG tablet Take by mouth., Disp-21 tablet, R-0Normal      orphenadrine (NORFLEX) 100 MG extended release tablet Take 1 tablet by mouth 2 times daily for 10 days, Disp-20 tablet, R-0Normal  lidocaine (LIDODERM) 5 % Place 1 patch onto the skin daily for 10 days 12 hours on, 12 hours off., Disp-10 patch, R-0Normal           Electronically signed by Albesa Seen, APRN - CNP   DD: 11/08/22  **This report was transcribed using voice recognition software. Every effort was made to ensure accuracy; however, inadvertent computerized transcription errors may be present.  END OF ED PROVIDER NOTE      Albesa Seen, APRN - CNP  11/09/22 1418

## 2022-11-08 NOTE — Discharge Instructions (Addendum)
Lidoderm patch directly to the painful area.  Medrol Dosepak as directed.  Norflex twice daily for 10 days.  Keep in mind that muscle relaxers do not typically work in people over 65.  Please follow-up with your PCP for any new or concerning symptoms.

## 2022-11-18 ENCOUNTER — Other Ambulatory Visit: Payer: Self-pay | Admitting: Cardiovascular Disease

## 2022-12-09 ENCOUNTER — Ambulatory Visit (HOSPITAL_COMMUNITY): Payer: Medicare Other | Attending: Cardiology

## 2022-12-09 DIAGNOSIS — I5189 Other ill-defined heart diseases: Secondary | ICD-10-CM | POA: Diagnosis not present

## 2022-12-09 DIAGNOSIS — I358 Other nonrheumatic aortic valve disorders: Secondary | ICD-10-CM | POA: Diagnosis not present

## 2022-12-09 LAB — ECHOCARDIOGRAM COMPLETE
Area-P 1/2: 3.26 cm2
S' Lateral: 2.9 cm

## 2023-01-06 ENCOUNTER — Ambulatory Visit: Payer: Medicare Other | Admitting: Podiatry

## 2023-01-06 DIAGNOSIS — R3914 Feeling of incomplete bladder emptying: Secondary | ICD-10-CM | POA: Diagnosis not present

## 2023-01-06 DIAGNOSIS — R350 Frequency of micturition: Secondary | ICD-10-CM | POA: Diagnosis not present

## 2023-01-06 DIAGNOSIS — M659 Synovitis and tenosynovitis, unspecified: Secondary | ICD-10-CM | POA: Diagnosis not present

## 2023-01-06 DIAGNOSIS — M19071 Primary osteoarthritis, right ankle and foot: Secondary | ICD-10-CM

## 2023-01-06 NOTE — Progress Notes (Signed)
Subjective:  Patient ID: Theodore Davis, male    DOB: 02-03-1946,  MRN: [id number]  Chief Complaint  Patient presents with   Plantar Fasciitis    77 y.o. male presents with the above complaint.  Patient presents with right midfoot pain that came out of nowhere.  He has a history of Planter fasciitis which is not causing him any pain.  He would like to discuss treatment options for the right side.  He has not seen MRIs prior to seeing me pain scale 7 out of 10 dull achy in nature he states causing him pain especially with some type issues.  Denies any other acute complaints   Review of Systems: Negative except as noted in the HPI. Denies N/V/F/Ch.  Past Medical History:  Diagnosis Date   Arthritis 10/03/2010   right sternoclavicular joint   Heart murmur    Hemangioma of spine    lower lumbar   HOH (hard of hearing)    R ear   Hyperlipidemia    hypercholesterolemia   Lyme disease     Current Outpatient Medications:    Magnesium Gluconate 550 MG TABS, Take 225 mg by mouth daily., Disp: , Rfl:    Magnesium Oxide 200 MG TABS, Take 400 mg by mouth at bedtime., Disp: , Rfl:    metoprolol succinate (TOPROL XL) 25 MG 24 hr tablet, Take 0.5 tablets (12.5 mg total) by mouth daily., Disp: 45 tablet, Rfl: 3   predniSONE (DELTASONE) 20 MG tablet, 3 tabs poqday 1-2, 2 tabs poqday 3-4, 1 tab poqday 5-6, Disp: 12 tablet, Rfl: 0   silodosin (RAPAFLO) 8 MG CAPS capsule, Take 1 capsule (8 mg total) by mouth every other day, Disp: 45 capsule, Rfl: 3   simvastatin (ZOCOR) 40 MG tablet, Take 1 tablet (40 mg total) by mouth daily at 6 PM. NEED OV., Disp: 30 tablet, Rfl: 1  Social History   Tobacco Use  Smoking Status Former   Current packs/day: 0.00   Average packs/day: 0.3 packs/day for 50.0 years (12.5 ttl pk-yrs)   Types: Cigarettes   Start date: 06/24/1947   Quit date: 06/23/1997   Years since quitting: 25.5  Smokeless Tobacco Former    Allergies  Allergen Reactions   Codeine Nausea And  Vomiting   Lipitor [Atorvastatin Calcium] Other (See Comments)    myalgias   Objective:  There were no vitals filed for this visit. There is no height or weight on file to calculate BMI. Constitutional Well developed. Well nourished.  Vascular Dorsalis pedis pulses palpable bilaterally. Posterior tibial pulses palpable bilaterally. Capillary refill normal to all digits.  No cyanosis or clubbing noted. Pedal hair growth normal.  Neurologic Normal speech. Oriented to person, place, and time. Epicritic sensation to light touch grossly present bilaterally.  Dermatologic Nails well groomed and normal in appearance. No open wounds. No skin lesions.  Orthopedic: Pain on palpation of right midfoot lateral side.  No pain with extensor tendinitis" along the course of the extensor tendon.  No pain with resisted dorsiflexion of the digits.  No pain with resisted plantarflexion of the digits.   Radiographs: None Assessment:   1. Arthritis of right midfoot   2. Synovitis of right foot    Plan:  Patient was evaluated and treated and all questions answered.  Right midfoot arthritis with underlying synovitis -All questions and concerns were discussed with the patient extensive detail given the amount of pain that he is having a benefit from a steroid injection of decrease inflammatory component  surgical pain.  Patient was implanted to proceed with steroid injection -A steroid injection was performed at right lateral midfoot using 1% plain Lidocaine and 10 mg of Kenalog. This was well tolerated. -I discussed shoe gear modification with the patient in extensive detail he states understanding  No follow-ups on file.

## 2023-01-13 ENCOUNTER — Encounter: Payer: Self-pay | Admitting: Family Medicine

## 2023-01-13 ENCOUNTER — Other Ambulatory Visit: Payer: Self-pay | Admitting: Family Medicine

## 2023-01-13 ENCOUNTER — Ambulatory Visit (INDEPENDENT_AMBULATORY_CARE_PROVIDER_SITE_OTHER): Payer: Medicare Other | Admitting: Family Medicine

## 2023-01-13 VITALS — BP 140/70 | HR 94 | Temp 97.8°F | Ht 69.0 in | Wt 193.0 lb

## 2023-01-13 DIAGNOSIS — H60331 Swimmer's ear, right ear: Secondary | ICD-10-CM | POA: Diagnosis not present

## 2023-01-13 MED ORDER — NEOMYCIN-POLYMYXIN-HC 3.5-10000-1 OT SOLN: 4.0000 [drp] | Freq: Four times a day (QID) | OTIC | 0 refills | Status: DC

## 2023-01-13 MED ORDER — NEOMYCIN-POLYMYXIN-HC 3.5-10000-1 OP SUSP: 3.0000 [drp] | Freq: Four times a day (QID) | OPHTHALMIC | 0 refills | Status: DC

## 2023-01-13 NOTE — Assessment & Plan Note (Signed)
Symptoms and exam consistent with acute otitis externa. Start Cortisporin 3 drops 4 times daily for 7 days. Instructed to keep ear dry and avoid hearing aid use until ear is healed. Return to office if symptoms persist or worsen.

## 2023-01-13 NOTE — Addendum Note (Signed)
Addended by: Park Meo on: 01/13/2023 10:47 AM   Modules accepted: Orders

## 2023-01-13 NOTE — Progress Notes (Signed)
Subjective:  HPI: Theodore Davis is a 77 y.o. male presenting on 01/13/2023 for Follow-up (Right ear issues)   HPI Patient is in today for 3 days of right ear itching after getting water or sweat in his ear. The canal is painful and he hears a "swooshing" sound when he lies on his right side. Denies drainage, fever, body aches, chills.  Review of Systems  All other systems reviewed and are negative.   Relevant past medical history reviewed and updated as indicated.   Past Medical History:  Diagnosis Date   Arthritis 10/03/2010   right sternoclavicular joint   Heart murmur    Hemangioma of spine    lower lumbar   HOH (hard of hearing)    R ear   Hyperlipidemia    hypercholesterolemia   Lyme disease      Past Surgical History:  Procedure Laterality Date   BIOPSY  08/28/2021   Procedure: BIOPSY;  Surgeon: Dolores Frame, MD;  Location: [facility];  Service: Gastroenterology;;   CARDIOVASCULAR STRESS TEST  08/2010   CHOLECYSTECTOMY     COLONOSCOPY WITH PROPOFOL N/A 08/28/2021   Procedure: COLONOSCOPY WITH PROPOFOL;  Surgeon: Dolores Frame, MD;  Location: [facility];  Service: Gastroenterology;  Laterality: N/A;  1045  ASA 1   ENDOSCOPIC MUCOSAL RESECTION  09/18/2021   Procedure: ENDOSCOPIC MUCOSAL RESECTION;  Surgeon: Meridee Score Netty Starring., MD;  Location: [facility];  Service: Gastroenterology;;   EUS N/A 09/18/2021   Procedure: LOWER ENDOSCOPIC ULTRASOUND (EUS);  Surgeon: Lemar Lofty., MD;  Location: [facility];  Service: Gastroenterology;  Laterality: N/A;   FLEXIBLE SIGMOIDOSCOPY N/A 09/18/2021   Procedure: FLEXIBLE SIGMOIDOSCOPY;  Surgeon: Meridee Score Netty Starring., MD;  Location: [facility];  Service: Gastroenterology;  Laterality: N/A;   HEMOSTASIS CLIP PLACEMENT  09/18/2021   Procedure: HEMOSTASIS CLIP PLACEMENT;  Surgeon: Meridee Score Netty Starring., MD;  Location: [facility];  Service: Gastroenterology;;   HERNIA REPAIR     R  inguinal   JOINT REPLACEMENT Right    MASTOID DEBRIDEMENT     R ear   POLYPECTOMY  08/28/2021   Procedure: POLYPECTOMY;  Surgeon: Dolores Frame, MD;  [facility];  Service: Gastroenterology;;   POLYPECTOMY  09/18/2021   Procedure: POLYPECTOMY;  Surgeon: Lemar Lofty., MD;  Location: [facility];  Service: Gastroenterology;;   RESECTION DISTAL CLAVICAL Left 11/30/2014   Procedure: RESECTION DISTAL CLAVICAL;  Surgeon: Mckinley Jewel, MD;  Location: [facility];  Service: Orthopedics;  Laterality: Left;   SHOULDER ARTHROSCOPY WITH BICEPSTENOTOMY Left 11/30/2014   Procedure: SHOULDER ARTHROSCOPY WITH BICEPSTENOTOMY;  Surgeon: Mckinley Jewel, MD;  Location: [facility];  Service: Orthopedics;  Laterality: Left;   SHOULDER ARTHROSCOPY WITH ROTATOR CUFF REPAIR AND SUBACROMIAL DECOMPRESSION Left 11/30/2014   Procedure: LEFT SHOULDER ARTHROSCOPY WITH DISTAL CLAVICAL EXCISION, ACROMIOPLASTY, ROTATOR CUFF REPAIR , BICEPS TENOTOMY;  Surgeon: Mckinley Jewel, MD;  Location: [facility];  Service: Orthopedics;  Laterality: Left;   SUBMUCOSAL LIFTING INJECTION  09/18/2021   Procedure: SUBMUCOSAL LIFTING INJECTION;  Surgeon: Lemar Lofty., MD;  Location: [facility];  Service: Gastroenterology;;   TENDON REPAIR  2005   R elbow   TOTAL KNEE ARTHROPLASTY  05/28/2011   Procedure: TOTAL KNEE ARTHROPLASTY;  Surgeon: Loreta Ave, MD;  Location: [facility];  Service: Orthopedics;  Laterality: Right;  Right total knee arthroplasty    US ECHOCARDIOGRAPHY  08/2010    Allergies and medications reviewed and updated.  Current Outpatient Medications:    Magnesium Gluconate 550 MG TABS, Take 225 mg by mouth daily., Disp: , Rfl:    Magnesium Oxide 200 MG TABS, Take 400 mg by mouth at bedtime., Disp: , Rfl:    metoprolol succinate (TOPROL XL) 25 MG 24 hr tablet, Take 0.5 tablets (12.5 mg total) by mouth daily., Disp: 45 tablet, Rfl: 3    neomycin-polymyxin-hydrocortisone (CORTISPORIN) 3.5-10000-1 ophthalmic suspension, Place 3 drops into the right eye 4 (four) times daily for 7 days., Disp: 4.2 mL, Rfl: 0   silodosin (RAPAFLO) 8 MG CAPS capsule, Take 1 capsule (8 mg total) by mouth every other day, Disp: 45 capsule, Rfl: 3   simvastatin (ZOCOR) 40 MG tablet, Take 1 tablet (40 mg total) by mouth daily at 6 PM. NEED OV., Disp: 30 tablet, Rfl: 1   predniSONE (DELTASONE) 20 MG tablet, 3 tabs poqday 1-2, 2 tabs poqday 3-4, 1 tab poqday 5-6 (Patient not taking: Reported on 01/13/2023), Disp: 12 tablet, Rfl: 0  Allergies  Allergen Reactions   Codeine Nausea And Vomiting   Lipitor [Atorvastatin Calcium] Other (See Comments)    myalgias    Objective:   BP (!) 140/70   Pulse 94   Temp 97.8 F (36.6 C) (Oral)   Ht 5\' 9"  (1.753 m)   Wt 193 lb (87.5 kg)   SpO2 95%   BMI 28.50 kg/m      01/13/2023    9:26 AM 09/12/2022    8:06 AM 08/19/2022    9:48 AM  Vitals with BMI  Height 5\' 9"  5\' 9"  5\' 9"   Weight 193 lbs 197 lbs 10 oz 198 lbs  BMI 28.49 29.17 29.23  Systolic 140 136 284  Diastolic 70 72 64  Pulse 94 65 59     Physical Exam Vitals and nursing note reviewed.  Constitutional:      Appearance: Normal appearance. He is normal weight.  HENT:     Head: Normocephalic and atraumatic.     Right Ear: External ear normal. Swelling and tenderness present. A middle ear effusion is present.     Left Ear: Tympanic membrane, ear canal and external ear normal.     Ears:     Comments: Redness with yellow exudate to canal Skin:    General: Skin is warm and dry.     Capillary Refill: Capillary refill takes less than 2 seconds.  Neurological:     General: No focal deficit present.     Mental Status: He is alert and oriented to person, place, and time. Mental status is at baseline.  Psychiatric:        Mood and Affect: Mood normal.        Behavior: Behavior normal.        Thought Content: Thought content normal.         Judgment: Judgment normal.     Assessment & Plan:  Acute swimmer's ear of right side Assessment & Plan: Symptoms and exam consistent with acute otitis externa. Start Cortisporin 3 drops 4 times daily for 7 days. Instructed to keep ear dry and avoid hearing aid use until ear is healed. Return to office if symptoms persist or worsen.   Other orders -     Neomycin-Polymyxin-HC; Place 3 drops into the right eye 4 (four) times daily for 7 days.  Dispense: 4.2 mL; Refill: 0     Follow up plan: Return if symptoms worsen or fail to improve.  Park Meo, FNP

## 2023-01-16 ENCOUNTER — Ambulatory Visit (INDEPENDENT_AMBULATORY_CARE_PROVIDER_SITE_OTHER): Payer: Medicare Other | Admitting: Family Medicine

## 2023-01-16 ENCOUNTER — Encounter: Payer: Self-pay | Admitting: Family Medicine

## 2023-01-16 VITALS — BP 126/72 | HR 47 | Temp 97.5°F | Ht 69.0 in | Wt 193.0 lb

## 2023-01-16 DIAGNOSIS — H60331 Swimmer's ear, right ear: Secondary | ICD-10-CM

## 2023-01-16 MED ORDER — AMOXICILLIN-POT CLAVULANATE 875-125 MG PO TABS: 1.0000 | ORAL_TABLET | Freq: Two times a day (BID) | ORAL | 0 refills | Status: DC

## 2023-01-16 NOTE — Progress Notes (Signed)
Subjective:    Patient ID: Theodore Davis, male    DOB: [birth year], 77 y.o.   MRN: [id number]  Patient reports pain in his right ear.  2 days ago saw my partner was diagnosed with otitis externa and was started on Cortisporin HC otic.  Here today for follow-up.  The right ear canal is swollen erythematous with exudate.  The tympanic membrane is erythematous with streaks of blood. Past Medical History:  Diagnosis Date  . Arthritis 10/03/2010   right sternoclavicular joint  . Heart murmur   . Hemangioma of spine    lower lumbar  . HOH (hard of hearing)    R ear  . Hyperlipidemia    hypercholesterolemia  . Lyme disease    Past Surgical History:  Procedure Laterality Date  . BIOPSY  08/28/2021   Procedure: BIOPSY;  Surgeon: Dolores Frame, MD;  [facility]: [facility];  Service: Gastroenterology;;  . CARDIOVASCULAR STRESS TEST  08/2010  . CHOLECYSTECTOMY    . COLONOSCOPY WITH PROPOFOL N/A 08/28/2021   Procedure: COLONOSCOPY WITH PROPOFOL;  Surgeon: Dolores Frame, MD;  [facility]: [facility];  Service: Gastroenterology;  Laterality: N/A;  1045  ASA 1  . ENDOSCOPIC MUCOSAL RESECTION  09/18/2021   Procedure: ENDOSCOPIC MUCOSAL RESECTION;  Surgeon: Meridee Score Netty Starring., MD;  Location: [facility];  Service: Gastroenterology;;  . EUS N/A 09/18/2021   Procedure: LOWER ENDOSCOPIC ULTRASOUND (EUS);  Surgeon: Lemar Lofty., MD;  Location: [facility];  Service: Gastroenterology;  Laterality: N/A;  . FLEXIBLE SIGMOIDOSCOPY N/A 09/18/2021   Procedure: FLEXIBLE SIGMOIDOSCOPY;  Surgeon: Lemar Lofty., MD;  Location: [facility];  Service: Gastroenterology;  Laterality: N/A;  . HEMOSTASIS CLIP PLACEMENT  09/18/2021   Procedure: HEMOSTASIS CLIP PLACEMENT;  Surgeon: Lemar Lofty., MD;  Location: [facility];  Service: Gastroenterology;;  . HERNIA REPAIR     R inguinal  . JOINT REPLACEMENT Right   . MASTOID DEBRIDEMENT     R ear  . POLYPECTOMY   08/28/2021   Procedure: POLYPECTOMY;  Surgeon: Dolores Frame, MD;  Location: [facility];  Service: Gastroenterology;;  . POLYPECTOMY  09/18/2021   Procedure: POLYPECTOMY;  Surgeon: Lemar Lofty., MD;  Location: [facility];  Service: Gastroenterology;;  . RESECTION DISTAL CLAVICAL Left 11/30/2014   Procedure: RESECTION DISTAL CLAVICAL;  Surgeon: Mckinley Jewel, MD;  Location: [facility];  Service: Orthopedics;  Laterality: Left;  . SHOULDER ARTHROSCOPY WITH BICEPSTENOTOMY Left 11/30/2014   Procedure: SHOULDER ARTHROSCOPY WITH BICEPSTENOTOMY;  Surgeon: Mckinley Jewel, MD;  Location: [facility];  Service: Orthopedics;  Laterality: Left;  . SHOULDER ARTHROSCOPY WITH ROTATOR CUFF REPAIR AND SUBACROMIAL DECOMPRESSION Left 11/30/2014   Procedure: LEFT SHOULDER ARTHROSCOPY WITH DISTAL CLAVICAL EXCISION, ACROMIOPLASTY, ROTATOR CUFF REPAIR , BICEPS TENOTOMY;  Surgeon: Mckinley Jewel, MD;  [facility];  Service: Orthopedics;  Laterality: Left;  . SUBMUCOSAL LIFTING INJECTION  09/18/2021   Procedure: SUBMUCOSAL LIFTING INJECTION;  Surgeon: Meridee Score Netty Starring., MD;  Location: [facility];  Service: Gastroenterology;;  . TENDON REPAIR  2005   R elbow  . TOTAL KNEE ARTHROPLASTY  05/28/2011   Procedure: TOTAL KNEE ARTHROPLASTY;  Surgeon: Loreta Ave, MD;  Location: [facility];  Service: Orthopedics;  Laterality: Right;  Right total knee arthroplasty   . US ECHOCARDIOGRAPHY  08/2010   Current Outpatient Medications on File Prior to Visit  Medication Sig Dispense Refill  . Magnesium Gluconate 550 MG TABS Take 225 mg by mouth daily.    Marland Kitchen  Magnesium Oxide 200 MG TABS Take 400 mg by mouth at bedtime.    . metoprolol succinate (TOPROL XL) 25 MG 24 hr tablet Take 0.5 tablets (12.5 mg total) by mouth daily. 45 tablet 3  . neomycin-polymyxin-hydrocortisone (CORTISPORIN) OTIC solution Place 4 drops into the right ear 4 (four) times daily. 10 mL 0  .  silodosin (RAPAFLO) 8 MG CAPS capsule Take 1 capsule (8 mg total) by mouth every other day 45 capsule 3  . simvastatin (ZOCOR) 40 MG tablet Take 1 tablet (40 mg total) by mouth daily at 6 PM. NEED OV. 30 tablet 1  . predniSONE (DELTASONE) 20 MG tablet 3 tabs poqday 1-2, 2 tabs poqday 3-4, 1 tab poqday 5-6 (Patient not taking: Reported on 01/13/2023) 12 tablet 0  . [DISCONTINUED] promethazine (PHENERGAN) 25 MG tablet Take 1 tablet (25 mg total) by mouth every 8 (eight) hours as needed for nausea or vomiting. 20 tablet 0   No current facility-administered medications on file prior to visit.   Allergies  Allergen Reactions  . Codeine Nausea And Vomiting  . Lipitor [Atorvastatin Calcium] Other (See Comments)    myalgias   Social History   Socioeconomic History  . Marital status: Married    Spouse name: Not on file  . Number of children: 2  . Years of education: Not on file  . Highest education level: Not on file  Occupational History  . Not on file  Tobacco Use  . Smoking status: Former    Current packs/day: 0.00    Types: Cigarettes    Quit date: 06/23/1997    Years since quitting: 25.5  . Smokeless tobacco: Former  Advertising account planner  . Vaping status: Never Used  Substance and Sexual Activity  . Alcohol use: Yes    Comment: occasional beer  . Drug use: No  . Sexual activity: Yes  Other Topics Concern  . Not on file  Social History Narrative   [facility] (04/08/17):   Originally from [facility]. Has lived in [place] as well. Previously worked for the [employer], [employer] as a Curator. Has 2 dogs & a rabbit currently. No bird exposure. He reports black mold was found in his air conditioner at [facility] when it was changed out 2 years ago. No hot tub exposure. Enjoys fishing.    Married since 1974.    2 sons   3 grandchildren.   Social Determinants of Health   Financial Resource Strain: Low Risk  (06/24/2022)   Overall Financial Resource Strain (CARDIA)   . Difficulty of Paying Living  Expenses: Not hard at all  Food Insecurity: No Food Insecurity (06/24/2022)   Hunger Vital Sign   . Worried About Programme researcher, broadcasting/film/video in the Last Year: Never true   . Ran Out of Food in the Last Year: Never true  Transportation Needs: No Transportation Needs (06/24/2022)   PRAPARE - Transportation   . Lack of Transportation (Medical): No   . Lack of Transportation (Non-Medical): No  Physical Activity: Unknown (06/24/2022)   Exercise Vital Sign   . Days of Exercise per Week: 5 days   . Minutes of Exercise per Session: Not on file  Stress: No Stress Concern Present (06/24/2022)   [employer] of Occupational Health - Occupational Stress Questionnaire   . Feeling of Stress : Not at all  Social Connections: Socially Integrated (06/24/2022)   Social Connection and Isolation Panel [NHANES]   . Frequency of Communication with Friends and Family: More than three times a  week   . Frequency of Social Gatherings with Friends and Family: Three times a week   . Attends Religious Services: More than 4 times per year   . Active Member of Clubs or Organizations: Yes   . Attends Banker Meetings: More than 4 times per year   . Marital Status: Married  Catering manager Violence: Not At Risk (06/24/2022)   Humiliation, Afraid, Rape, and Kick questionnaire   . Fear of Current or Ex-Partner: No   . Emotionally Abused: No   . Physically Abused: No   . Sexually Abused: No   Family History  Problem Relation Age of Onset  . Heart disease Mother   . Atrial fibrillation Mother   . Heart disease Father   . Rheum arthritis Father   . Bladder Cancer Father   . Atrial fibrillation Father   . Brain cancer Maternal Uncle   . Lung disease Neg Hx      Review of Systems  All other systems reviewed and are negative.      Objective:   Physical Exam Vitals and nursing note reviewed.  Constitutional:      General: He is not in acute distress.    Appearance: Normal appearance. He is  well-developed. He is not ill-appearing, toxic-appearing or diaphoretic.  HENT:     Head: Normocephalic and atraumatic.     Right Ear: Ear canal normal. Drainage, swelling and tenderness present. No middle ear effusion. Tympanic membrane is erythematous.     Mouth/Throat:     Mouth: Mucous membranes are moist.     Pharynx: Oropharynx is clear. No oropharyngeal exudate or posterior oropharyngeal erythema.  Eyes:     Conjunctiva/sclera: Conjunctivae normal.  Cardiovascular:     Rate and Rhythm: Normal rate and regular rhythm.     Heart sounds: Normal heart sounds.  Pulmonary:     Effort: Pulmonary effort is normal. No respiratory distress.     Breath sounds: No wheezing or rhonchi.  Neurological:     Mental Status: He is alert.        Assessment & Plan:  Acute swimmer's ear of right side I encouraged the patient to give the drops more time to work.  It has not been 48 hours.  If not improving by Sunday he can add Augmentin 875 mg twice daily for 10 days

## 2023-02-10 ENCOUNTER — Telehealth: Payer: Self-pay | Admitting: Family Medicine

## 2023-02-10 NOTE — Telephone Encounter (Signed)
Received call from [employer] with [facility] to give a verbal report of patient's A1C result of 5.7 received today. Patient advised of need for diet and exercise, and prediabetic status. Please advise at [phone] with any questions.

## 2023-02-15 ENCOUNTER — Inpatient Hospital Stay: Admit: 2023-02-15 | Discharge: 2023-02-15 | Disposition: A | Discharge status: Home / Self Care | Payer: MEDICARE

## 2023-02-15 DIAGNOSIS — J40 Bronchitis, not specified as acute or chronic: Secondary | ICD-10-CM

## 2023-02-15 LAB — COVID-19, RAPID: SARS-CoV-2, Rapid: NOT DETECTED

## 2023-02-15 MED ORDER — METHYLPREDNISOLONE 4 MG PO TBPK
4 | PACK | ORAL | 0 refills | Status: AC
Start: 2023-02-15 — End: 2023-02-21

## 2023-02-15 MED ORDER — GUAIFENESIN-DM 100-10 MG/5ML PO SYRP
100-10 | Freq: Three times a day (TID) | ORAL | 0 refills | Status: AC | PRN
Start: 2023-02-15 — End: 2023-02-25

## 2023-02-15 MED ORDER — AZITHROMYCIN 250 MG PO TABS
250 | PACK | ORAL | 0 refills | Status: AC
Start: 2023-02-15 — End: 2023-02-19

## 2023-02-15 NOTE — ED Provider Notes (Signed)
Department of Emergency Medicine   ED  Encounter Note  Admit Date/RoomTime: 02/15/2023  6:37 PM  ED Room: 03/03    NAME: Troy Mcdaniel  DOB: Nov 03, 1945  MRN: [id number]     Chief Complaint:  Cough (Congestion  wheezing in chest  )    History of Present Illness       Troy Mcdaniel is a 77 y.o. old male who presents to urgent care by private vehicle, for cough, which began several day(s) prior to arrival.  Since onset the symptoms have been stable and mild-moderate in severity. The symptoms are associated with wheezing.  There has been no chest pain, shortness of breath, hemoptysis.    ROS   Pertinent positives and negatives are stated within HPI, all other systems reviewed and are negative.    Past Medical History:  has a past medical history of Asthma, Cancer (HCC), Hyperlipidemia, and Hypertension.    Surgical History:  has a past surgical history that includes Prostatectomy; Colonoscopy; Cataract extraction (Right); Pectoralis major repair; and Eye surgery (Left, 11/12/2021).    Social History:  reports that he has quit smoking. He has never used smokeless tobacco. He reports current alcohol use of about 1.0 standard drink of alcohol per week. He reports that he does not use drugs.    Family History: family history includes Cancer in his father; Dementia in his mother.     Allergies: Patient has no known allergies.    Physical Exam   Oxygen Saturation Interpretation: Normal on room air analysis.        ED Triage Vitals [02/15/23 1839]   BP Systolic BP Percentile Diastolic BP Percentile Temp Temp src Pulse Respirations SpO2   139/70 -- -- 98.6 F (37 C) -- 75 18 97 %      Height Weight         -- --               Constitutional:  Alert, development consistent with age.  Ears:  External Ears: Bilateral normal.               TM's & External Canals: normal TM's and external ear canals bilaterally.  Nose:   There is no discharge, swelling or lesions noted.  Sinuses: no Bilateral maxillary sinus tenderness.                     no Bilateral frontal sinus tenderness.  Mouth:  normal tongue and buccal mucosa.   Throat: no erythema or exudates noted. Teeth and gums normal..  Airway Patent.  Neck:  Supple. No meningeal signs. There is no  preauricular, anterior cervical, and posterior cervical node tenderness.  Respiratory:   Breath sounds: Present, diminished bilaterally.  CV:  Regular rate and rhythm, normal heart sounds, without pathological murmurs, ectopy, gallops, or rubs.  GI:  Abdomen Soft, nontender, good bowel sounds.  No firm or pulsatile mass.  Integument:  Normal turgor.  Warm, dry, without visible rash.  Neurological:  Oriented.  Motor functions intact.       Lab / Imaging Results   (All laboratory and radiology results have been personally reviewed by myself)  Labs:  Results for orders placed or performed during the hospital encounter of 02/15/23   COVID-19, Rapid    Specimen: Nasopharyngeal Swab   Result Value Ref Range    Specimen Description .NASOPHARYNGEAL SWAB     SARS-CoV-2, Rapid Not Detected Not Detected       Imaging:  All Radiology  results interpreted by Radiologist unless otherwise noted.  No orders to display       ED Course / Medical Decision Making   Medications - No data to display         Medical Decision Making:   Troy Mcdaniel presented to [facility] with complaints of Cough (Congestion  wheezing in chest  )    With low suspicion for pneumonia as per history/physical findings, imaging was not done.  SpO2 97% on room air.  Not in any respiratory distress.    With moderate suspicion for COVID-19, testing was obtained and were negative.     Based on history and examination findings of Troy Mcdaniel, the causative nature of illness may be Bacterial or Viral in etiology.  However he does have a history of asthma.  Therefore, antibiotics, symptomatic control with supportive meds, and steroids is considered appropriate at this time. He is not hypoxic.  Patient is well appearing, non toxic, meets criteria for and is  appropriate for outpatient management.    Normal progression of disease discussed.  Instruction provided in the use of fluids, vaporizer, acetaminophen, and other OTC medication for symptom control.  Extra fluids  Analgesics as needed, dosages and times reviewed.  Follow up as needed should symptoms fail to improve.  Present to ED for any new or worsening symptoms.    Assessment     1. Bronchitis      Plan   Discharged home.  Patient condition is good    New Medications     Discharge Medication List as of 02/15/2023  7:06 PM        START taking these medications    Details   azithromycin (ZITHROMAX Z-PAK) 250 MG tablet Take 2 tablets (500 mg) on Day 1, and then take 1 tablet (250 mg) on days 2 through 5., Disp-1 packet, R-0Normal      guaiFENesin-dextromethorphan (ROBITUSSIN DM) 100-10 MG/5ML syrup Take 10 mLs by mouth 3 times daily as needed for Cough, Disp-120 mL, R-0Normal      methylPREDNISolone (MEDROL, PAK,) 4 MG tablet Take as directed, Disp-1 kit, R-0Normal           Electronically signed by Henrietta Dine, APRN - CNP   DD: 02/15/23  **This report was transcribed using voice recognition software. Every effort was made to ensure accuracy; however, inadvertent computerized transcription errors may be present.  END OF ED PROVIDER NOTE         Ezekiel Slocumb, APRN - CNP  02/15/23 2012

## 2023-02-18 ENCOUNTER — Other Ambulatory Visit: Payer: Self-pay | Admitting: Cardiovascular Disease

## 2023-02-19 ENCOUNTER — Other Ambulatory Visit: Payer: Medicare Other

## 2023-02-19 DIAGNOSIS — R42 Dizziness and giddiness: Secondary | ICD-10-CM | POA: Diagnosis not present

## 2023-02-19 LAB — GLUCOSE 16585: Glucose: 106 mg/dL — ABNORMAL HIGH (ref 65–99)

## 2023-03-03 ENCOUNTER — Ambulatory Visit: Payer: Medicare Other | Admitting: Family Medicine

## 2023-03-09 ENCOUNTER — Ambulatory Visit (INDEPENDENT_AMBULATORY_CARE_PROVIDER_SITE_OTHER): Payer: Medicare Other | Admitting: Family Medicine

## 2023-03-09 ENCOUNTER — Encounter: Payer: Self-pay | Admitting: Family Medicine

## 2023-03-09 VITALS — BP 122/62 | HR 46 | Temp 97.5°F | Ht 69.0 in | Wt 192.6 lb

## 2023-03-09 DIAGNOSIS — R55 Syncope and collapse: Secondary | ICD-10-CM

## 2023-03-09 LAB — CBC WITH DIFFERENTIAL/PLATELET
Absolute Monocytes: 485 {cells}/uL (ref 200–950)
Basophils Absolute: 30 {cells}/uL (ref 0–200)
Basophils Relative: 0.6 %
Eosinophils Absolute: 130 {cells}/uL (ref 15–500)
Eosinophils Relative: 2.6 %
HCT: 47.1 % (ref 38.5–50.0)
Hemoglobin: 15.8 g/dL (ref 13.2–17.1)
Lymphs Abs: 1135 {cells}/uL (ref 850–3900)
MCH: 31 pg (ref 27.0–33.0)
MCHC: 33.5 g/dL (ref 32.0–36.0)
MCV: 92.5 fL (ref 80.0–100.0)
MPV: 11.3 fL (ref 7.5–12.5)
Monocytes Relative: 9.7 %
Neutro Abs: 3220 {cells}/uL (ref 1500–7800)
Neutrophils Relative %: 64.4 %
Platelets: 202 10*3/uL (ref 140–400)
RBC: 5.09 10*6/uL (ref 4.20–5.80)
RDW: 12.4 % (ref 11.0–15.0)
Total Lymphocyte: 22.7 %
WBC: 5 10*3/uL (ref 3.8–10.8)

## 2023-03-09 LAB — COMPLETE METABOLIC PANEL WITH GFR
AG Ratio: 1.8 (calc) (ref 1.0–2.5)
ALT: 22 U/L (ref 9–46)
AST: 21 U/L (ref 10–35)
Albumin: 4.2 g/dL (ref 3.6–5.1)
Alkaline phosphatase (APISO): 60 U/L (ref 35–144)
BUN: 13 mg/dL (ref 7–25)
CO2: 29 mmol/L (ref 20–32)
Calcium: 8.9 mg/dL (ref 8.6–10.3)
Chloride: 104 mmol/L (ref 98–110)
Creat: 0.82 mg/dL (ref 0.70–1.28)
Globulin: 2.4 g/dL (calc) (ref 1.9–3.7)
Glucose, Bld: 106 mg/dL — ABNORMAL HIGH (ref 65–99)
Potassium: 4.5 mmol/L (ref 3.5–5.3)
Sodium: 139 mmol/L (ref 135–146)
Total Bilirubin: 1.9 mg/dL — ABNORMAL HIGH (ref 0.2–1.2)
Total Protein: 6.6 g/dL (ref 6.1–8.1)
eGFR: 92 mL/min/{1.73_m2} (ref >=60)

## 2023-03-09 NOTE — Progress Notes (Signed)
Subjective:    Patient ID: Theodore Davis, male    DOB: [birth year], 77 y.o.   MRN: [id number]  Patient reports episode lightheadedness.  These are not related to movement or position changes.  There is no vertigo.  The patient states that he will like he is lightheaded and may pass out.  These occur spontaneously without warning.  They last several seconds and then slowly resolve.  They do not appear to be positional.  He denies any tachycardia or irregular heartbeats.  He denies any chest pain or shortness of breath or pleurisy.  He denies any seizure activity.  He denies any vertigo. Past Medical History:  Diagnosis Date  . Arthritis 10/03/2010   right sternoclavicular joint  . Heart murmur   . Hemangioma of spine    lower lumbar  . HOH (hard of hearing)    R ear  . Hyperlipidemia    hypercholesterolemia  . Lyme disease    Past Surgical History:  Procedure Laterality Date  . BIOPSY  08/28/2021   Procedure: BIOPSY;  Surgeon: Dolores Frame, MD;  [facility];  Service: Gastroenterology;;  . CARDIOVASCULAR STRESS TEST  08/2010  . CHOLECYSTECTOMY    . COLONOSCOPY WITH PROPOFOL N/A 08/28/2021   Procedure: COLONOSCOPY WITH PROPOFOL;  Surgeon: Dolores Frame, MD;  [facility]: [facility];  Service: Gastroenterology;  Laterality: N/A;  1045  ASA 1  . ENDOSCOPIC MUCOSAL RESECTION  09/18/2021   Procedure: ENDOSCOPIC MUCOSAL RESECTION;  Surgeon: Meridee Score Netty Starring., MD;  Location: [facility];  Service: Gastroenterology;;  . EUS N/A 09/18/2021   Procedure: LOWER ENDOSCOPIC ULTRASOUND (EUS);  Surgeon: Lemar Lofty., MD;  Location: [facility];  Service: Gastroenterology;  Laterality: N/A;  . FLEXIBLE SIGMOIDOSCOPY N/A 09/18/2021   Procedure: FLEXIBLE SIGMOIDOSCOPY;  Surgeon: Lemar Lofty., MD;  Location: [facility];  Service: Gastroenterology;  Laterality: N/A;  . HEMOSTASIS CLIP PLACEMENT  09/18/2021   Procedure: HEMOSTASIS CLIP  PLACEMENT;  Surgeon: Lemar Lofty., MD;  Location: [facility];  Service: Gastroenterology;;  . HERNIA REPAIR     R inguinal  . JOINT REPLACEMENT Right   . MASTOID DEBRIDEMENT     R ear  . POLYPECTOMY  08/28/2021   Procedure: POLYPECTOMY;  Surgeon: Dolores Frame, MD;  [facility];  Service: Gastroenterology;;  . POLYPECTOMY  09/18/2021   Procedure: POLYPECTOMY;  Surgeon: Lemar Lofty., MD;  Location: [facility];  Service: Gastroenterology;;  . RESECTION DISTAL CLAVICAL Left 11/30/2014   Procedure: RESECTION DISTAL CLAVICAL;  Surgeon: Mckinley Jewel, MD;  Location: [facility];  Service: Orthopedics;  Laterality: Left;  . SHOULDER ARTHROSCOPY WITH BICEPSTENOTOMY Left 11/30/2014   Procedure: SHOULDER ARTHROSCOPY WITH BICEPSTENOTOMY;  Surgeon: Mckinley Jewel, MD;  Location: [facility];  Service: Orthopedics;  Laterality: Left;  . SHOULDER ARTHROSCOPY WITH ROTATOR CUFF REPAIR AND SUBACROMIAL DECOMPRESSION Left 11/30/2014   Procedure: LEFT SHOULDER ARTHROSCOPY WITH DISTAL CLAVICAL EXCISION, ACROMIOPLASTY, ROTATOR CUFF REPAIR , BICEPS TENOTOMY;  Surgeon: Mckinley Jewel, MD;  Location:  SURGERY CENTER;  Service: Orthopedics;  Laterality: Left;  . SUBMUCOSAL LIFTING INJECTION  09/18/2021   Procedure: SUBMUCOSAL LIFTING INJECTION;  Surgeon: Meridee Score Netty Starring., MD;  Location: [facility];  Service: Gastroenterology;;  . TENDON REPAIR  2005   R elbow  . TOTAL KNEE ARTHROPLASTY  05/28/2011   Procedure: TOTAL KNEE ARTHROPLASTY;  Surgeon: Loreta Ave, MD;  Location: [facility];  Service: Orthopedics;  Laterality: Right;  Right total knee  arthroplasty   . US ECHOCARDIOGRAPHY  08/2010   Current Outpatient Medications on File Prior to Visit  Medication Sig Dispense Refill  . Magnesium Gluconate 550 MG TABS Take 225 mg by mouth daily.    . Magnesium Oxide 200 MG TABS Take 400 mg by mouth at bedtime.    . metoprolol succinate  (TOPROL XL) 25 MG 24 hr tablet Take 0.5 tablets (12.5 mg total) by mouth daily. 45 tablet 3  . silodosin (RAPAFLO) 8 MG CAPS capsule Take 1 capsule (8 mg total) by mouth every other day 45 capsule 3  . simvastatin (ZOCOR) 40 MG tablet TAKE 1 TABLET (40 MG TOTAL) BY MOUTH DAILY AT 6 PM. NEED OV. 90 tablet 1  . [DISCONTINUED] promethazine (PHENERGAN) 25 MG tablet Take 1 tablet (25 mg total) by mouth every 8 (eight) hours as needed for nausea or vomiting. 20 tablet 0   No current facility-administered medications on file prior to visit.   Allergies  Allergen Reactions  . Codeine Nausea And Vomiting  . Lipitor [Atorvastatin Calcium] Other (See Comments)    myalgias   Social History   Socioeconomic History  . Marital status: Married    Spouse name: Not on file  . Number of children: 2  . Years of education: Not on file  . Highest education level: Not on file  Occupational History  . Not on file  Tobacco Use  . Smoking status: Former    Current packs/day: 0.00    Types: Cigarettes    Quit date: 06/23/1997    Years since quitting: 25.7  . Smokeless tobacco: Former  Advertising account planner  . Vaping status: Never Used  Substance and Sexual Activity  . Alcohol use: Yes    Comment: occasional beer  . Drug use: No  . Sexual activity: Yes  Other Topics Concern  . Not on file  Social History Narrative   [place] [facility] (04/08/17):   Originally from [facility]. Has lived in [place] as well. Previously worked for the [employer] as a Curator. Has 2 dogs & a rabbit currently. No bird exposure. He reports black mold was found in his air conditioner at [facility] when it was changed out 2 years ago. No hot tub exposure. Enjoys fishing.    Married since 1974.    2 sons   3 grandchildren.   Social Determinants of Health   Financial Resource Strain: Low Risk  (06/24/2022)   Overall Financial Resource Strain (CARDIA)   . Difficulty of Paying Living Expenses: Not hard at all  Food Insecurity: No Food  Insecurity (06/24/2022)   Hunger Vital Sign   . Worried About Programme researcher, broadcasting/film/video in the Last Year: Never true   . Ran Out of Food in the Last Year: Never true  Transportation Needs: No Transportation Needs (06/24/2022)   PRAPARE - Transportation   . Lack of Transportation (Medical): No   . Lack of Transportation (Non-Medical): No  Physical Activity: Unknown (06/24/2022)   Exercise Vital Sign   . Days of Exercise per Week: 5 days   . Minutes of Exercise per Session: Not on file  Stress: No Stress Concern Present (06/24/2022)   [employer] of Occupational Health - Occupational Stress Questionnaire   . Feeling of Stress : Not at all  Social Connections: Socially Integrated (06/24/2022)   Social Connection and Isolation Panel [NHANES]   . Frequency of Communication with Friends and Family: More than three times a week   . Frequency of Social Gatherings  with Friends and Family: Three times a week   . Attends Religious Services: More than 4 times per year   . Active Member of Clubs or Organizations: Yes   . Attends Banker Meetings: More than 4 times per year   . Marital Status: Married  Catering manager Violence: Not At Risk (06/24/2022)   Humiliation, Afraid, Rape, and Kick questionnaire   . Fear of Current or Ex-Partner: No   . Emotionally Abused: No   . Physically Abused: No   . Sexually Abused: No   Family History  Problem Relation Age of Onset  . Heart disease Mother   . Atrial fibrillation Mother   . Heart disease Father   . Rheum arthritis Father   . Bladder Cancer Father   . Atrial fibrillation Father   . Brain cancer Maternal Uncle   . Lung disease Neg Hx      Review of Systems  All other systems reviewed and are negative.      Objective:   Physical Exam Vitals and nursing note reviewed.  Constitutional:      General: He is not in acute distress.    Appearance: He is well-developed. He is not diaphoretic.  Cardiovascular:     Rate and Rhythm: Regular  rhythm. Bradycardia present.     Heart sounds: Normal heart sounds.  Pulmonary:     Effort: Pulmonary effort is normal. No respiratory distress.     Breath sounds: Normal breath sounds. No wheezing.  Musculoskeletal:     Right lower leg: No edema.     Left lower leg: No edema.  Neurological:     General: No focal deficit present.     Mental Status: He is oriented to person, place, and time. Mental status is at baseline.     Cranial Nerves: No cranial nerve deficit.     Sensory: No sensory deficit.     Motor: No weakness.     Coordination: Coordination normal.     Gait: Gait normal.     Deep Tendon Reflexes: Reflexes normal.        Assessment & Plan:  Near syncope - Plan: CBC with Differential/Platelet, COMPLETE METABOLIC PANEL WITH GFR I suspect that this is due to hypotension coupled with bradycardia and dehydration.  I have asked the patient to temporarily hold Rapaflo and metoprolol and drink more water.  Patient states this has been going on for 6 months and occurs 2 times every week.  We will see if symptoms improve his blood pressure improves.  If they not I will amend her Doppler and a Zio patch monitor

## 2023-03-31 ENCOUNTER — Ambulatory Visit: Payer: Medicare Other | Admitting: Family Medicine

## 2023-03-31 ENCOUNTER — Other Ambulatory Visit (HOSPITAL_COMMUNITY): Payer: Self-pay

## 2023-03-31 VITALS — BP 132/78 | HR 56 | Temp 97.7°F | Ht 69.0 in | Wt 192.0 lb

## 2023-03-31 DIAGNOSIS — J069 Acute upper respiratory infection, unspecified: Secondary | ICD-10-CM | POA: Diagnosis not present

## 2023-03-31 MED ORDER — HYDROCODONE BIT-HOMATROP MBR 5-1.5 MG/5ML PO SOLN
5.0000 mL | Freq: Three times a day (TID) | ORAL | 0 refills | Status: AC | PRN
  Filled 2023-03-31: qty 120, 8d supply, fill #0

## 2023-03-31 NOTE — Progress Notes (Signed)
Subjective:    Patient ID: Theodore Davis, male    DOB: Nov 23, 1945, 77 y.o.   MRN: [id number]  Symptoms began 1 day ago.  This included copious rhinorrhea, head congestion, postnasal drip.  He can feel the snot and mucus running down his throat.  This is causing him to cough.  This kept him awake all night long.  He has to sit up simply to sleep.  He denies any chest pain or shortness of breath or dyspnea on exertion.  He denies any pleurisy or hemoptysis or purulent sputum.  He denies any fevers or chills or bodyaches. Past Medical History:  Diagnosis Date   Arthritis 10/03/2010   right sternoclavicular joint   Heart murmur    Hemangioma of spine    lower lumbar   HOH (hard of hearing)    R ear   Hyperlipidemia    hypercholesterolemia   Lyme disease    Past Surgical History:  Procedure Laterality Date   BIOPSY  08/28/2021   Procedure: BIOPSY;  Surgeon: Dolores Frame, MD;  [facility];  Service: Gastroenterology;;   CARDIOVASCULAR STRESS TEST  08/2010   CHOLECYSTECTOMY     COLONOSCOPY WITH PROPOFOL N/A 08/28/2021   Procedure: COLONOSCOPY WITH PROPOFOL;  Surgeon: Dolores Frame, MD;  [facility];  Service: Gastroenterology;  Laterality: N/A;  1045  ASA 1   ENDOSCOPIC MUCOSAL RESECTION  09/18/2021   Procedure: ENDOSCOPIC MUCOSAL RESECTION;  Surgeon: Meridee Score Netty Starring., MD;  Location: [facility];  Service: Gastroenterology;;   EUS N/A 09/18/2021   Procedure: LOWER ENDOSCOPIC ULTRASOUND (EUS);  Surgeon: Lemar Lofty., MD;  Location: [facility];  Service: Gastroenterology;  Laterality: N/A;   FLEXIBLE SIGMOIDOSCOPY N/A 09/18/2021   Procedure: FLEXIBLE SIGMOIDOSCOPY;  Surgeon: Meridee Score Netty Starring., MD;  Location: [facility];  Service: Gastroenterology;  Laterality: N/A;   HEMOSTASIS CLIP PLACEMENT  09/18/2021   Procedure: HEMOSTASIS CLIP PLACEMENT;  Surgeon: Meridee Score Netty Starring., MD;  Location: [facility];  Service:  Gastroenterology;;   HERNIA REPAIR     R inguinal   JOINT REPLACEMENT Right    MASTOID DEBRIDEMENT     R ear   POLYPECTOMY  08/28/2021   Procedure: POLYPECTOMY;  Surgeon: Dolores Frame, MD;  [facility];  Service: Gastroenterology;;   POLYPECTOMY  09/18/2021   Procedure: POLYPECTOMY;  Surgeon: Lemar Lofty., MD;  Location: [facility];  Service: Gastroenterology;;   RESECTION DISTAL CLAVICAL Left 11/30/2014   Procedure: RESECTION DISTAL CLAVICAL;  Surgeon: Mckinley Jewel, MD;  Location: [facility];  Service: Orthopedics;  Laterality: Left;   SHOULDER ARTHROSCOPY WITH BICEPSTENOTOMY Left 11/30/2014   Procedure: SHOULDER ARTHROSCOPY WITH BICEPSTENOTOMY;  Surgeon: Mckinley Jewel, MD;  Location: [facility];  Service: Orthopedics;  Laterality: Left;   SHOULDER ARTHROSCOPY WITH ROTATOR CUFF REPAIR AND SUBACROMIAL DECOMPRESSION Left 11/30/2014   Procedure: LEFT SHOULDER ARTHROSCOPY WITH DISTAL CLAVICAL EXCISION, ACROMIOPLASTY, ROTATOR CUFF REPAIR , BICEPS TENOTOMY;  Surgeon: Mckinley Jewel, MD;  Location: [facility];  Service: Orthopedics;  Laterality: Left;   SUBMUCOSAL LIFTING INJECTION  09/18/2021   Procedure: SUBMUCOSAL LIFTING INJECTION;  Surgeon: Lemar Lofty., MD;  Location: [facility];  Service: Gastroenterology;;   TENDON REPAIR  2005   R elbow   TOTAL KNEE ARTHROPLASTY  05/28/2011   Procedure: TOTAL KNEE ARTHROPLASTY;  Surgeon: Loreta Ave, MD;  Location: [facility];  Service: Orthopedics;  Laterality: Right;  Right total knee arthroplasty    US  ECHOCARDIOGRAPHY  08/2010   Current Outpatient Medications on File Prior to Visit  Medication Sig Dispense Refill   Magnesium Gluconate 550 MG TABS Take 225 mg by mouth daily.     Magnesium Oxide 200 MG TABS Take 400 mg by mouth at bedtime.     metoprolol succinate (TOPROL XL) 25 MG 24 hr tablet Take 0.5 tablets (12.5 mg total) by mouth daily. 45 tablet 3   silodosin  (RAPAFLO) 8 MG CAPS capsule Take 1 capsule (8 mg total) by mouth every other day 45 capsule 3   simvastatin (ZOCOR) 40 MG tablet TAKE 1 TABLET (40 MG TOTAL) BY MOUTH DAILY AT 6 PM. NEED OV. 90 tablet 1   [DISCONTINUED] promethazine (PHENERGAN) 25 MG tablet Take 1 tablet (25 mg total) by mouth every 8 (eight) hours as needed for nausea or vomiting. 20 tablet 0   No current facility-administered medications on file prior to visit.   Allergies  Allergen Reactions   Codeine Nausea And Vomiting   Lipitor [Atorvastatin Calcium] Other (See Comments)    myalgias   Social History   Socioeconomic History   Marital status: Married    Spouse name: Not on file   Number of children: 2   Years of education: Not on file   Highest education level: Not on file  Occupational History   Not on file  Tobacco Use   Smoking status: Former    Current packs/day: 0.00    Types: Cigarettes    Quit date: 06/23/1997    Years since quitting: 25.7   Smokeless tobacco: Former  Building services engineer status: Never Used  Substance and Sexual Activity   Alcohol use: Yes    Comment: occasional beer   Drug use: No   Sexual activity: Yes  Other Topics Concern   Not on file  Social History Narrative   [place] [facility] (04/08/17):   Originally from [facility]. Has lived in [place] as well. Previously worked for the [employer], [employer] as a Curator. Has 2 dogs & a rabbit currently. No bird exposure. He reports black mold was found in his air conditioner at [facility] when it was changed out 2 years ago. No hot tub exposure. Enjoys fishing.    Married since 1974.    2 sons   3 grandchildren.   Social Determinants of Health   Financial Resource Strain: Low Risk  (06/24/2022)   Overall Financial Resource Strain (CARDIA)    Difficulty of Paying Living Expenses: Not hard at all  Food Insecurity: No Food Insecurity (06/24/2022)   Hunger Vital Sign    Worried About Running Out of Food in the Last Year: Never true    Ran Out of Food  in the Last Year: Never true  Transportation Needs: No Transportation Needs (06/24/2022)   PRAPARE - Administrator, Civil Service (Medical): No    Lack of Transportation (Non-Medical): No  Physical Activity: Unknown (06/24/2022)   Exercise Vital Sign    Days of Exercise per Week: 5 days    Minutes of Exercise per Session: Not on file  Stress: No Stress Concern Present (06/24/2022)   [employer] of Occupational Health - Occupational Stress Questionnaire    Feeling of Stress : Not at all  Social Connections: Socially Integrated (06/24/2022)   Social Connection and Isolation Panel [NHANES]    Frequency of Communication with Friends and Family: More than three times a week    Frequency of Social Gatherings with Friends and Family: Three  times a week    Attends Religious Services: More than 4 times per year    Active Member of Clubs or Organizations: Yes    Attends Banker Meetings: More than 4 times per year    Marital Status: Married  Catering manager Violence: Not At Risk (06/24/2022)   Humiliation, Afraid, Rape, and Kick questionnaire    Fear of Current or Ex-Partner: No    Emotionally Abused: No    Physically Abused: No    Sexually Abused: No   Family History  Problem Relation Age of Onset   Heart disease Mother    Atrial fibrillation Mother    Heart disease Father    Rheum arthritis Father    Bladder Cancer Father    Atrial fibrillation Father    Brain cancer Maternal Uncle    Lung disease Neg Hx      Review of Systems  All other systems reviewed and are negative.      Objective:   Physical Exam Vitals and nursing note reviewed.  Constitutional:      General: He is not in acute distress.    Appearance: He is well-developed. He is not diaphoretic.  HENT:     Right Ear: Tympanic membrane and ear canal normal.     Left Ear: Tympanic membrane and ear canal normal.     Nose: Congestion and rhinorrhea present.     Mouth/Throat:     Pharynx: No  oropharyngeal exudate or posterior oropharyngeal erythema.  Eyes:     Conjunctiva/sclera: Conjunctivae normal.  Cardiovascular:     Rate and Rhythm: Regular rhythm. Bradycardia present.     Heart sounds: Murmur heard.  Pulmonary:     Effort: Pulmonary effort is normal. No respiratory distress.     Breath sounds: Normal breath sounds. No wheezing, rhonchi or rales.  Musculoskeletal:     Right lower leg: No edema.     Left lower leg: No edema.  Lymphadenopathy:     Cervical: No cervical adenopathy.  Neurological:     General: No focal deficit present.     Mental Status: He is oriented to person, place, and time. Mental status is at baseline.     Cranial Nerves: No cranial nerve deficit.     Sensory: No sensory deficit.     Motor: No weakness.     Coordination: Coordination normal.     Gait: Gait normal.     Deep Tendon Reflexes: Reflexes normal.         Assessment & Plan:  Upper respiratory tract infection, unspecified type Patient has a viral upper respiratory infection.  I recommended that he go home and take home COVID test to rule out COVID-19 as a cause of the rhinorrhea and head congestion.  If the COVID test is negative, I will treat the patient symptomatically with Coricidin HBP for head congestion.  He can also use Afrin for rhinorrhea for up to 3 days.  I will give him Hycodan 1 teaspoon every 6 hours as needed for cough.  Anticipate gradual improvement in symptoms over the next 7 days.  Recheck if he is getting sooner.  If he test positive for COVID I would recommend trying Paxlovid.

## 2023-04-17 ENCOUNTER — Other Ambulatory Visit (HOSPITAL_COMMUNITY): Payer: Self-pay

## 2023-04-27 DIAGNOSIS — R3914 Feeling of incomplete bladder emptying: Secondary | ICD-10-CM | POA: Diagnosis not present

## 2023-04-27 DIAGNOSIS — R350 Frequency of micturition: Secondary | ICD-10-CM | POA: Diagnosis not present

## 2023-05-25 ENCOUNTER — Ambulatory Visit: Payer: Medicare Other | Admitting: Family Medicine

## 2023-05-25 ENCOUNTER — Encounter: Payer: Self-pay | Admitting: Family Medicine

## 2023-05-25 VITALS — BP 124/76 | HR 75 | Temp 97.8°F | Ht 69.0 in | Wt 194.8 lb

## 2023-05-25 DIAGNOSIS — R42 Dizziness and giddiness: Secondary | ICD-10-CM

## 2023-05-25 NOTE — Progress Notes (Signed)
Subjective:    Patient ID: Theodore Davis, male    DOB: Nov 14, 1945, 77 y.o.   MRN: [id number]  Patient reports episode lightheadedness.  These are not related to movement or position changes.  There is no vertigo.  The patient states that he will like he is lightheaded and may pass out.  These occur spontaneously without warning.  They last several seconds and then slowly resolve.  They do not appear to be positional.  He denies any tachycardia or irregular heartbeats.  He denies any chest pain or shortness of breath or pleurisy.  He denies any seizure activity.  He denies any vertigo.  Patient states he just feels lightheaded.  Usually occurs when he stands up.  Patient sounds like he may be experiencing orthostatic dizziness.  I saw the patient for similar symptoms in September had him temporarily discontinue Rapaflo and metoprolol and his symptoms went away.  He is no longer taking Rapaflo but he is still taking metoprolol. Past Medical History:  Diagnosis Date   Arthritis 10/03/2010   right sternoclavicular joint   Heart murmur    Hemangioma of spine    lower lumbar   HOH (hard of hearing)    R ear   Hyperlipidemia    hypercholesterolemia   Lyme disease    Past Surgical History:  Procedure Laterality Date   BIOPSY  08/28/2021   Procedure: BIOPSY;  Surgeon: Dolores Frame, MD;  Location: [facility];  Service: Gastroenterology;;   CARDIOVASCULAR STRESS TEST  08/2010   CHOLECYSTECTOMY     COLONOSCOPY WITH PROPOFOL N/A 08/28/2021   Procedure: COLONOSCOPY WITH PROPOFOL;  Surgeon: Dolores Frame, MD;  Location: [facility];  Service: Gastroenterology;  Laterality: N/A;  1045  ASA 1   ENDOSCOPIC MUCOSAL RESECTION  09/18/2021   Procedure: ENDOSCOPIC MUCOSAL RESECTION;  Surgeon: Meridee Score Netty Starring., MD;  Location: [facility];  Service: Gastroenterology;;   EUS N/A 09/18/2021   Procedure: LOWER ENDOSCOPIC ULTRASOUND (EUS);  Surgeon: Lemar Lofty., MD;   Location: [facility];  Service: Gastroenterology;  Laterality: N/A;   FLEXIBLE SIGMOIDOSCOPY N/A 09/18/2021   Procedure: FLEXIBLE SIGMOIDOSCOPY;  Surgeon: Meridee Score Netty Starring., MD;  Location: [facility];  Service: Gastroenterology;  Laterality: N/A;   HEMOSTASIS CLIP PLACEMENT  09/18/2021   Procedure: HEMOSTASIS CLIP PLACEMENT;  Surgeon: Meridee Score Netty Starring., MD;  Location: [facility];  Service: Gastroenterology;;   HERNIA REPAIR     R inguinal   JOINT REPLACEMENT Right    MASTOID DEBRIDEMENT     R ear   POLYPECTOMY  08/28/2021   Procedure: POLYPECTOMY;  Surgeon: Dolores Frame, MD;  Location: [facility];  Service: Gastroenterology;;   POLYPECTOMY  09/18/2021   Procedure: POLYPECTOMY;  Surgeon: Lemar Lofty., MD;  Location: [facility];  Service: Gastroenterology;;   RESECTION DISTAL CLAVICAL Left 11/30/2014   Procedure: RESECTION DISTAL CLAVICAL;  Surgeon: Mckinley Jewel, MD;  Location: [facility];  Service: Orthopedics;  Laterality: Left;   SHOULDER ARTHROSCOPY WITH BICEPSTENOTOMY Left 11/30/2014   Procedure: SHOULDER ARTHROSCOPY WITH BICEPSTENOTOMY;  Surgeon: Mckinley Jewel, MD;  Location: [facility];  Service: Orthopedics;  Laterality: Left;   SHOULDER ARTHROSCOPY WITH ROTATOR CUFF REPAIR AND SUBACROMIAL DECOMPRESSION Left 11/30/2014   Procedure: LEFT SHOULDER ARTHROSCOPY WITH DISTAL CLAVICAL EXCISION, ACROMIOPLASTY, ROTATOR CUFF REPAIR , BICEPS TENOTOMY;  Surgeon: Mckinley Jewel, MD;  Location: [facility];  Service: Orthopedics;  Laterality: Left;   SUBMUCOSAL LIFTING INJECTION  09/18/2021   Procedure:  SUBMUCOSAL LIFTING INJECTION;  Surgeon: Meridee Score Netty Starring., MD;  Location: [facility];  Service: Gastroenterology;;   TENDON REPAIR  2005   R elbow   TOTAL KNEE ARTHROPLASTY  05/28/2011   Procedure: TOTAL KNEE ARTHROPLASTY;  Surgeon: Loreta Ave, MD;  Location: [facility];  Service: Orthopedics;  Laterality: Right;   Right total knee arthroplasty    US ECHOCARDIOGRAPHY  08/2010   Current Outpatient Medications on File Prior to Visit  Medication Sig Dispense Refill   HYDROcodone bit-homatropine (HYCODAN) 5-1.5 MG/5ML syrup Take 5 mLs by mouth every 8 (eight) hours as needed for cough. 120 mL 0   Magnesium Gluconate 550 MG TABS Take 225 mg by mouth daily.     Magnesium Oxide 200 MG TABS Take 400 mg by mouth at bedtime.     metoprolol succinate (TOPROL XL) 25 MG 24 hr tablet Take 0.5 tablets (12.5 mg total) by mouth daily. 45 tablet 3   silodosin (RAPAFLO) 8 MG CAPS capsule Take 1 capsule (8 mg total) by mouth every other day 45 capsule 3   simvastatin (ZOCOR) 40 MG tablet TAKE 1 TABLET (40 MG TOTAL) BY MOUTH DAILY AT 6 PM. NEED OV. 90 tablet 1   [DISCONTINUED] promethazine (PHENERGAN) 25 MG tablet Take 1 tablet (25 mg total) by mouth every 8 (eight) hours as needed for nausea or vomiting. 20 tablet 0   No current facility-administered medications on file prior to visit.   Allergies  Allergen Reactions   Codeine Nausea And Vomiting   Lipitor [Atorvastatin Calcium] Other (See Comments)    myalgias   Social History   Socioeconomic History   Marital status: Married    Spouse name: Not on file   Number of children: 2   Years of education: Not on file   Highest education level: Not on file  Occupational History   Not on file  Tobacco Use   Smoking status: Former    Current packs/day: 0.00    Types: Cigarettes    Quit date: 06/23/1997    Years since quitting: 25.9   Smokeless tobacco: Former  Building services engineer status: Never Used  Substance and Sexual Activity   Alcohol use: Yes    Comment: occasional beer   Drug use: No   Sexual activity: Yes  Other Topics Concern   Not on file  Social History Narrative   [facility] (04/08/17):   Originally from [place] [facility] [place]. Has lived in [place] as well. Previously worked for the [employer] as a Curator. Has 2 dogs & a rabbit currently. No bird exposure. He  reports black mold was found in his air conditioner at [facility] when it was changed out 2 years ago. No hot tub exposure. Enjoys fishing.    Married since 1974.    2 sons   3 grandchildren.   Social Determinants of Health   Financial Resource Strain: Low Risk  (06/24/2022)   Overall Financial Resource Strain (CARDIA)    Difficulty of Paying Living Expenses: Not hard at all  Food Insecurity: No Food Insecurity (06/24/2022)   Hunger Vital Sign    Worried About Running Out of Food in the Last Year: Never true    Ran Out of Food in the Last Year: Never true  Transportation Needs: No Transportation Needs (06/24/2022)   PRAPARE - Administrator, Civil Service (Medical): No    Lack of Transportation (Non-Medical): No  Physical Activity: Unknown (06/24/2022)   Exercise Vital Sign  Days of Exercise per Week: 5 days    Minutes of Exercise per Session: Not on file  Stress: No Stress Concern Present (06/24/2022)   [employer] of Occupational Health - Occupational Stress Questionnaire    Feeling of Stress : Not at all  Social Connections: Socially Integrated (06/24/2022)   Social Connection and Isolation Panel [NHANES]    Frequency of Communication with Friends and Family: More than three times a week    Frequency of Social Gatherings with Friends and Family: Three times a week    Attends Religious Services: More than 4 times per year    Active Member of Clubs or Organizations: Yes    Attends Banker Meetings: More than 4 times per year    Marital Status: Married  Catering manager Violence: Not At Risk (06/24/2022)   Humiliation, Afraid, Rape, and Kick questionnaire    Fear of Current or Ex-Partner: No    Emotionally Abused: No    Physically Abused: No    Sexually Abused: No   Family History  Problem Relation Age of Onset   Heart disease Mother    Atrial fibrillation Mother    Heart disease Father    Rheum arthritis Father    Bladder Cancer Father    Atrial  fibrillation Father    Brain cancer Maternal Uncle    Lung disease Neg Hx      Review of Systems  All other systems reviewed and are negative.      Objective:   Physical Exam Vitals and nursing note reviewed.  Constitutional:      General: He is not in acute distress.    Appearance: He is well-developed. He is not diaphoretic.  Cardiovascular:     Rate and Rhythm: Regular rhythm. Bradycardia present.     Heart sounds: Normal heart sounds.  Pulmonary:     Effort: Pulmonary effort is normal. No respiratory distress.     Breath sounds: Normal breath sounds. No wheezing.  Musculoskeletal:     Right lower leg: No edema.     Left lower leg: No edema.  Neurological:     General: No focal deficit present.     Mental Status: He is oriented to person, place, and time. Mental status is at baseline.     Cranial Nerves: No cranial nerve deficit.     Sensory: No sensory deficit.     Motor: No weakness.     Coordination: Coordination normal.     Gait: Gait normal.     Deep Tendon Reflexes: Reflexes normal.         Assessment & Plan:  Episodic lightheadedness I believe the patient is dealing with episodic hypotension.  I recommended that he discontinue metoprolol and then reassess in 1 week.  Also recommended that he drink more fluids.  Symptoms do not sound like vertigo.  They also did not sound like cardiac arrhythmia.  No evidence of a sinus infection on today's exam.  Specifically the patient denies any headache, sinus pain, sinus pressure, rhinorrhea, postnasal drip

## 2023-06-28 ENCOUNTER — Inpatient Hospital Stay
Admit: 2023-06-28 | Discharge: 2023-06-28 | Disposition: A | Discharge status: Home / Self Care | Payer: MEDICARE | Admitting: Family

## 2023-06-28 ENCOUNTER — Emergency Department: Admit: 2023-06-28 | Payer: MEDICARE | Primary: Internal Medicine

## 2023-06-28 DIAGNOSIS — J40 Bronchitis, not specified as acute or chronic: Secondary | ICD-10-CM

## 2023-06-28 LAB — COVID-19 & INFLUENZA COMBO
Influenza A: NOT DETECTED
Influenza B: NOT DETECTED
SARS-CoV-2 RNA, RT PCR: NOT DETECTED

## 2023-06-28 MED ORDER — DM-GUAIFENESIN ER 60-1200 MG PO TB12
60-1200 | ORAL_TABLET | Freq: Two times a day (BID) | ORAL | 0 refills | Status: AC | PRN
Start: 2023-06-28 — End: 2023-07-05

## 2023-06-28 MED ORDER — METHYLPREDNISOLONE 4 MG PO TBPK
4 | PACK | ORAL | 0 refills | Status: AC
Start: 2023-06-28 — End: 2023-07-04

## 2023-06-28 MED ORDER — AZITHROMYCIN 250 MG PO TABS
250 | PACK | ORAL | 0 refills | Status: AC
Start: 2023-06-28 — End: 2023-07-02

## 2023-06-28 NOTE — ED Provider Notes (Signed)
 Department of Emergency Medicine   ED  Encounter Note  Admit Date/RoomTime: 06/28/2023  2:21 PM  ED Room: 02/02    NAME: Troy Mcdaniel  DOB: [birth year]  MRN: [id number]     Chief Complaint:  Chest Congestion and Cough    History of Present Illness       Troy Mcdaniel is a 78 y.o. old male who presents to urgent care by private vehicle, for cough, which began 1 week(s) prior to arrival.  Since onset the symptoms have been stable and mild-moderate in severity. The symptoms are associated with chest congestion, wheezing.  There has been no chest pain, dyspnea, hemoptysis.  History of asthma.    ROS   Pertinent positives and negatives are stated within HPI, all other systems reviewed and are negative.    Past Medical History:  has a past medical history of Asthma, Cancer (HCC), Hyperlipidemia, and Hypertension.    Surgical History:  has a past surgical history that includes Prostatectomy; Colonoscopy; Cataract extraction (Right); Pectoralis major repair; and Eye surgery (Left, 11/12/2021).    Social History:  reports that he has quit smoking. He has never used smokeless tobacco. He reports current alcohol use of about 1.0 standard drink of alcohol per week. He reports that he does not use drugs.    Family History: family history includes Cancer in his father; Dementia in his mother.     Allergies: Patient has no known allergies.    Physical Exam   Oxygen Saturation Interpretation: Normal on room air analysis.        ED Triage Vitals   BP Systolic BP Percentile Diastolic BP Percentile Temp Temp src Pulse Respirations SpO2   06/28/23 1422 -- -- 06/28/23 1422 -- 06/28/23 1422 06/28/23 1518 06/28/23 1422   (!) 144/80   97.7 F (36.5 C)  58 16 99 %      Height Weight - Scale         06/28/23 1421 06/28/23 1421         1.88 m (6' 2) 97.5 kg (215 lb)               Constitutional:  Alert, development consistent with age.  Ears:  External Ears: Bilateral normal.               TM's & External Canals: normal TM's and external  ear canals bilaterally.  Nose:   There is clear rhinorrhea and mucosal edema.  Sinuses: no Bilateral maxillary sinus tenderness.                    no Bilateral frontal sinus tenderness.  Mouth:  normal tongue and buccal mucosa.   Throat: no erythema or exudates noted. Teeth and gums normal.  Airway Patent.  Neck:  Supple. No meningeal signs. There is no  preauricular, anterior cervical, and posterior cervical node tenderness.  Respiratory:   Breath sounds: Bilateral normal.  Lung sounds: normal.   CV:  Regular rate and rhythm, normal heart sounds, without pathological murmurs, ectopy, gallops, or rubs.  GI:  Abdomen Soft, nontender, good bowel sounds.  No firm or pulsatile mass.  Integument:  Normal turgor.  Warm, dry, without visible rash.  Neurological:  Oriented.  Motor functions intact.       Lab / Imaging Results   (All laboratory and radiology results have been personally reviewed by myself)  Labs:  Results for orders placed or performed during the hospital encounter of 06/28/23   COVID-19 & Influenza Combo  Specimen: Nasopharyngeal Swab   Result Value Ref Range    Specimen Description .NASOPHARYNGEAL SWAB     Source .NASOPHARYNGEAL SWAB     SARS-CoV-2 RNA, RT PCR Not Detected Not Detected    Influenza A Not Detected Not Detected    Influenza B Not Detected Not Detected       Imaging:  All Radiology results interpreted by Radiologist unless otherwise noted.  XR CHEST (2 VW)   Final Result      Minimal left basilar lung scarring but no acute cardiopulmonary disease is   noted.             ED Course / Medical Decision Making   Medications - No data to display         Medical Decision Making:   Abdoulie Tierce presented to [facility] with complaints of Chest Congestion and Cough    With  suspicion for pneumonia as per history/physical findings, imaging was done.  CXR negative.    With moderate suspicion for COVID-19/influenza, testing was done and were negative.     Based on history and examination findings of Deontaye Civello, the causative nature of illness may be Bacterial or Viral in etiology however patient does have a history of asthma and frequent episodes of bronchitis. Therefore, antibiotics and symptomatic control with supportive meds is considered appropriate at this time. He is not hypoxic.  Patient is well appearing, non toxic, meets criteria for and is appropriate for outpatient management.    Normal progression of disease discussed.  Instruction provided in the use of fluids, vaporizer, acetaminophen , and other OTC medication for symptom control.  Extra fluids  Analgesics as needed, dosages and times reviewed.  Follow up as needed should symptoms fail to improve.    Assessment     1. Bronchitis      Plan   Discharged home.  Patient condition is good    New Medications     Discharge Medication List as of 06/28/2023  3:14 PM        START taking these medications    Details   azithromycin  (ZITHROMAX  Z-PAK) 250 MG tablet Take 2 tablets (500 mg) on Day 1, and then take 1 tablet (250 mg) on days 2 through 5., Disp-1 packet, R-0Normal      methylPREDNISolone  (MEDROL , PAK,) 4 MG tablet TAKE AS DIRECTED, Disp-1 kit, R-0Normal      Dextromethorphan-guaiFENesin  60-1200 MG TB12 Take 1 tablet by mouth every 12 hours as needed (COUGH), Disp-14 tablet, R-0Normal           Electronically signed by Trenda Sharps, APRN - CNP   DD: 06/28/23  **This report was transcribed using voice recognition software. Every effort was made to ensure accuracy; however, inadvertent computerized transcription errors may be present.  END OF ED PROVIDER NOTE         Quadry Kampa Rebecca, APRN - CNP  06/28/23 [phone]

## 2023-07-02 ENCOUNTER — Ambulatory Visit: Payer: Medicare Other | Admitting: *Deleted

## 2023-07-02 DIAGNOSIS — Z0000 Encounter for general adult medical examination without abnormal findings: Secondary | ICD-10-CM

## 2023-07-02 NOTE — Progress Notes (Signed)
 Subjective:   Theodore Davis is a 78 y.o. male who presents for Medicare Annual/Subsequent preventive examination.  Visit Complete: Virtual I connected with  Vinie MALVA Dawn on 07/02/23 by a audio enabled telemedicine application and verified that I am speaking with the correct person using two identifiers.  Patient Location: Home  Provider Location: Home Office  I discussed the limitations of evaluation and management by telemedicine. The patient expressed understanding and agreed to proceed.  Vital Signs: Because this visit was a virtual/telehealth visit, some criteria may be missing or patient reported. Any vitals not documented were not able to be obtained and vitals that have been documented are patient reported.  Unable to connect video  Cardiac Risk Factors include: advanced age (>22men, >67 women);male gender;hypertension;family history of premature cardiovascular disease     Objective:    There were no vitals filed for this visit. There is no height or weight on file to calculate BMI.     07/02/2023   12:27 PM 06/24/2022   12:56 PM 09/18/2021    7:57 AM 08/28/2021    9:07 AM 08/22/2021    1:56 PM 07/18/2021    2:33 PM 05/31/2021    9:56 AM  Advanced Directives  Does Patient Have a Medical Advance Directive? Yes Yes Yes No No No Yes  Type of Diplomatic Services Operational Officer Living will;Healthcare Power of Attorney Living will    Healthcare Power of [place];Living will  Does patient want to make changes to medical advance directive?  No - Patient declined       Copy of Healthcare Power of Attorney in Chart? No - copy requested No - copy requested     No - copy requested  Would patient like information on creating a medical advance directive?    No - Patient declined No - Patient declined No - Patient declined     Current Medications (verified) Outpatient Encounter Medications as of 07/02/2023  Medication Sig   Magnesium Gluconate 550 MG TABS Take 225 mg by  mouth daily.   Magnesium Oxide 200 MG TABS Take 400 mg by mouth at bedtime.   metoprolol  succinate (TOPROL  XL) 25 MG 24 hr tablet Take 0.5 tablets (12.5 mg total) by mouth daily.   simvastatin  (ZOCOR ) 40 MG tablet TAKE 1 TABLET (40 MG TOTAL) BY MOUTH DAILY AT 6 PM. NEED OV.   HYDROcodone  bit-homatropine (HYCODAN) 5-1.5 MG/5ML syrup Take 5 mLs by mouth every 8 (eight) hours as needed for cough.   silodosin  (RAPAFLO ) 8 MG CAPS capsule Take 1 capsule (8 mg total) by mouth every other day   [DISCONTINUED] promethazine  (PHENERGAN ) 25 MG tablet Take 1 tablet (25 mg total) by mouth every 8 (eight) hours as needed for nausea or vomiting.   No facility-administered encounter medications on file as of 07/02/2023.    Allergies (verified) Codeine  and Lipitor [atorvastatin calcium]   History: Past Medical History:  Diagnosis Date   Arthritis 10/03/2010   right sternoclavicular joint   Heart murmur    Hemangioma of spine    lower lumbar   HOH (hard of hearing)    R ear   Hyperlipidemia    hypercholesterolemia   Lyme disease    Past Surgical History:  Procedure Laterality Date   BIOPSY  08/28/2021   Procedure: BIOPSY;  Surgeon: Eartha Angelia Sieving, MD;  Location: [facility];  Service: Gastroenterology;;   CARDIOVASCULAR STRESS TEST  08/2010   CHOLECYSTECTOMY     COLONOSCOPY WITH PROPOFOL  N/A  08/28/2021   Procedure: COLONOSCOPY WITH PROPOFOL ;  Surgeon: Eartha Angelia Sieving, MD;  Location: [facility];  Service: Gastroenterology;  Laterality: N/A;  1045  ASA 1   ENDOSCOPIC MUCOSAL RESECTION  09/18/2021   Procedure: ENDOSCOPIC MUCOSAL RESECTION;  Surgeon: Wilhelmenia Aloha Raddle., MD;  Location: [facility];  Service: Gastroenterology;;   EUS N/A 09/18/2021   Procedure: LOWER ENDOSCOPIC ULTRASOUND (EUS);  Surgeon: Wilhelmenia Aloha Raddle., MD;  Location: [facility];  Service: Gastroenterology;  Laterality: N/A;   FLEXIBLE SIGMOIDOSCOPY N/A 09/18/2021   Procedure: FLEXIBLE SIGMOIDOSCOPY;   Surgeon: Wilhelmenia Aloha Raddle., MD;  Location: [facility];  Service: Gastroenterology;  Laterality: N/A;   HEMOSTASIS CLIP PLACEMENT  09/18/2021   Procedure: HEMOSTASIS CLIP PLACEMENT;  Surgeon: Wilhelmenia Aloha Raddle., MD;  Location: [facility];  Service: Gastroenterology;;   HERNIA REPAIR     R inguinal   JOINT REPLACEMENT Right    MASTOID DEBRIDEMENT     R ear   POLYPECTOMY  08/28/2021   Procedure: POLYPECTOMY;  Surgeon: Eartha Angelia Sieving, MD;  [facility];  Service: Gastroenterology;;   POLYPECTOMY  09/18/2021   Procedure: POLYPECTOMY;  Surgeon: Wilhelmenia Aloha Raddle., MD;  Location: [facility];  Service: Gastroenterology;;   RESECTION DISTAL CLAVICAL Left 11/30/2014   Procedure: RESECTION DISTAL CLAVICAL;  Surgeon: Sieving Chancy, MD;  Location: [facility];  Service: Orthopedics;  Laterality: Left;   SHOULDER ARTHROSCOPY WITH BICEPSTENOTOMY Left 11/30/2014   Procedure: SHOULDER ARTHROSCOPY WITH BICEPSTENOTOMY;  Surgeon: Sieving Chancy, MD;  Location: [facility];  Service: Orthopedics;  Laterality: Left;   SHOULDER ARTHROSCOPY WITH ROTATOR CUFF REPAIR AND SUBACROMIAL DECOMPRESSION Left 11/30/2014   Procedure: LEFT SHOULDER ARTHROSCOPY WITH DISTAL CLAVICAL EXCISION, ACROMIOPLASTY, ROTATOR CUFF REPAIR , BICEPS TENOTOMY;  Surgeon: Sieving Chancy, MD;  Location: [facility];  Service: Orthopedics;  Laterality: Left;   SUBMUCOSAL LIFTING INJECTION  09/18/2021   Procedure: SUBMUCOSAL LIFTING INJECTION;  Surgeon: Wilhelmenia Aloha Raddle., MD;  Location: [facility];  Service: Gastroenterology;;   TENDON REPAIR  2005   R elbow   TOTAL KNEE ARTHROPLASTY  05/28/2011   Procedure: TOTAL KNEE ARTHROPLASTY;  Surgeon: Sieving JULIANNA Chancy, MD;  Location: [facility];  Service: Orthopedics;  Laterality: Right;  Right total knee arthroplasty    US  ECHOCARDIOGRAPHY  08/2010   Family History  Problem Relation Age of Onset   Heart disease Mother    Atrial  fibrillation Mother    Heart disease Father    Rheum arthritis Father    Bladder Cancer Father    Atrial fibrillation Father    Brain cancer Maternal Uncle    Lung disease Neg Hx    Social History   Socioeconomic History   Marital status: Married    Spouse name: Not on file   Number of children: 2   Years of education: Not on file   Highest education level: Not on file  Occupational History   Not on file  Tobacco Use   Smoking status: Former    Current packs/day: 0.00    Types: Cigarettes    Quit date: 06/23/1997    Years since quitting: 26.0   Smokeless tobacco: Former  Building Services Engineer status: Never Used  Substance and Sexual Activity   Alcohol use: Yes    Comment: occasional beer   Drug use: No   Sexual activity: Yes  Other Topics Concern   Not on file  Social History Narrative   [facility] (04/08/17):   Originally from  [place]. Has lived in [place] as well. Previously worked for the designer, multimedia as a curator. Has 2 dogs & a rabbit currently. No bird exposure. He reports black mold was found in his air conditioner at [facility] when it was changed out 2 years ago. No hot tub exposure. Enjoys fishing.    Married since 1974.    2 sons   3 grandchildren.   Social Drivers of Corporate Investment Banker Strain: Low Risk  (07/02/2023)   Overall Financial Resource Strain (CARDIA)    Difficulty of Paying Living Expenses: Not hard at all  Food Insecurity: No Food Insecurity (07/02/2023)   Hunger Vital Sign    Worried About Running Out of Food in the Last Year: Never true    Ran Out of Food in the Last Year: Never true  Transportation Needs: No Transportation Needs (07/02/2023)   PRAPARE - Administrator, Civil Service (Medical): No    Lack of Transportation (Non-Medical): No  Physical Activity: Insufficiently Active (07/02/2023)   Exercise Vital Sign    Days of Exercise per Week: 3 days    Minutes of Exercise per Session: 40 min  Stress: No Stress Concern  Present (07/02/2023)   [employer] of Occupational Health - Occupational Stress Questionnaire    Feeling of Stress : Not at all  Social Connections: Socially Integrated (07/02/2023)   Social Connection and Isolation Panel [NHANES]    Frequency of Communication with Friends and Family: More than three times a week    Frequency of Social Gatherings with Friends and Family: More than three times a week    Attends Religious Services: More than 4 times per year    Active Member of [employer] or Organizations: Yes    Attends Engineer, Structural: More than 4 times per year    Marital Status: Married    Tobacco Counseling Counseling given: Not Answered   Clinical Intake:  Pre-visit preparation completed: Yes  Pain : No/denies pain     Diabetes: No  How often do you need to have someone help you when you read instructions, pamphlets, or other written materials from your doctor or pharmacy?: 1 - Never  Interpreter Needed?: No  Information entered by :: Mliss Graff LPN   Activities of Daily Living    07/02/2023   12:30 PM  In your present state of health, do you have any difficulty performing the following activities:  Hearing? 1  Vision? 0  Difficulty concentrating or making decisions? 0  Walking or climbing stairs? 0  Dressing or bathing? 0  Doing errands, shopping? 0  Preparing Food and eating ? N  Using the Toilet? N  In the past six months, have you accidently leaked urine? N  Do you have problems with loss of bowel control? N  Managing your Medications? N  Managing your Finances? N  Housekeeping or managing your Housekeeping? N    Patient Care Team: Duanne Butler DASEN, MD as PCP - General (Family Medicine) Burnard Debby LABOR, MD as PCP - Cardiology (Cardiology) Duanne Butler DASEN, MD (Family Medicine) Alvaro Ricardo KATHEE Raddle., MD as Consulting Physician (Urology)  Indicate any recent Medical Services you may have received from other than [facility] providers in the past  year (date may be approximate).     Assessment:   This is a routine wellness examination for Theodore Davis.  Hearing/Vision screen Hearing Screening - Comments:: Hearing aids bilateral  Vision Screening - Comments:: Up to date Looking for a new  one   Goals Addressed             This Visit's Progress    Patient Stated       Maintain current lifestyle       Depression Screen    07/02/2023   12:32 PM 08/19/2022    9:51 AM 06/24/2022   12:55 PM 05/31/2021    9:50 AM 07/24/2020   11:29 AM 05/30/2020   12:14 PM 09/21/2019   10:04 AM  PHQ 2/9 Scores  PHQ - 2 Score 0 0 0 0 0 0 0  PHQ- 9 Score 0      0    Fall Risk    07/02/2023   12:27 PM 08/19/2022    9:51 AM 06/24/2022   12:52 PM 05/31/2021    9:57 AM 07/24/2020   11:29 AM  Fall Risk   Falls in the past year? 0 0 0 0 0  Number falls in past yr: 0 0 0 0 0  Injury with Fall? 0 0 0 0 0  Risk for fall due to :  No Fall Risks No Fall Risks No Fall Risks   Follow up Falls evaluation completed;Education provided;Falls prevention discussed Falls prevention discussed Falls evaluation completed;Education provided;Falls prevention discussed Falls prevention discussed Falls evaluation completed    MEDICARE RISK AT HOME: Medicare Risk at Home Any stairs in or around the home?: Yes If so, are there any without handrails?: No Home free of loose throw rugs in walkways, pet beds, electrical cords, etc?: Yes Adequate lighting in your home to reduce risk of falls?: Yes Life alert?: No Use of a cane, walker or w/c?: No Grab bars in the bathroom?: No Shower chair or bench in shower?: Yes Elevated toilet seat or a handicapped toilet?: No  TIMED UP AND GO:  Was the test performed?  No    Cognitive Function:        07/02/2023   12:33 PM 06/24/2022   12:57 PM 05/31/2021   10:01 AM  6CIT Screen  What Year? 0 points 0 points 0 points  What month? 0 points 0 points 0 points  What time? 0 points 0 points 0 points  Count back from 20 0 points 0  points 0 points  Months in reverse 0 points 0 points 0 points  Repeat phrase 0 points 2 points 2 points  Total Score 0 points 2 points 2 points    Immunizations Immunization History  Administered Date(s) Administered   Influenza, High Dose Seasonal PF 04/04/2017   Influenza-Unspecified 04/07/2022   PFIZER(Purple Top)SARS-COV-2 Vaccination 08/14/2019, 09/06/2019, 04/25/2020   Pneumococcal Conjugate-13 10/18/2013   Pneumococcal Polysaccharide-23 10/24/2014   Td 10/17/2010   Tdap 10/17/2010    TDAP status: Due, Education has been provided regarding the importance of this vaccine. Advised may receive this vaccine at local pharmacy or Health Dept. Aware to provide a copy of the vaccination record if obtained from local pharmacy or Health Dept. Verbalized acceptance and understanding.  Flu Vaccine status: Due, Education has been provided regarding the importance of this vaccine. Advised may receive this vaccine at local pharmacy or Health Dept. Aware to provide a copy of the vaccination record if obtained from local pharmacy or Health Dept. Verbalized acceptance and understanding.  Pneumococcal vaccine status: Up to date  Covid-19 vaccine status: Information provided on how to obtain vaccines.   Qualifies for Shingles Vaccine? Yes   Zostavax completed No   Shingrix Completed?: No.    Education has been provided regarding  the importance of this vaccine. Patient has been advised to call insurance company to determine out of pocket expense if they have not yet received this vaccine. Advised may also receive vaccine at local pharmacy or Health Dept. Verbalized acceptance and understanding.  Screening Tests Health Maintenance  Topic Date Due   COVID-19 Vaccine (4 - 2024-25 season) 07/18/2023 (Originally 02/22/2023)   INFLUENZA VACCINE  09/21/2023 (Originally 01/22/2023)   DTaP/Tdap/Td (3 - Td or Tdap) 07/01/2024 (Originally 10/16/2020)   Zoster Vaccines- Shingrix (1 of 2) 07/06/2024 (Originally  04/14/1998)   Medicare Annual Wellness (AWV)  07/01/2024   Colonoscopy  08/29/2026   Pneumonia Vaccine 17+ Years old  Completed   Hepatitis C Screening  Completed   HPV VACCINES  Aged Out    Health Maintenance  There are no preventive care reminders to display for this patient.   Colorectal cancer screening: No longer required.   Lung Cancer Screening: (Low Dose CT Chest recommended if Age 34-80 years, 20 pack-year currently smoking OR have quit w/in 15years.) does not qualify.   Lung Cancer Screening Referral:   Additional Screening:  Hepatitis C Screening: does not qualify; Completed 2019  Vision Screening: Recommended annual ophthalmology exams for early detection of glaucoma and other disorders of the eye. Is the patient up to date with their annual eye exam?  Yes  Who is the provider or what is the name of the office in which the patient attends annual eye exams? Education provided on New MD If pt is not established with a provider, would they like to be referred to a provider to establish care? No .   Dental Screening: Recommended annual dental exams for proper oral hygiene    Community Resource Referral / Chronic Care Management: CRR required this visit?  No   CCM required this visit?  No     Plan:     I have personally reviewed and noted the following in the patient's chart:   Medical and social history Use of alcohol, tobacco or illicit drugs  Current medications and supplements including opioid prescriptions. Patient is not currently taking opioid prescriptions. Functional ability and status Nutritional status Physical activity Advanced directives List of other physicians Hospitalizations, surgeries, and ER visits in previous 12 months Vitals Screenings to include cognitive, depression, and falls Referrals and appointments  In addition, I have reviewed and discussed with patient certain preventive protocols, quality metrics, and best practice  recommendations. A written personalized care plan for preventive services as well as general preventive health recommendations were provided to patient.     Mliss Graff, LPN   8/[id number]/[id number]   After Visit Summary: (MyChart) Due to this being a telephonic visit, the after visit summary with patients personalized plan was offered to patient via MyChart   Nurse Notes:

## 2023-07-02 NOTE — Patient Instructions (Signed)
 Theodore Davis , Thank you for taking time to come for your Medicare Wellness Visit. I appreciate your ongoing commitment to your health goals. Please review the following plan we discussed and let me know if I can assist you in the future.   Screening recommendations/referrals: Colonoscopy: no longer required Recommended yearly ophthalmology/optometry visit for glaucoma screening and checkup Recommended yearly dental visit for hygiene and checkup  Vaccinations: Influenza vaccine: Education provided Pneumococcal vaccine: up to date Tdap vaccine: Education provided   Preventive Care 65 Years and Older, Male Preventive care refers to lifestyle choices and visits with your health care provider that can promote health and wellness. What does preventive care include? A yearly physical exam. This is also called an annual well check. Dental exams once or twice a year. Routine eye exams. Ask your health care provider how often you should have your eyes checked. Personal lifestyle choices, including: Daily care of your teeth and gums. Regular physical activity. Eating a healthy diet. Avoiding tobacco and drug use. Limiting alcohol use. Practicing safe sex. Taking low doses of aspirin every day. Taking vitamin and mineral supplements as recommended by your health care provider. What happens during an annual well check? The services and screenings done by your health care provider during your annual well check will depend on your age, overall health, lifestyle risk factors, and family history of disease. Counseling  Your health care provider may ask you questions about your: Alcohol use. Tobacco use. Drug use. Emotional well-being. Home and relationship well-being. Sexual activity. Eating habits. History of falls. Memory and ability to understand (cognition). Work and work astronomer. Screening  You may have the following tests or measurements: Height, weight, and BMI. Blood  pressure. Lipid and cholesterol levels. These may be checked every 5 years, or more frequently if you are over 40 years old. Skin check. Lung cancer screening. You may have this screening every year starting at age 77 if you have a 30-pack-year history of smoking and currently smoke or have quit within the past 15 years. Fecal occult blood test (FOBT) of the stool. You may have this test every year starting at age 32. Flexible sigmoidoscopy or colonoscopy. You may have a sigmoidoscopy every 5 years or a colonoscopy every 10 years starting at age 46. Prostate cancer screening. Recommendations will vary depending on your family history and other risks. Hepatitis C blood test. Hepatitis B blood test. Sexually transmitted disease (STD) testing. Diabetes screening. This is done by checking your blood sugar (glucose) after you have not eaten for a while (fasting). You may have this done every 1-3 years. Abdominal aortic aneurysm (AAA) screening. You may need this if you are a current or former smoker. Osteoporosis. You may be screened starting at age 48 if you are at high risk. Talk with your health care provider about your test results, treatment options, and if necessary, the need for more tests. Vaccines  Your health care provider may recommend certain vaccines, such as: Influenza vaccine. This is recommended every year. Tetanus, diphtheria, and acellular pertussis (Tdap, Td) vaccine. You may need a Td booster every 10 years. Zoster vaccine. You may need this after age 63. Pneumococcal 13-valent conjugate (PCV13) vaccine. One dose is recommended after age 20. Pneumococcal polysaccharide (PPSV23) vaccine. One dose is recommended after age 17. Talk to your health care provider about which screenings and vaccines you need and how often you need them. This information is not intended to replace advice given to you by your health care  provider. Make sure you discuss any questions you have with your  health care provider. Document Released: 07/06/2015 Document Revised: 02/27/2016 Document Reviewed: 04/10/2015 Elsevier Interactive Patient Education  2017 [employer].  Fall Prevention in the Home Falls can cause injuries. They can happen to people of all ages. There are many things you can do to make your home safe and to help prevent falls. What can I do on the outside of my home? Regularly fix the edges of walkways and driveways and fix any cracks. Remove anything that might make you trip as you walk through a door, such as a raised step or threshold. Trim any bushes or trees on the path to your home. Use bright outdoor lighting. Clear any walking paths of anything that might make someone trip, such as rocks or tools. Regularly check to see if handrails are loose or broken. Make sure that both sides of any steps have handrails. Any raised decks and porches should have guardrails on the edges. Have any leaves, snow, or ice cleared regularly. Use sand or salt on walking paths during winter. Clean up any spills in your garage right away. This includes oil or grease spills. What can I do in the bathroom? Use night lights. Install grab bars by the toilet and in the tub and shower. Do not use towel bars as grab bars. Use non-skid mats or decals in the tub or shower. If you need to sit down in the shower, use a plastic, non-slip stool. Keep the floor dry. Clean up any water that spills on the floor as soon as it happens. Remove soap buildup in the tub or shower regularly. Attach bath mats securely with double-sided non-slip rug tape. Do not have throw rugs and other things on the floor that can make you trip. What can I do in the bedroom? Use night lights. Make sure that you have a light by your bed that is easy to reach. Do not use any sheets or blankets that are too big for your bed. They should not hang down onto the floor. Have a firm chair that has side arms. You can use this for  support while you get dressed. Do not have throw rugs and other things on the floor that can make you trip. What can I do in the kitchen? Clean up any spills right away. Avoid walking on wet floors. Keep items that you use a lot in easy-to-reach places. If you need to reach something above you, use a strong step stool that has a grab bar. Keep electrical cords out of the way. Do not use floor polish or wax that makes floors slippery. If you must use wax, use non-skid floor wax. Do not have throw rugs and other things on the floor that can make you trip. What can I do with my stairs? Do not leave any items on the stairs. Make sure that there are handrails on both sides of the stairs and use them. Fix handrails that are broken or loose. Make sure that handrails are as long as the stairways. Check any carpeting to make sure that it is firmly attached to the stairs. Fix any carpet that is loose or worn. Avoid having throw rugs at the top or bottom of the stairs. If you do have throw rugs, attach them to the floor with carpet tape. Make sure that you have a light switch at the top of the stairs and the bottom of the stairs. If you do not have  them, ask someone to add them for you. What else can I do to help prevent falls? Wear shoes that: Do not have high heels. Have rubber bottoms. Are comfortable and fit you well. Are closed at the toe. Do not wear sandals. If you use a stepladder: Make sure that it is fully opened. Do not climb a closed stepladder. Make sure that both sides of the stepladder are locked into place. Ask someone to hold it for you, if possible. Clearly mark and make sure that you can see: Any grab bars or handrails. First and last steps. Where the edge of each step is. Use tools that help you move around (mobility aids) if they are needed. These include: Canes. Walkers. Scooters. Crutches. Turn on the lights when you go into a dark area. Replace any light bulbs as soon  as they burn out. Set up your furniture so you have a clear path. Avoid moving your furniture around. If any of your floors are uneven, fix them. If there are any pets around you, be aware of where they are. Review your medicines with your doctor. Some medicines can make you feel dizzy. This can increase your chance of falling. Ask your doctor what other things that you can do to help prevent falls. This information is not intended to replace advice given to you by your health care provider. Make sure you discuss any questions you have with your health care provider. Document Released: 04/05/2009 Document Revised: 11/15/2015 Document Reviewed: 07/14/2014 Elsevier Interactive Patient Education  2017 [employer].

## 2023-07-07 ENCOUNTER — Other Ambulatory Visit (HOSPITAL_COMMUNITY): Payer: Self-pay

## 2023-08-19 ENCOUNTER — Other Ambulatory Visit: Payer: Self-pay | Admitting: Cardiovascular Disease

## 2023-09-03 ENCOUNTER — Other Ambulatory Visit: Payer: Self-pay | Admitting: Cardiovascular Disease

## 2023-11-18 ENCOUNTER — Encounter

## 2023-11-28 ENCOUNTER — Other Ambulatory Visit: Payer: Self-pay | Admitting: Cardiovascular Disease

## 2023-11-30 DIAGNOSIS — H52203 Unspecified astigmatism, bilateral: Secondary | ICD-10-CM | POA: Diagnosis not present

## 2023-11-30 DIAGNOSIS — H5203 Hypermetropia, bilateral: Secondary | ICD-10-CM | POA: Diagnosis not present

## 2023-11-30 DIAGNOSIS — H2513 Age-related nuclear cataract, bilateral: Secondary | ICD-10-CM | POA: Diagnosis not present

## 2023-12-05 ENCOUNTER — Inpatient Hospital Stay: Admit: 2023-12-05 | Payer: Medicare (Managed Care) | Primary: Internal Medicine

## 2023-12-05 DIAGNOSIS — Z87891 Personal history of nicotine dependence: Secondary | ICD-10-CM

## 2023-12-22 ENCOUNTER — Inpatient Hospital Stay
Admit: 2023-12-22 | Discharge: 2023-12-22 | Disposition: A | Discharge status: Home / Self Care | Payer: Medicare (Managed Care) | Arrived: WI

## 2023-12-22 DIAGNOSIS — H6121 Impacted cerumen, right ear: Principal | ICD-10-CM

## 2023-12-22 NOTE — ED Notes (Signed)
 Flushed pts right ear at this time. White debre removed. Pt tolerated well.      Boyd Fleeting, LPN  [id number]

## 2023-12-22 NOTE — ED Provider Notes (Signed)
 [facility]     NAME: Troy Mcdaniel  DOB:  20-Oct-1945  MRN:  [id number]  Date of evaluation: 12/22/2023  Provider: Oneil DELENA Fend, PA-C  PCP: Aimee Harper I, MD  Note Started : 10:13 AM EDT 12/22/23    Chief Complaint: Ear Drainage (Bil ear discharge, Right ear is plugged up, Left ear had gray and black debre coming out of it with blood. )      This is a 78 year old male who presents urgent care stating that for the last 3 days he has been using hydrogen peroxide in both ears to irrigate his ears to remove earwax.  He has noticed some drainage coming out of both ears but mostly he still has some fullness with the right ear.  He did notice a little blood on a Q-tip this morning cleaning on his left ear.  He denies any new hearing loss he does wear hearing aids.  No lightheadedness or dizziness.  No chest pain or shortness of breath.  No abdominal pain nausea vomiting diarrhea urinary symptoms.  On first contact patient he appears to be in no acute distress.        Review of Systems  Pertinent positives and negatives are stated within HPI, all other systems reviewed and are negative.     Allergies: Patient has no known allergies.     --------------------------------------------- PAST HISTORY ---------------------------------------------  Past Medical History:  has a past medical history of Asthma, Cancer (HCC), Hyperlipidemia, and Hypertension.    Past Surgical History:  has a past surgical history that includes Prostatectomy; Colonoscopy; Cataract extraction (Right); Pectoralis major repair; and Eye surgery (Left, 11/12/2021).    Social History:  reports that he quit smoking about 5 years ago. His smoking use included cigarettes. He started smoking about 20 years ago. He has a 14.9 pack-year smoking history. He has never used smokeless tobacco. He reports current alcohol use of about 1.0 standard drink of alcohol per week. He reports that he does not use drugs.    Family History: family history  includes Cancer in his father; Dementia in his mother.     The patient's home medications have been reviewed.    The nursing notes within the ED encounter have been reviewed.     ------------------------------------------------SCREENINGS----------------------------------------------                        CIWA Assessment  BP: (!) 131/55  Pulse: 55           ---------------------------------------------PHYSICAL EXAM --------------------------------------------    Vitals:    12/22/23 0943 12/22/23 0945   BP:  (!) 131/55   Pulse:  55   Resp:  20   Temp:  98 F (36.7 C)   SpO2:  97%   Weight: 95.3 kg (210 lb)      Oxygen Saturation Interpretation: Normal     Physical Exam  Vitals and nursing note reviewed.   Constitutional:       General: He is not in acute distress.     Appearance: Normal appearance.   HENT:      Head: Normocephalic and atraumatic.      Jaw: There is normal jaw occlusion.      Right Ear: Hearing and external ear normal. No mastoid tenderness. No hemotympanum.      Left Ear: Hearing, tympanic membrane, ear canal and external ear normal. No mastoid tenderness. No hemotympanum.      Ears:  Comments: Left EAC appears to be normal.  There is no injury noted no bleeding.  TM is intact no wax noted.    Right EAC is partially obstructed with earwax.  There is no bleeding.  The visualized TM appears to be intact.     Nose: Nose normal.      Mouth/Throat:      Mouth: Mucous membranes are moist. No angioedema.      Pharynx: Oropharynx is clear. Uvula midline. No pharyngeal swelling or uvula swelling.      Comments: Oropharynx is patent.     Eyes:      Extraocular Movements: Extraocular movements intact.      Conjunctiva/sclera: Conjunctivae normal.      Pupils: Pupils are equal, round, and reactive to light.   Cardiovascular:      Rate and Rhythm: Normal rate and regular rhythm.      Pulses: Normal pulses.      Heart sounds: Normal heart sounds.   Pulmonary:      Effort: Pulmonary effort is normal.       Breath sounds: Normal breath sounds.   Musculoskeletal:         General: Normal range of motion.      Cervical back: Full passive range of motion without pain, normal range of motion and neck supple.   Lymphadenopathy:      Cervical: No cervical adenopathy.   Skin:     General: Skin is warm and dry.      Findings: No rash.   Neurological:      General: No focal deficit present.      Mental Status: He is alert and oriented to person, place, and time.   Psychiatric:         Behavior: Behavior is cooperative.         -------------------------------------------------- RESULTS -------------------------------------------------  No results found for this visit on 12/22/23.  No orders to display       Labs Reviewed - No data to display    When ordered only abnormal lab results are displayed. All other labs were within normal range or not returned as of this dictation.    Interpretation per the Radiologist below, if available at the time of this note:    No orders to display     No results found.    No results found.     -------------------------------------------------PROCEDURES--------------------------------------------  Unless otherwise noted below, none      Ear Wax Removal    Date/Time: 12/22/2023 10:13 AM    Performed by: Boyd Fleeting, LPN  Authorized by: Jessica Oneil LABOR, PA-C    Consent:     Consent obtained:  Verbal    Consent given by:  Patient    Risks, benefits, and alternatives were discussed: yes      Risks discussed:  Bleeding    Alternatives discussed:  No treatment  Universal protocol:     Procedure explained and questions answered to patient or proxy's satisfaction: yes      Patient identity confirmed:  Verbally with patient  Procedure details:     Location:  R ear    Procedure type: irrigation    Post-procedure details:     Inspection:  No bleeding and ear canal clear    Hearing quality:  Improved    Procedure completion:  Tolerated well, no immediate complications        ---EMERGENCY DEPARTMENT COURSE and  DIFFERENTIAL DIAGNOSIS/MDM---  (CC/HPI Summary, DDx, ED Course, and Reassessment:) (Disposition Considerations (include  1 Tests not done, Admit vs D/C, Shared Decision Making, Pt Expectation of Test or Tx., Consults, Social Determinants, Chronic Conditiions, Records reviewed)    MDM  Number of Diagnoses or Management Options  Impacted cerumen of right ear  Diagnosis management comments: This is a 78 year old male who presents urgent care stating that for the last 3 days he has been using hydrogen peroxide in both ears to irrigate his ears to remove earwax.  He has noticed some drainage coming out of both ears but mostly he still has some fullness with the right ear.  He did notice a little blood on a Q-tip this morning cleaning on his left ear.  He denies any new hearing loss he does wear hearing aids.  No lightheadedness or dizziness.  No chest pain or shortness of breath.  No abdominal pain nausea vomiting diarrhea urinary symptoms.  The irrigation to the right ear was successful and all the wax was removed.  Patient does feel better.  I did tell him that there was little bit of irritation to the ear canal tissues because of the irrigation.  But the TM is intact.  Instructions given.         DISCHARGE MEDICATIONS:  New Prescriptions    No medications on file       DISCONTINUED MEDICATIONS:  Discontinued Medications    No medications on file       PATIENT REFERRED TO:  Aimee Nile FERNS, MD  [address] [place]  [place] [id number]  [phone]    Schedule an appointment as soon as possible for a visit         --------------------------------- ADDITIONAL PROVIDER NOTES ---------------------------------  I have spoken with the patient and discussed today's results, in addition to providing specific details for the plan of care and counseling regarding the diagnosis and prognosis.  Their questions are answered at this time and they are agreeable with the plan.   This patient is stable for discharge.  I have shared the  specific conditions for return, as well as the importance of follow-up.      * NOTE: (Please note that portions of this note were completed with a voice recognition program.  Efforts were made to edit the dictations but occasionally words are mis-transcribed.)    --------------------------------- IMPRESSION AND DISPOSITION ---------------------------------    IMPRESSION  1. Impacted cerumen of right ear        Disposition: Discharge to home  Patient condition is good    I am the Primary Clinician of Record.     Oneil DELENA Fend, PA-C (electronically signed) on 12/22/2023 at 10:24 AM         Fend Oneil DELENA, PA-C  12/22/23 [id number]

## 2024-01-02 ENCOUNTER — Other Ambulatory Visit: Payer: Self-pay | Admitting: Cardiovascular Disease

## 2024-01-11 ENCOUNTER — Other Ambulatory Visit: Payer: Self-pay

## 2024-01-11 MED ORDER — SIMVASTATIN 40 MG PO TABS: 40.0000 mg | ORAL_TABLET | Freq: Every day | ORAL | 0 refills | Status: DC

## 2024-01-12 DIAGNOSIS — H43811 Vitreous degeneration, right eye: Secondary | ICD-10-CM | POA: Diagnosis not present

## 2024-01-21 ENCOUNTER — Encounter: Payer: Self-pay | Admitting: Family Medicine

## 2024-01-21 ENCOUNTER — Ambulatory Visit: Admitting: Family Medicine

## 2024-01-21 VITALS — BP 118/68 | HR 61 | Temp 98.0°F | Ht 69.0 in | Wt 192.0 lb

## 2024-01-21 DIAGNOSIS — J309 Allergic rhinitis, unspecified: Secondary | ICD-10-CM | POA: Diagnosis not present

## 2024-01-21 MED ORDER — PREDNISONE 20 MG PO TABS: ORAL_TABLET | ORAL | 0 refills | Status: DC

## 2024-01-21 NOTE — Progress Notes (Signed)
 Subjective:    Patient ID: Theodore Davis, male    DOB: September 16, 1945, 78 y.o.   MRN: [id number]  Patient states that he was fine on Tuesday.  He mowed his yard.  He was breathing in a lot of grass clippings and dust and debris.  Immediately on Wednesday he developed head congestion, sinus pressure, and feeling stuffed up.  He reports pain and pressure behind both ears.  He reports postnasal drip.  He reports cough productive of yellow sputum.  He denies any fever.  He denies any severe headache.  He denies any sinus pain however he has significant sinus pressure. Past Medical History:  Diagnosis Date   Arthritis 10/03/2010   right sternoclavicular joint   Heart murmur    Hemangioma of spine    lower lumbar   HOH (hard of hearing)    R ear   Hyperlipidemia    hypercholesterolemia   Lyme disease    Past Surgical History:  Procedure Laterality Date   BIOPSY  08/28/2021   Procedure: BIOPSY;  Surgeon: Eartha Angelia Sieving, MD;  [facility];  Service: Gastroenterology;;   CARDIOVASCULAR STRESS TEST  08/2010   CHOLECYSTECTOMY     COLONOSCOPY WITH PROPOFOL  N/A 08/28/2021   Procedure: COLONOSCOPY WITH PROPOFOL ;  Surgeon: Eartha Angelia Sieving, MD;  [facility];  Service: Gastroenterology;  Laterality: N/A;  1045  ASA 1   ENDOSCOPIC MUCOSAL RESECTION  09/18/2021   Procedure: ENDOSCOPIC MUCOSAL RESECTION;  Surgeon: Wilhelmenia Aloha Raddle., MD;  [facility];  Service: Gastroenterology;;   EUS N/A 09/18/2021   Procedure: LOWER ENDOSCOPIC ULTRASOUND (EUS);  Surgeon: Wilhelmenia Aloha Raddle., MD;  Location: [facility];  Service: Gastroenterology;  Laterality: N/A;   FLEXIBLE SIGMOIDOSCOPY N/A 09/18/2021   Procedure: FLEXIBLE SIGMOIDOSCOPY;  Surgeon: Wilhelmenia Aloha Raddle., MD;  Location: [facility];  Service: Gastroenterology;  Laterality: N/A;   HEMOSTASIS CLIP PLACEMENT  09/18/2021   Procedure: HEMOSTASIS CLIP PLACEMENT;  Surgeon: Wilhelmenia Aloha Raddle.,  MD;  Location: [facility];  Service: Gastroenterology;;   HERNIA REPAIR     R inguinal   JOINT REPLACEMENT Right    MASTOID DEBRIDEMENT     R ear   POLYPECTOMY  08/28/2021   Procedure: POLYPECTOMY;  Surgeon: Eartha Angelia Sieving, MD;  [facility];  Service: Gastroenterology;;   POLYPECTOMY  09/18/2021   Procedure: POLYPECTOMY;  Surgeon: Wilhelmenia Aloha Raddle., MD;  Location: [facility];  Service: Gastroenterology;;   RESECTION DISTAL CLAVICAL Left 11/30/2014   Procedure: RESECTION DISTAL CLAVICAL;  Surgeon: Sieving Chancy, MD;  Location: [facility];  Service: Orthopedics;  Laterality: Left;   SHOULDER ARTHROSCOPY WITH BICEPSTENOTOMY Left 11/30/2014   Procedure: SHOULDER ARTHROSCOPY WITH BICEPSTENOTOMY;  Surgeon: Sieving Chancy, MD;  Location: [facility];  Service: Orthopedics;  Laterality: Left;   SHOULDER ARTHROSCOPY WITH ROTATOR CUFF REPAIR AND SUBACROMIAL DECOMPRESSION Left 11/30/2014   Procedure: LEFT SHOULDER ARTHROSCOPY WITH DISTAL CLAVICAL EXCISION, ACROMIOPLASTY, ROTATOR CUFF REPAIR , BICEPS TENOTOMY;  Surgeon: Sieving Chancy, MD;  Location:  SURGERY CENTER;  Service: Orthopedics;  Laterality: Left;   SUBMUCOSAL LIFTING INJECTION  09/18/2021   Procedure: SUBMUCOSAL LIFTING INJECTION;  Surgeon: Wilhelmenia Aloha Raddle., MD;  Location: [facility];  Service: Gastroenterology;;   TENDON REPAIR  2005   R elbow   TOTAL KNEE ARTHROPLASTY  05/28/2011   Procedure: TOTAL KNEE ARTHROPLASTY;  Surgeon: Sieving JULIANNA Chancy, MD;  [facility]: [facility];  Service: Orthopedics;  Laterality: Right;  Right total knee arthroplasty  US  ECHOCARDIOGRAPHY  08/2010   Current Outpatient Medications on File Prior to Visit  Medication Sig Dispense Refill   HYDROcodone  bit-homatropine (HYCODAN) 5-1.5 MG/5ML syrup Take 5 mLs by mouth every 8 (eight) hours as needed for cough. 120 mL 0   Magnesium Gluconate 550 MG TABS Take 225 mg by mouth daily.     Magnesium Oxide 200 MG  TABS Take 400 mg by mouth at bedtime.     metoprolol  succinate (TOPROL -XL) 25 MG 24 hr tablet TAKE 1/2 TABLET BY MOUTH DAILY 45 tablet 0   silodosin  (RAPAFLO ) 8 MG CAPS capsule Take 1 capsule (8 mg total) by mouth every other day 45 capsule 3   simvastatin  (ZOCOR ) 40 MG tablet Take 1 tablet (40 mg total) by mouth daily at 6 PM. 15 tablet 0   [DISCONTINUED] promethazine  (PHENERGAN ) 25 MG tablet Take 1 tablet (25 mg total) by mouth every 8 (eight) hours as needed for nausea or vomiting. 20 tablet 0   No current facility-administered medications on file prior to visit.   Allergies  Allergen Reactions   Codeine  Nausea And Vomiting   Lipitor [Atorvastatin Calcium] Other (See Comments)    myalgias   Social History   Socioeconomic History   Marital status: Married    Spouse name: Not on file   Number of children: 2   Years of education: Not on file   Highest education level: Not on file  Occupational History   Not on file  Tobacco Use   Smoking status: Former    Current packs/day: 0.00    Types: Cigarettes    Quit date: 06/23/1997    Years since quitting: 26.5   Smokeless tobacco: Former  Building services engineer status: Never Used  Substance and Sexual Activity   Alcohol use: Yes    Comment: occasional beer   Drug use: No   Sexual activity: Yes  Other Topics Concern   Not on file  Social History Narrative   [facility] (04/08/17):   Originally from [facility]. Has lived in [place] as well. Previously worked for the [employer], [employer] as a Curator. Has 2 dogs & a rabbit currently. No bird exposure. He reports black mold was found in his air conditioner at [facility] when it was changed out 2 years ago. No hot tub exposure. Enjoys fishing.    Married since 1974.    2 sons   3 grandchildren.   Social Drivers of Corporate investment banker Strain: Low Risk  (07/02/2023)   Overall Financial Resource Strain (CARDIA)    Difficulty of Paying Living Expenses: Not hard at all  Food Insecurity:  No Food Insecurity (07/02/2023)   Hunger Vital Sign    Worried About Running Out of Food in the Last Year: Never true    Ran Out of Food in the Last Year: Never true  Transportation Needs: No Transportation Needs (07/02/2023)   PRAPARE - Administrator, Civil Service (Medical): No    Lack of Transportation (Non-Medical): No  Physical Activity: Insufficiently Active (07/02/2023)   Exercise Vital Sign    Days of Exercise per Week: 3 days    Minutes of Exercise per Session: 40 min  Stress: No Stress Concern Present (07/02/2023)   [employer] of Occupational Health - Occupational Stress Questionnaire    Feeling of Stress : Not at all  Social Connections: Socially Integrated (07/02/2023)   Social Connection and Isolation Panel    Frequency of Communication with Friends and Family:  More than three times a week    Frequency of Social Gatherings with Friends and Family: More than three times a week    Attends Religious Services: More than 4 times per year    Active Member of [employer] or Organizations: Yes    Attends Engineer, structural: More than 4 times per year    Marital Status: Married  Catering manager Violence: Not At Risk (07/02/2023)   Humiliation, Afraid, Rape, and Kick questionnaire    Fear of Current or Ex-Partner: No    Emotionally Abused: No    Physically Abused: No    Sexually Abused: No   Family History  Problem Relation Age of Onset   Heart disease Mother    Atrial fibrillation Mother    Heart disease Father    Rheum arthritis Father    Bladder Cancer Father    Atrial fibrillation Father    Brain cancer Maternal Uncle    Lung disease Neg Hx      Review of Systems  All other systems reviewed and are negative.      Objective:   Physical Exam Vitals and nursing note reviewed.  Constitutional:      General: He is not in acute distress.    Appearance: Normal appearance. He is well-developed. He is not ill-appearing, toxic-appearing or diaphoretic.   HENT:     Head: Normocephalic and atraumatic.     Right Ear: Tympanic membrane and ear canal normal.     Left Ear: Tympanic membrane and ear canal normal.     Nose: Congestion and rhinorrhea present.     Right Sinus: No maxillary sinus tenderness or frontal sinus tenderness.     Left Sinus: No maxillary sinus tenderness or frontal sinus tenderness.     Mouth/Throat:     Mouth: Mucous membranes are moist.     Pharynx: Oropharynx is clear. No oropharyngeal exudate or posterior oropharyngeal erythema.  Eyes:     Conjunctiva/sclera: Conjunctivae normal.  Cardiovascular:     Rate and Rhythm: Normal rate and regular rhythm.     Heart sounds: Normal heart sounds.  Pulmonary:     Effort: Pulmonary effort is normal. No respiratory distress.     Breath sounds: No wheezing, rhonchi or rales.  Neurological:     Mental Status: He is alert.         Assessment & Plan:  Allergic rhinitis, unspecified seasonality, unspecified trigger I do not believe this is an infection is much as allergies.  There are no signs of any systemic illness other than allergies causing sinus congestion.  He is already on an antihistamine.  We discussed 2 options.  1 option is to add Flonase  to his antihistamine and allow time for this to pass.  The second option is to take prednisone .  We discussed the pros and cons of both.  He elects to try prednisone  taper pack

## 2024-02-01 ENCOUNTER — Other Ambulatory Visit: Payer: Self-pay | Admitting: Physician Assistant

## 2024-02-03 DIAGNOSIS — H43811 Vitreous degeneration, right eye: Secondary | ICD-10-CM | POA: Diagnosis not present

## 2024-02-10 ENCOUNTER — Other Ambulatory Visit: Payer: Self-pay | Admitting: Physician Assistant

## 2024-02-15 ENCOUNTER — Other Ambulatory Visit: Payer: Self-pay | Admitting: Physician Assistant

## 2024-02-15 NOTE — Progress Notes (Signed)
   02/15/2024  Patient ID: Theodore Davis, male   DOB: 1946-04-16, 78 y.o.   MRN: [id number]  Pharmacy Quality Measure Review  This patient is appearing on a report for being at risk of failing the adherence measure for cholesterol (statin) medications this calendar year.   Medication: simvastatin  40mg  #15/15DS on 8/1/5 Insurance report was not up to date. No action needed at this time.   Lang Sieve, PharmD, BCGP Clinical Pharmacist  [phone]

## 2024-02-20 ENCOUNTER — Other Ambulatory Visit: Payer: Self-pay | Admitting: Physician Assistant

## 2024-02-23 ENCOUNTER — Ambulatory Visit (INDEPENDENT_AMBULATORY_CARE_PROVIDER_SITE_OTHER): Admitting: Family Medicine

## 2024-02-23 ENCOUNTER — Encounter: Payer: Self-pay | Admitting: Family Medicine

## 2024-02-23 VITALS — BP 130/72 | HR 52 | Temp 97.8°F | Ht 69.0 in | Wt 185.8 lb

## 2024-02-23 DIAGNOSIS — E785 Hyperlipidemia, unspecified: Secondary | ICD-10-CM | POA: Diagnosis not present

## 2024-02-23 LAB — COMPREHENSIVE METABOLIC PANEL WITH GFR
AG Ratio: 1.7 (calc) (ref 1.0–2.5)
ALT: 15 U/L (ref 9–46)
AST: 17 U/L (ref 10–35)
Albumin: 4 g/dL (ref 3.6–5.1)
Alkaline phosphatase (APISO): 65 U/L (ref 35–144)
BUN: 12 mg/dL (ref 7–25)
CO2: 30 mmol/L (ref 20–32)
Calcium: 9.3 mg/dL (ref 8.6–10.3)
Chloride: 105 mmol/L (ref 98–110)
Creat: 0.92 mg/dL (ref 0.70–1.28)
Globulin: 2.3 g/dL (ref 1.9–3.7)
Glucose, Bld: 114 mg/dL — ABNORMAL HIGH (ref 65–99)
Potassium: 4.5 mmol/L (ref 3.5–5.3)
Sodium: 142 mmol/L (ref 135–146)
Total Bilirubin: 1.7 mg/dL — ABNORMAL HIGH (ref 0.2–1.2)
Total Protein: 6.3 g/dL (ref 6.1–8.1)
eGFR: 87 mL/min/1.73m2 (ref >=60)

## 2024-02-23 LAB — LIPID PANEL
Cholesterol: 171 mg/dL (ref <200)
HDL: 53 mg/dL (ref >=40)
LDL Cholesterol (Calc): 95 mg/dL
Non-HDL Cholesterol (Calc): 118 mg/dL (ref <130)
Total CHOL/HDL Ratio: 3.2 (calc) (ref <5.0)
Triglycerides: 133 mg/dL (ref <150)

## 2024-02-23 MED ORDER — SIMVASTATIN 40 MG PO TABS: 40.0000 mg | ORAL_TABLET | Freq: Every day | ORAL | 3 refills | Status: AC

## 2024-02-23 MED ORDER — SIMVASTATIN 40 MG PO TABS: 40.0000 mg | ORAL_TABLET | Freq: Every day | ORAL | 1 refills | Status: DC

## 2024-02-23 NOTE — Progress Notes (Signed)
 Subjective:    Patient ID: Theodore Davis, male    DOB: [birth year], 78 y.o.   MRN: [id number] Due to recheck cholesterol  Denies myalgia or ruq pain. Denies cp, sob, doe.  BP at goal.  On simastatin.   Past Medical History:  Diagnosis Date   Arthritis 10/03/2010   right sternoclavicular joint   Heart murmur    Hemangioma of spine    lower lumbar   HOH (hard of hearing)    R ear   Hyperlipidemia    hypercholesterolemia   Lyme disease    Past Surgical History:  Procedure Laterality Date   BIOPSY  08/28/2021   Procedure: BIOPSY;  Surgeon: Eartha Angelia Sieving, MD;  [facility];  Service: Gastroenterology;;   CARDIOVASCULAR STRESS TEST  08/2010   CHOLECYSTECTOMY     COLONOSCOPY WITH PROPOFOL  N/A 08/28/2021   Procedure: COLONOSCOPY WITH PROPOFOL ;  Surgeon: Eartha Angelia Sieving, MD;  [facility];  Service: Gastroenterology;  Laterality: N/A;  1045  ASA 1   ENDOSCOPIC MUCOSAL RESECTION  09/18/2021   Procedure: ENDOSCOPIC MUCOSAL RESECTION;  Surgeon: Wilhelmenia Aloha Raddle., MD;  [facility];  Service: Gastroenterology;;   EUS N/A 09/18/2021   Procedure: LOWER ENDOSCOPIC ULTRASOUND (EUS);  Surgeon: Wilhelmenia Aloha Raddle., MD;  Location: [facility];  Service: Gastroenterology;  Laterality: N/A;   FLEXIBLE SIGMOIDOSCOPY N/A 09/18/2021   Procedure: FLEXIBLE SIGMOIDOSCOPY;  Surgeon: Wilhelmenia Aloha Raddle., MD;  Location: [facility];  Service: Gastroenterology;  Laterality: N/A;   HEMOSTASIS CLIP PLACEMENT  09/18/2021   Procedure: HEMOSTASIS CLIP PLACEMENT;  Surgeon: Wilhelmenia Aloha Raddle., MD;  Location: [facility];  Service: Gastroenterology;;   HERNIA REPAIR     R inguinal   JOINT REPLACEMENT Right    MASTOID DEBRIDEMENT     R ear   POLYPECTOMY  08/28/2021   Procedure: POLYPECTOMY;  Surgeon: Eartha Angelia Sieving, MD;  [facility];  Service: Gastroenterology;;   POLYPECTOMY  09/18/2021   Procedure: POLYPECTOMY;  Surgeon:  Wilhelmenia Aloha Raddle., MD;  Location: [facility];  Service: Gastroenterology;;   RESECTION DISTAL CLAVICAL Left 11/30/2014   Procedure: RESECTION DISTAL CLAVICAL;  Surgeon: Sieving Chancy, MD;  Location: [facility];  Service: Orthopedics;  Laterality: Left;   SHOULDER ARTHROSCOPY WITH BICEPSTENOTOMY Left 11/30/2014   Procedure: SHOULDER ARTHROSCOPY WITH BICEPSTENOTOMY;  Surgeon: Sieving Chancy, MD;  Location: [facility];  Service: Orthopedics;  Laterality: Left;   SHOULDER ARTHROSCOPY WITH ROTATOR CUFF REPAIR AND SUBACROMIAL DECOMPRESSION Left 11/30/2014   Procedure: LEFT SHOULDER ARTHROSCOPY WITH DISTAL CLAVICAL EXCISION, ACROMIOPLASTY, ROTATOR CUFF REPAIR , BICEPS TENOTOMY;  Surgeon: Sieving Chancy, MD;  Location: [facility];  Service: Orthopedics;  Laterality: Left;   SUBMUCOSAL LIFTING INJECTION  09/18/2021   Procedure: SUBMUCOSAL LIFTING INJECTION;  Surgeon: Wilhelmenia Aloha Raddle., MD;  Location: [facility];  Service: Gastroenterology;;   TENDON REPAIR  2005   R elbow   TOTAL KNEE ARTHROPLASTY  05/28/2011   Procedure: TOTAL KNEE ARTHROPLASTY;  Surgeon: Sieving JULIANNA Chancy, MD;  Location: [facility];  Service: Orthopedics;  Laterality: Right;  Right total knee arthroplasty    US  ECHOCARDIOGRAPHY  08/2010   Current Outpatient Medications on File Prior to Visit  Medication Sig Dispense Refill   HYDROcodone  bit-homatropine (HYCODAN) 5-1.5 MG/5ML syrup Take 5 mLs by mouth every 8 (eight) hours as needed for cough. 120 mL 0   Magnesium Gluconate 550 MG TABS Take 225 mg by mouth daily.     Magnesium Oxide  200 MG TABS Take 400 mg by mouth at bedtime.     metoprolol  succinate (TOPROL -XL) 25 MG 24 hr tablet TAKE 1/2 TABLET BY MOUTH DAILY 45 tablet 0   predniSONE  (DELTASONE ) 20 MG tablet 3 tabs poqday 1-2, 2 tabs poqday 3-4, 1 tab poqday 5-6 12 tablet 0   silodosin  (RAPAFLO ) 8 MG CAPS capsule Take 1 capsule (8 mg total) by mouth every other day 45 capsule 3    [DISCONTINUED] promethazine  (PHENERGAN ) 25 MG tablet Take 1 tablet (25 mg total) by mouth every 8 (eight) hours as needed for nausea or vomiting. 20 tablet 0   No current facility-administered medications on file prior to visit.   Allergies  Allergen Reactions   Codeine  Nausea And Vomiting   Lipitor [Atorvastatin Calcium] Other (See Comments)    myalgias   Social History   Socioeconomic History   Marital status: Married    Spouse name: Not on file   Number of children: 2   Years of education: Not on file   Highest education level: Not on file  Occupational History   Not on file  Tobacco Use   Smoking status: Former    Current packs/day: 0.00    Types: Cigarettes    Quit date: 06/23/1997    Years since quitting: 26.6   Smokeless tobacco: Former  Building services engineer status: Never Used  Substance and Sexual Activity   Alcohol use: Yes    Comment: occasional beer   Drug use: No   Sexual activity: Yes  Other Topics Concern   Not on file  Social History Narrative   [facility] (04/08/17):   Originally from [facility]. Has lived in [place] as well. Previously worked for the Designer, multimedia as a Curator. Has 2 dogs & a rabbit currently. No bird exposure. He reports black mold was found in his air conditioner at [facility] when it was changed out 2 years ago. No hot tub exposure. Enjoys fishing.    Married since 1974.    2 sons   3 grandchildren.   Social Drivers of Corporate investment banker Strain: Low Risk  (07/02/2023)   Overall Financial Resource Strain (CARDIA)    Difficulty of Paying Living Expenses: Not hard at all  Food Insecurity: No Food Insecurity (07/02/2023)   Hunger Vital Sign    Worried About Running Out of Food in the Last Year: Never true    Ran Out of Food in the Last Year: Never true  Transportation Needs: No Transportation Needs (07/02/2023)   PRAPARE - Administrator, Civil Service (Medical): No    Lack of Transportation (Non-Medical): No   Physical Activity: Insufficiently Active (07/02/2023)   Exercise Vital Sign    Days of Exercise per Week: 3 days    Minutes of Exercise per Session: 40 min  Stress: No Stress Concern Present (07/02/2023)   [employer] of Occupational Health - Occupational Stress Questionnaire    Feeling of Stress : Not at all  Social Connections: Socially Integrated (07/02/2023)   Social Connection and Isolation Panel    Frequency of Communication with Friends and Family: More than three times a week    Frequency of Social Gatherings with Friends and Family: More than three times a week    Attends Religious Services: More than 4 times per year    Active Member of [employer] or Organizations: Yes    Attends Banker Meetings: More than 4 times per year  Marital Status: Married  Catering manager Violence: Not At Risk (07/02/2023)   Humiliation, Afraid, Rape, and Kick questionnaire    Fear of Current or Ex-Partner: No    Emotionally Abused: No    Physically Abused: No    Sexually Abused: No   Family History  Problem Relation Age of Onset   Heart disease Mother    Atrial fibrillation Mother    Heart disease Father    Rheum arthritis Father    Bladder Cancer Father    Atrial fibrillation Father    Brain cancer Maternal Uncle    Lung disease Neg Hx      Review of Systems  All other systems reviewed and are negative.      Objective:   Physical Exam Vitals and nursing note reviewed.  Constitutional:      General: He is not in acute distress.    Appearance: He is well-developed. He is not diaphoretic.  Cardiovascular:     Rate and Rhythm: Normal rate and regular rhythm.     Heart sounds: Normal heart sounds.  Pulmonary:     Effort: Pulmonary effort is normal. No respiratory distress.     Breath sounds: Normal breath sounds. No wheezing.  Musculoskeletal:     Right lower leg: No edema.     Left lower leg: No edema.  Neurological:     General: No focal deficit present.      Mental Status: He is oriented to person, place, and time. Mental status is at baseline.     Cranial Nerves: No cranial nerve deficit.     Sensory: No sensory deficit.     Motor: No weakness.     Coordination: Coordination normal.     Gait: Gait normal.     Deep Tendon Reflexes: Reflexes normal.         Assessment & Plan:  Hyperlipidemia LDL goal <70 - Plan: Comprehensive metabolic panel with GFR, Lipid panel, simvastatin  (ZOCOR ) 40 MG tablet, DISCONTINUED: simvastatin  (ZOCOR ) 40 MG tablet Goal ldl is less than 70. Check cmp and flp today.  Recommended flu shot.  BP is excellent.

## 2024-02-25 ENCOUNTER — Ambulatory Visit: Payer: Self-pay | Admitting: Family Medicine

## 2024-03-10 ENCOUNTER — Inpatient Hospital Stay
Admit: 2024-03-10 | Discharge: 2024-03-10 | Disposition: A | Discharge status: Home / Self Care | Payer: Medicare (Managed Care) | Arrived: WI

## 2024-03-10 ENCOUNTER — Emergency Department: Admit: 2024-03-10 | Payer: Medicare (Managed Care) | Primary: Internal Medicine

## 2024-03-10 DIAGNOSIS — J40 Bronchitis, not specified as acute or chronic: Principal | ICD-10-CM

## 2024-03-10 LAB — COVID-19 & INFLUENZA COMBO
Influenza A: NOT DETECTED
Influenza B: NOT DETECTED
SARS-CoV-2 RNA, RT PCR: NOT DETECTED

## 2024-03-10 MED ORDER — MUCINEX DM 30-600 MG PO TB12
30-600 | ORAL_TABLET | Freq: Two times a day (BID) | ORAL | 0 refills | 48.50000 days | Status: AC
Start: 2024-03-10 — End: 2024-03-15

## 2024-03-10 NOTE — ED Provider Notes (Signed)
 [facility]     NAME: Troy Mcdaniel  DOB:  1946-02-12  MRN:  [id number]  Date of evaluation: 03/10/2024  Provider: Annabel Essex, APRN - CNP  PCP: Aimee Nile FERNS, MD  Note Started : 4:05 PM EDT 03/10/24      Chief Complaint: Cough (Pt stated the he had a ear ache last week and ATB was rx. Now this week he is congestion, & coughing. )        Troy Mcdaniel is a 78 y.o. male presenting to the ED for cough and congestion.  Patient states an increased productive cough and congestion for the past 5 days.  Patient states symptoms are ongoing and and moderate with no exacerbating relieving factors to   Mention.  Patient was treated for an earache and prescribed amoxicillin  on 03/01/24 x 7 days patient states he finished the course as prescribed.  Patient states that he called his pulmonologist and was prescribed prednisone  which he is still taking.  Patient states he has a history of asthma.  Patient using nebulizer at home.  Patient currently 97% on room air.  Patient denies any chest pain abdominal pain nausea vomiting or diarrhea.  Patient is alert and oriented answering questions appropriately in no acute distress.    ROS:   Pertinent positives and negatives are stated within HPI, all other systems reviewed and are negative.  --------------------------------------------- PAST HISTORY ---------------------------------------------  Past Medical History:  has a past medical history of Asthma, Cancer (HCC), Hyperlipidemia, and Hypertension.    Past Surgical History:  has a past surgical history that includes Prostatectomy; Colonoscopy; Cataract extraction (Right); Pectoralis major repair; and Eye surgery (Left, 11/12/2021).    Social History:  reports that he quit smoking about 5 years ago. His smoking use included cigarettes. He started smoking about 20 years ago. He has a 14.9 pack-year smoking history. He has never used smokeless tobacco. He reports current alcohol use of about 1.0 standard drink  of alcohol per week. He reports that he does not use drugs.    Family History: family history includes Cancer in his father; Dementia in his mother.     The patient's home medications have been reviewed.    Allergies: Patient has no known allergies.    -------------------------------------------------- RESULTS -------------------------------------------------  All laboratory and radiology results have been personally reviewed by myself   LABS:  Results for orders placed or performed during the hospital encounter of 03/10/24   COVID-19 & Influenza Combo    Specimen: Nasopharyngeal Swab   Result Value Ref Range    Specimen Description .NASOPHARYNGEAL SWAB     Source .NASOPHARYNGEAL SWAB     SARS-CoV-2 RNA, RT PCR Not Detected Not Detected    Influenza A Not Detected Not Detected    Influenza B Not Detected Not Detected       RADIOLOGY:  Interpreted by Radiologist.  XR CHEST (2 VW)   Final Result   No acute process.      No significant interval change.             ------------------------- NURSING NOTES AND VITALS REVIEWED ---------------------------   The nursing notes within the ED encounter and vital signs as below have been reviewed.   BP 117/67   Pulse 63   Temp 97.8 F (36.6 C)   Resp 20   Wt 95.3 kg (210 lb)   SpO2 97%   BMI 26.96 kg/m   Oxygen Saturation Interpretation: Normal      ---------------------------------------------------  PHYSICAL EXAM--------------------------------------    Constitutional/General: Alert and oriented x3, well appearing, non toxic in NAD  Head: NC/AT  Eyes: PERRL, EOMI  Mouth: Oropharynx clear, no erythema, handling secretions, no trismus  Neck: Supple, full ROM, no meningeal signs  Pulmonary: Lungs diminished bilateral, intermittent rhonchi right lower lobe.  Harsh cough present.  Not in respiratory distress  Cardiovascular:  Regular rate and rhythm, gallops, or rubs. 2+ distal pulses  Abdomen: Soft, non tender, non distended,   Extremities: Moves all extremities x 4. Warm  and well perfused  Skin: warm and dry without rash  Neurologic: GCS 15,  Psych: Normal Affect    ------------------------------ ED COURSE/MEDICAL DECISION MAKING----------------------    Medical Decision Making:    Patient has a history of bronchitis as well as COPD.  With moderate suspicion for COVID-19, testing obtained, negative.  Chest x-ray was also obtained as I do have a suspicion for a pneumonia.  Chest x-ray showing no acute pathology, no pneumonia.  Patient is 97% on room air.  Intermittent rhonchi with a harsh cough.  Patient recently finished a course of antibiotics not even 5 days ago this may be viral in nature or an exacerbation of his COPD, asthma or bronchitis.  Patient is talking in complete sentences without dyspnea and does not appear toxic.  Patient currently prescribed prednisone  from his pulmonologist.      Patient has prescription for albuterol  and prednisone .  I will prescribe the patient Mucinex  dm to help with the cough and congestion.  Antibiotics are not indicated at this time.    Differential diagnosis includes COPD exacerbation, asthma, bronchitis, COVID-19, influenza A/B.  URI viral    Patient's chart reviewed diagnosed with bronchitis in January 2025.    Counseling:   The emergency provider has spoken with the patient and discussed today's results, in addition to providing specific details for the plan of care and counseling regarding the diagnosis and prognosis.  Questions are answered at this time and they are agreeable with the plan.           Lang Led, APRN - CNP  03/10/24 1704

## 2024-04-03 ENCOUNTER — Other Ambulatory Visit: Payer: Self-pay | Admitting: General Practice

## 2024-04-13 ENCOUNTER — Ambulatory Visit: Payer: Self-pay

## 2024-04-13 NOTE — Telephone Encounter (Signed)
 FYI Only or Action Required?: FYI only for provider.  Patient was last seen in primary care on 02/23/2024 by Duanne Butler DASEN, MD.  Called Nurse Triage reporting Urinary Frequency.  Symptoms began x 2 days.  Interventions attempted: OTC medications: Tylenol .  Symptoms are: unchanged.  Triage Disposition: See HCP Within 4 Hours (Or PCP Triage)  Patient/caregiver understands and will follow disposition?: Yes  **Appt. Scheduled for 10/23**            Copied from CRM #[id number]. Topic: Clinical - Red Word Triage >> Apr 13, 2024  7:58 AM Tobias L wrote: Red Word that prompted transfer to Nurse Triage: possible UTI, burning, frequency, weak Reason for Disposition  Side (flank) or lower back pain present  Answer Assessment - Initial Assessment Questions 1. SYMPTOM: What's the main symptom you're concerned about? (e.g., frequency, incontinence)      burning, frequency  2. ONSET: When did the symptoms  start?     X 2 days   3. PAIN: Is there any pain? If Yes, ask: How bad is it? (Scale: 1-10; mild, moderate, severe)     Yes, 8/10- burning sensation   4. CAUSE: What do you think is causing the symptoms?     Possible UTI  5. OTHER SYMPTOMS: Do you have any other symptoms? (e.g., blood in urine, fever, flank pain, pain with urination)   Fatigue, flank pain   Patient is taking Tylenol  for relief , appt. Scheduled for 10/23 and added to wait list ; patient will been seen in UC today if symptoms worsen.  Protocols used: Urinary Symptoms-A-AH

## 2024-04-14 ENCOUNTER — Encounter: Payer: Self-pay | Admitting: Family Medicine

## 2024-04-14 ENCOUNTER — Ambulatory Visit (INDEPENDENT_AMBULATORY_CARE_PROVIDER_SITE_OTHER): Admitting: Family Medicine

## 2024-04-14 VITALS — BP 108/68 | HR 77 | Temp 97.5°F | Ht 69.0 in | Wt 182.5 lb

## 2024-04-14 DIAGNOSIS — R350 Frequency of micturition: Secondary | ICD-10-CM | POA: Diagnosis not present

## 2024-04-14 LAB — URINALYSIS, ROUTINE W REFLEX MICROSCOPIC
Bacteria, UA: NONE SEEN /HPF
Bilirubin Urine: NEGATIVE
Glucose, UA: NEGATIVE
Hyaline Cast: NONE SEEN /LPF
Leukocytes,Ua: NEGATIVE
Nitrite: NEGATIVE
Protein, ur: NEGATIVE
Specific Gravity, Urine: 1.01 (ref 1.001–1.035)
Squamous Epithelial / HPF: NONE SEEN /HPF (ref <=5)
WBC, UA: NONE SEEN /HPF (ref 0–5)
pH: 6 (ref 5.0–8.0)

## 2024-04-14 LAB — MICROSCOPIC MESSAGE

## 2024-04-14 MED ORDER — SULFAMETHOXAZOLE-TRIMETHOPRIM 800-160 MG PO TABS: 1.0000 | ORAL_TABLET | Freq: Two times a day (BID) | ORAL | 0 refills | Status: AC

## 2024-04-14 NOTE — Progress Notes (Signed)
 Subjective:    Patient ID: Theodore Davis, male    DOB: 09/07/1945, 78 y.o.   MRN: [id number]  Patient reports several days of dysuria, frequency, urgency, and pelvic discomfort.  However his urinalysis is negative for nitrates and leukocyte esterase.  It does show trace blood.  He denies any fevers or chills but he does have some mild lower back discomfort.  He denies any nausea or vomiting.  He denies any diarrhea or constipation. Past Medical History:  Diagnosis Date  . Arthritis 10/03/2010   right sternoclavicular joint  . Heart murmur   . Hemangioma of spine    lower lumbar  . HOH (hard of hearing)    R ear  . Hyperlipidemia    hypercholesterolemia  . Lyme disease    Past Surgical History:  Procedure Laterality Date  . BIOPSY  08/28/2021   Procedure: BIOPSY;  Surgeon: Eartha Angelia Sieving, MD;  [facility];  Service: Gastroenterology;;  . CARDIOVASCULAR STRESS TEST  08/2010  . CHOLECYSTECTOMY    . COLONOSCOPY WITH PROPOFOL  N/A 08/28/2021   Procedure: COLONOSCOPY WITH PROPOFOL ;  Surgeon: Eartha Angelia Sieving, MD;  [facility];  Service: Gastroenterology;  Laterality: N/A;  1045  ASA 1  . ENDOSCOPIC MUCOSAL RESECTION  09/18/2021   Procedure: ENDOSCOPIC MUCOSAL RESECTION;  Surgeon: Wilhelmenia Aloha Raddle., MD;  [facility]: [facility];  Service: Gastroenterology;;  . EUS N/A 09/18/2021   Procedure: LOWER ENDOSCOPIC ULTRASOUND (EUS);  Surgeon: Wilhelmenia Aloha Raddle., MD;  Location: [facility];  Service: Gastroenterology;  Laterality: N/A;  . FLEXIBLE SIGMOIDOSCOPY N/A 09/18/2021   Procedure: FLEXIBLE SIGMOIDOSCOPY;  Surgeon: Wilhelmenia Aloha Raddle., MD;  Location: [facility];  Service: Gastroenterology;  Laterality: N/A;  . HEMOSTASIS CLIP PLACEMENT  09/18/2021   Procedure: HEMOSTASIS CLIP PLACEMENT;  Surgeon: Wilhelmenia Aloha Raddle., MD;  [facility]: [facility];  Service: Gastroenterology;;  . HERNIA REPAIR     R inguinal  . JOINT REPLACEMENT  Right   . MASTOID DEBRIDEMENT     R ear  . POLYPECTOMY  08/28/2021   Procedure: POLYPECTOMY;  Surgeon: Eartha Angelia Sieving, MD;  [facility];  Service: Gastroenterology;;  . POLYPECTOMY  09/18/2021   Procedure: POLYPECTOMY;  Surgeon: Wilhelmenia Aloha Raddle., MD;  Location: [facility];  Service: Gastroenterology;;  . RESECTION DISTAL CLAVICAL Left 11/30/2014   Procedure: RESECTION DISTAL CLAVICAL;  Surgeon: Sieving Chancy, MD;  Location: [facility];  Service: Orthopedics;  Laterality: Left;  . SHOULDER ARTHROSCOPY WITH BICEPSTENOTOMY Left 11/30/2014   Procedure: SHOULDER ARTHROSCOPY WITH BICEPSTENOTOMY;  Surgeon: Sieving Chancy, MD;  Location: [facility];  Service: Orthopedics;  Laterality: Left;  . SHOULDER ARTHROSCOPY WITH ROTATOR CUFF REPAIR AND SUBACROMIAL DECOMPRESSION Left 11/30/2014   Procedure: LEFT SHOULDER ARTHROSCOPY WITH DISTAL CLAVICAL EXCISION, ACROMIOPLASTY, ROTATOR CUFF REPAIR , BICEPS TENOTOMY;  Surgeon: Sieving Chancy, MD;  Location: [facility];  Service: Orthopedics;  Laterality: Left;  . SUBMUCOSAL LIFTING INJECTION  09/18/2021   Procedure: SUBMUCOSAL LIFTING INJECTION;  Surgeon: Wilhelmenia Aloha Raddle., MD;  Location: [facility];  Service: Gastroenterology;;  . TENDON REPAIR  2005   R elbow  . TOTAL KNEE ARTHROPLASTY  05/28/2011   Procedure: TOTAL KNEE ARTHROPLASTY;  Surgeon: Sieving JULIANNA Chancy, MD;  Location: [facility];  Service: Orthopedics;  Laterality: Right;  Right total knee arthroplasty   . US  ECHOCARDIOGRAPHY  08/2010   Current Outpatient Medications on File Prior to Visit  Medication Sig Dispense Refill  . HYDROcodone  bit-homatropine (HYCODAN) 5-1.5 MG/5ML  syrup Take 5 mLs by mouth every 8 (eight) hours as needed for cough. 120 mL 0  . Magnesium Gluconate 550 MG TABS Take 225 mg by mouth daily.    . Magnesium Oxide 200 MG TABS Take 400 mg by mouth at bedtime.    . metoprolol  succinate (TOPROL -XL) 25 MG 24 hr tablet  TAKE 1/2 TABLET BY MOUTH DAILY 45 tablet 0  . predniSONE  (DELTASONE ) 20 MG tablet 3 tabs poqday 1-2, 2 tabs poqday 3-4, 1 tab poqday 5-6 12 tablet 0  . silodosin  (RAPAFLO ) 8 MG CAPS capsule Take 1 capsule (8 mg total) by mouth every other day 45 capsule 3  . simvastatin  (ZOCOR ) 40 MG tablet Take 1 tablet (40 mg total) by mouth daily at 6 PM. 90 tablet 3  . [DISCONTINUED] promethazine  (PHENERGAN ) 25 MG tablet Take 1 tablet (25 mg total) by mouth every 8 (eight) hours as needed for nausea or vomiting. 20 tablet 0   No current facility-administered medications on file prior to visit.   Allergies  Allergen Reactions  . Codeine  Nausea And Vomiting  . Lipitor [Atorvastatin Calcium] Other (See Comments)    myalgias   Social History   Socioeconomic History  . Marital status: Married    Spouse name: Not on file  . Number of children: 2  . Years of education: Not on file  . Highest education level: Not on file  Occupational History  . Not on file  Tobacco Use  . Smoking status: Former    Current packs/day: 0.00    Types: Cigarettes    Quit date: 06/23/1997    Years since quitting: 26.8  . Smokeless tobacco: Former  Advertising account planner  . Vaping status: Never Used  Substance and Sexual Activity  . Alcohol use: Yes    Comment: occasional beer  . Drug use: No  . Sexual activity: Yes  Other Topics Concern  . Not on file  Social History Narrative    Pulmonary (04/08/17):   Originally from [facility]. Has lived in [place] as well. Previously worked for the Designer, multimedia as a Curator. Has 2 dogs & a rabbit currently. No bird exposure. He reports black mold was found in his air conditioner at [facility] when it was changed out 2 years ago. No hot tub exposure. Enjoys fishing.    Married since 1974.    2 sons   3 grandchildren.   Social Drivers of Corporate investment banker Strain: Low Risk  (07/02/2023)   Overall Financial Resource Strain (CARDIA)   . Difficulty of Paying Living Expenses: Not  hard at all  Food Insecurity: No Food Insecurity (07/02/2023)   Hunger Vital Sign   . Worried About Programme researcher, broadcasting/film/video in the Last Year: Never true   . Ran Out of Food in the Last Year: Never true  Transportation Needs: No Transportation Needs (07/02/2023)   PRAPARE - Transportation   . Lack of Transportation (Medical): No   . Lack of Transportation (Non-Medical): No  Physical Activity: Insufficiently Active (07/02/2023)   Exercise Vital Sign   . Days of Exercise per Week: 3 days   . Minutes of Exercise per Session: 40 min  Stress: No Stress Concern Present (07/02/2023)   [employer] of Occupational Health - Occupational Stress Questionnaire   . Feeling of Stress : Not at all  Social Connections: Socially Integrated (07/02/2023)   Social Connection and Isolation Panel   . Frequency of Communication with Friends and Family: More than three times  a week   . Frequency of Social Gatherings with Friends and Family: More than three times a week   . Attends Religious Services: More than 4 times per year   . Active Member of Clubs or Organizations: Yes   . Attends Banker Meetings: More than 4 times per year   . Marital Status: Married  Catering manager Violence: Not At Risk (07/02/2023)   Humiliation, Afraid, Rape, and Kick questionnaire   . Fear of Current or Ex-Partner: No   . Emotionally Abused: No   . Physically Abused: No   . Sexually Abused: No   Family History  Problem Relation Age of Onset  . Heart disease Mother   . Atrial fibrillation Mother   . Heart disease Father   . Rheum arthritis Father   . Bladder Cancer Father   . Atrial fibrillation Father   . Brain cancer Maternal Uncle   . Lung disease Neg Hx      Review of Systems  HENT:  Positive for congestion.   All other systems reviewed and are negative.      Objective:   Physical Exam Vitals and nursing note reviewed.  Constitutional:      General: He is not in acute distress.    Appearance: He is  well-developed. He is not diaphoretic.  Cardiovascular:     Rate and Rhythm: Regular rhythm. Bradycardia present.     Heart sounds: Normal heart sounds.  Pulmonary:     Effort: Pulmonary effort is normal. No respiratory distress.     Breath sounds: Normal breath sounds. No wheezing.  Abdominal:     General: Abdomen is flat. Bowel sounds are normal. There is no distension.     Palpations: Abdomen is soft.     Tenderness: There is no abdominal tenderness. There is no guarding or rebound.  Musculoskeletal:     Right lower leg: No edema.     Left lower leg: No edema.         Assessment & Plan:  Urinary frequency - Plan: Urinalysis, Routine w reflex microscopic Urinalysis is unremarkable.  I believe the patient is doing a prostatitis.  Begin Bactrim  double strength tablets twice daily for 10 days.  Recheck immediately if worsening.

## 2024-04-19 ENCOUNTER — Ambulatory Visit: Admitting: Family Medicine

## 2024-04-19 ENCOUNTER — Encounter: Payer: Self-pay | Admitting: Family Medicine

## 2024-04-19 VITALS — BP 120/72 | HR 53 | Temp 98.0°F | Ht <= 58 in | Wt 181.0 lb

## 2024-04-19 DIAGNOSIS — Z0000 Encounter for general adult medical examination without abnormal findings: Secondary | ICD-10-CM

## 2024-04-19 DIAGNOSIS — Z23 Encounter for immunization: Secondary | ICD-10-CM | POA: Diagnosis not present

## 2024-04-19 DIAGNOSIS — I10 Essential (primary) hypertension: Secondary | ICD-10-CM | POA: Diagnosis not present

## 2024-04-19 DIAGNOSIS — Z125 Encounter for screening for malignant neoplasm of prostate: Secondary | ICD-10-CM

## 2024-04-19 DIAGNOSIS — Z0001 Encounter for general adult medical examination with abnormal findings: Secondary | ICD-10-CM

## 2024-04-19 LAB — CBC WITH DIFFERENTIAL/PLATELET
Absolute Lymphocytes: 1197 {cells}/uL (ref 850–3900)
Absolute Monocytes: 630 {cells}/uL (ref 200–950)
Basophils Absolute: 50 {cells}/uL (ref 0–200)
Basophils Relative: 0.8 %
Eosinophils Absolute: 69 {cells}/uL (ref 15–500)
Eosinophils Relative: 1.1 %
HCT: 51.4 % — ABNORMAL HIGH (ref 38.5–50.0)
Hemoglobin: 17 g/dL (ref 13.2–17.1)
MCH: 31.2 pg (ref 27.0–33.0)
MCHC: 33.1 g/dL (ref 32.0–36.0)
MCV: 94.3 fL (ref 80.0–100.0)
MPV: 11.2 fL (ref 7.5–12.5)
Monocytes Relative: 10 %
Neutro Abs: 4353 {cells}/uL (ref 1500–7800)
Neutrophils Relative %: 69.1 %
Platelets: 253 Thousand/uL (ref 140–400)
RBC: 5.45 Million/uL (ref 4.20–5.80)
RDW: 12.4 % (ref 11.0–15.0)
Total Lymphocyte: 19 %
WBC: 6.3 Thousand/uL (ref 3.8–10.8)

## 2024-04-19 LAB — PSA: PSA: 0.56 ng/mL (ref <=4.00)

## 2024-04-19 NOTE — Addendum Note (Signed)
 Addended by: ANGELENA RONAL BRADLEY K on: 04/19/2024 09:56 AM   Modules accepted: Orders

## 2024-04-19 NOTE — Progress Notes (Signed)
 Subjective:    Patient ID: Theodore Davis, male    DOB: March 25, 1946, 78 y.o.   MRN: [id number] Patient is a very pleasant 78 year old Caucasian gentleman here today for physical exam.  I recently saw the patient for what I thought was prostatitis.  He states that he is doing remarkably better on the Bactrim .  The dysuria has resolved.  The frequency has improved.  The abdominal pain has improved.  He is due for a flu shot today.  His shingles vaccine and his pneumonia vaccine are up-to-date.  He is also due for RSV and COVID. Immunization History  Administered Date(s) Administered  . INFLUENZA, HIGH DOSE SEASONAL PF 04/04/2017  . Influenza-Unspecified 04/07/2022  . PFIZER(Purple Top)SARS-COV-2 Vaccination 08/14/2019, 09/06/2019, 04/25/2020  . Pneumococcal Conjugate-13 10/18/2013  . Pneumococcal Polysaccharide-23 10/24/2014  . Td 10/17/2010  . Tdap 10/17/2010   He declines RSV and COVID but he would like to get a flu shot.  He is due for prostate cancer screening.  Patient also had a colonoscopy in 2023.  Due to the presence of a tubular adenoma I recommended a repeat colonoscopy in 5 years.  At that time it will be 2028.  We will discuss this further as he becomes closer to this age.  Otherwise he is doing well with no concerns.  He had a CMP and a lipid panel on September the outstanding except for a blood sugar 114.  He has known prediabetes.  He is aware of a low carbohydrate diet.  He is due for a CBC and a PSA  Past Medical History:  Diagnosis Date  . Arthritis 10/03/2010   right sternoclavicular joint  . Heart murmur   . Hemangioma of spine    lower lumbar  . HOH (hard of hearing)    R ear  . Hyperlipidemia    hypercholesterolemia  . Lyme disease    Past Surgical History:  Procedure Laterality Date  . BIOPSY  08/28/2021   Procedure: BIOPSY;  Surgeon: Eartha Angelia Sieving, MD;  [facility];  Service: Gastroenterology;;  . CARDIOVASCULAR STRESS TEST  08/2010  .  CHOLECYSTECTOMY    . COLONOSCOPY WITH PROPOFOL  N/A 08/28/2021   Procedure: COLONOSCOPY WITH PROPOFOL ;  Surgeon: Eartha Angelia Sieving, MD;  [facility];  Service: Gastroenterology;  Laterality: N/A;  1045  ASA 1  . ENDOSCOPIC MUCOSAL RESECTION  09/18/2021   Procedure: ENDOSCOPIC MUCOSAL RESECTION;  Surgeon: Wilhelmenia Aloha Raddle., MD;  [facility];  Service: Gastroenterology;;  . EUS N/A 09/18/2021   Procedure: LOWER ENDOSCOPIC ULTRASOUND (EUS);  Surgeon: Wilhelmenia Aloha Raddle., MD;  Location: [facility];  Service: Gastroenterology;  Laterality: N/A;  . FLEXIBLE SIGMOIDOSCOPY N/A 09/18/2021   Procedure: FLEXIBLE SIGMOIDOSCOPY;  Surgeon: Wilhelmenia Aloha Raddle., MD;  Location: [facility];  Service: Gastroenterology;  Laterality: N/A;  . HEMOSTASIS CLIP PLACEMENT  09/18/2021   Procedure: HEMOSTASIS CLIP PLACEMENT;  Surgeon: Wilhelmenia Aloha Raddle., MD;  [facility];  Service: Gastroenterology;;  . HERNIA REPAIR     R inguinal  . JOINT REPLACEMENT Right   . MASTOID DEBRIDEMENT     R ear  . POLYPECTOMY  08/28/2021   Procedure: POLYPECTOMY;  Surgeon: Eartha Angelia Sieving, MD;  [facility];  Service: Gastroenterology;;  . POLYPECTOMY  09/18/2021   Procedure: POLYPECTOMY;  Surgeon: Wilhelmenia Aloha Raddle., MD;  Location: [facility];  Service: Gastroenterology;;  . RESECTION DISTAL CLAVICAL Left 11/30/2014   Procedure: RESECTION DISTAL CLAVICAL;  Surgeon: Sieving Chancy, MD;  Location: [facility];  Service: Orthopedics;  Laterality: Left;  . SHOULDER ARTHROSCOPY WITH BICEPSTENOTOMY Left 11/30/2014   Procedure: SHOULDER ARTHROSCOPY WITH BICEPSTENOTOMY;  Surgeon: Toribio Chancy, MD;  Location: [facility];  Service: Orthopedics;  Laterality: Left;  . SHOULDER ARTHROSCOPY WITH ROTATOR CUFF REPAIR AND SUBACROMIAL DECOMPRESSION Left 11/30/2014   Procedure: LEFT SHOULDER ARTHROSCOPY WITH DISTAL CLAVICAL EXCISION, ACROMIOPLASTY,  ROTATOR CUFF REPAIR , BICEPS TENOTOMY;  Surgeon: Toribio Chancy, MD;  [facility];  Service: Orthopedics;  Laterality: Left;  . SUBMUCOSAL LIFTING INJECTION  09/18/2021   Procedure: SUBMUCOSAL LIFTING INJECTION;  Surgeon: Wilhelmenia Aloha Raddle., MD;  Location: [facility];  Service: Gastroenterology;;  . TENDON REPAIR  2005   R elbow  . TOTAL KNEE ARTHROPLASTY  05/28/2011   Procedure: TOTAL KNEE ARTHROPLASTY;  Surgeon: Toribio JULIANNA Chancy, MD;  Location: [facility];  Service: Orthopedics;  Laterality: Right;  Right total knee arthroplasty   . US  ECHOCARDIOGRAPHY  08/2010   Current Outpatient Medications on File Prior to Visit  Medication Sig Dispense Refill  . HYDROcodone  bit-homatropine (HYCODAN) 5-1.5 MG/5ML syrup Take 5 mLs by mouth every 8 (eight) hours as needed for cough. 120 mL 0  . Magnesium Gluconate 550 MG TABS Take 225 mg by mouth daily.    . Magnesium Oxide 200 MG TABS Take 400 mg by mouth at bedtime.    . metoprolol  succinate (TOPROL -XL) 25 MG 24 hr tablet TAKE 1/2 TABLET BY MOUTH DAILY 45 tablet 0  . predniSONE  (DELTASONE ) 20 MG tablet 3 tabs poqday 1-2, 2 tabs poqday 3-4, 1 tab poqday 5-6 12 tablet 0  . silodosin  (RAPAFLO ) 8 MG CAPS capsule Take 1 capsule (8 mg total) by mouth every other day 45 capsule 3  . simvastatin  (ZOCOR ) 40 MG tablet Take 1 tablet (40 mg total) by mouth daily at 6 PM. 90 tablet 3  . sulfamethoxazole -trimethoprim  (BACTRIM  DS) 800-160 MG tablet Take 1 tablet by mouth 2 (two) times daily. 20 tablet 0  . [DISCONTINUED] promethazine  (PHENERGAN ) 25 MG tablet Take 1 tablet (25 mg total) by mouth every 8 (eight) hours as needed for nausea or vomiting. 20 tablet 0   No current facility-administered medications on file prior to visit.   Allergies  Allergen Reactions  . Codeine  Nausea And Vomiting  . Lipitor [Atorvastatin Calcium] Other (See Comments)    myalgias   Social History   Socioeconomic History  . Marital status: Married    Spouse  name: Not on file  . Number of children: 2  . Years of education: Not on file  . Highest education level: Not on file  Occupational History  . Not on file  Tobacco Use  . Smoking status: Former    Current packs/day: 0.00    Types: Cigarettes    Quit date: 06/23/1997    Years since quitting: 26.8  . Smokeless tobacco: Former  Advertising Account Planner  . Vaping status: Never Used  Substance and Sexual Activity  . Alcohol use: Yes    Comment: occasional beer  . Drug use: No  . Sexual activity: Yes  Other Topics Concern  . Not on file  Social History Narrative   [facility] (04/08/17):   Originally from [facility]. Has lived in [place] as well. Previously worked for the designer, multimedia as a curator. Has 2 dogs & a rabbit currently. No bird exposure. He reports black mold was found in his air conditioner at [facility] when it was changed out 2 years ago.  No hot tub exposure. Enjoys fishing.    Married since 1974.    2 sons   3 grandchildren.   Social Drivers of Corporate Investment Banker Strain: Low Risk  (07/02/2023)   Overall Financial Resource Strain (CARDIA)   . Difficulty of Paying Living Expenses: Not hard at all  Food Insecurity: No Food Insecurity (07/02/2023)   Hunger Vital Sign   . Worried About Programme Researcher, Broadcasting/film/video in the Last Year: Never true   . Ran Out of Food in the Last Year: Never true  Transportation Needs: No Transportation Needs (07/02/2023)   PRAPARE - Transportation   . Lack of Transportation (Medical): No   . Lack of Transportation (Non-Medical): No  Physical Activity: Insufficiently Active (07/02/2023)   Exercise Vital Sign   . Days of Exercise per Week: 3 days   . Minutes of Exercise per Session: 40 min  Stress: No Stress Concern Present (07/02/2023)   [employer] of Occupational Health - Occupational Stress Questionnaire   . Feeling of Stress : Not at all  Social Connections: Socially Integrated (07/02/2023)   Social Connection and Isolation Panel   . Frequency of  Communication with Friends and Family: More than three times a week   . Frequency of Social Gatherings with Friends and Family: More than three times a week   . Attends Religious Services: More than 4 times per year   . Active Member of Clubs or Organizations: Yes   . Attends Banker Meetings: More than 4 times per year   . Marital Status: Married  Catering Manager Violence: Not At Risk (07/02/2023)   Humiliation, Afraid, Rape, and Kick questionnaire   . Fear of Current or Ex-Partner: No   . Emotionally Abused: No   . Physically Abused: No   . Sexually Abused: No   Family History  Problem Relation Age of Onset  . Heart disease Mother   . Atrial fibrillation Mother   . Heart disease Father   . Rheum arthritis Father   . Bladder Cancer Father   . Atrial fibrillation Father   . Brain cancer Maternal Uncle   . Lung disease Neg Hx      Review of Systems  All other systems reviewed and are negative.      Objective:   Physical Exam Vitals and nursing note reviewed.  Constitutional:      General: He is not in acute distress.    Appearance: Normal appearance. He is well-developed and normal weight. He is not ill-appearing, toxic-appearing or diaphoretic.  HENT:     Head: Normocephalic and atraumatic.     Right Ear: Tympanic membrane and ear canal normal.     Left Ear: Tympanic membrane and ear canal normal.     Nose: Nose normal. No congestion or rhinorrhea.     Mouth/Throat:     Mouth: Mucous membranes are moist.     Pharynx: Oropharynx is clear. No oropharyngeal exudate or posterior oropharyngeal erythema.  Eyes:     General:        Right eye: No discharge.        Left eye: No discharge.     Extraocular Movements: Extraocular movements intact.     Conjunctiva/sclera: Conjunctivae normal.     Pupils: Pupils are equal, round, and reactive to light.  Neck:     Vascular: No carotid bruit.  Cardiovascular:     Rate and Rhythm: Normal rate and regular rhythm.      Pulses: Normal  pulses.     Heart sounds: Murmur heard.     No friction rub. No gallop.  Pulmonary:     Effort: Pulmonary effort is normal. No respiratory distress.     Breath sounds: Normal breath sounds. No stridor. No wheezing, rhonchi or rales.  Abdominal:     General: Abdomen is flat. Bowel sounds are normal. There is no distension.     Palpations: Abdomen is soft. There is no mass.     Tenderness: There is no abdominal tenderness. There is no guarding or rebound.     Hernia: No hernia is present.  Musculoskeletal:        General: No swelling, tenderness, deformity or signs of injury.     Cervical back: Normal range of motion and neck supple. No rigidity.     Right lower leg: No edema.     Left lower leg: No edema.  Lymphadenopathy:     Cervical: No cervical adenopathy.  Skin:    Coloration: Skin is not jaundiced or pale.     Findings: No bruising, erythema, lesion or rash.  Neurological:     General: No focal deficit present.     Mental Status: He is alert and oriented to person, place, and time. Mental status is at baseline.     Cranial Nerves: No cranial nerve deficit.     Sensory: No sensory deficit.     Motor: No weakness.     Coordination: Coordination normal.     Gait: Gait normal.     Deep Tendon Reflexes: Reflexes normal.  Psychiatric:        Mood and Affect: Mood normal.        Behavior: Behavior normal.        Thought Content: Thought content normal.        Judgment: Judgment normal.     Patient has scattered lipomas    Assessment & Plan:  Prostate cancer screening - Plan: PSA  Benign essential HTN - Plan: CBC with Differential/Platelet  Encounter for Medicare annual wellness exam Physical exam today is normal.  Recent CMP and fasting lipid panel was significant only for prediabetes.  We again reiterated the importance of a low-carb diet.  His blood pressure today is outstanding.  I will check a CBC along with a PSA.  Colon cancer screening is up-to-date.   Patient's pneumonia and shingles vaccines are up-to-date.  He received his flu shot today.  We discussed COVID as well as RSV but he declines these at the present time.  The remainder of his preventative care is up-to-date.  He does have a faint murmur secondary to asymmetric left ventricular hypertrophy.  This was evaluated on an echocardiogram last year

## 2024-04-21 ENCOUNTER — Ambulatory Visit: Payer: Self-pay | Admitting: Family Medicine

## 2024-06-30 ENCOUNTER — Inpatient Hospital Stay
Admit: 2024-06-30 | Discharge: 2024-06-30 | Disposition: A | Discharge status: Home / Self Care | Payer: Medicare (Managed Care) | Arrived: WI

## 2024-06-30 MED ORDER — AZITHROMYCIN 250 MG PO TABS
250 | PACK | ORAL | 0 refills | 5.00000 days | Status: AC
Start: 2024-06-30 — End: 2024-07-10

## 2024-06-30 MED ORDER — PREDNISONE 20 MG PO TABS
20 | ORAL_TABLET | Freq: Every day | ORAL | 0 refills | 4.00000 days | Status: AC
Start: 2024-06-30 — End: 2024-07-05

## 2024-06-30 NOTE — Discharge Instructions (Addendum)
 Flonase steroid nasal spray  OTC cough and cold medications  Use inhaler

## 2024-06-30 NOTE — ED Provider Notes (Signed)
[facility]     NAME: Troy Mcdaniel  DOB:  26-Apr-1946  MRN:  [id number]  Date of evaluation: 06/30/2024  Provider: Oneil DELENA Fend, PA-C  PCP: Aimee Harper I, MD  Note Started : 11:21 AM EST 06/30/24    Chief Complaint: Sinusitis (Headache, runny nose, congestion, mucous since last night. )      This is a 79 year old male that presents to urgent care complaining of initially sinus infection symptoms but also states that over the past day he has been having more coughing and wheezing.  He does have inhalers that he uses and he has had to use those more often.  He denies any lightheadedness.  No chest pain.  No shortness of breath at this time no abdominal pain nausea vomiting diarrhea or urinary symptoms.  On first contact patient he appears to be in no acute distress.        Review of Systems  Pertinent positives and negatives are stated within HPI, all other systems reviewed and are negative.     Allergies: Patient has no known allergies.     --------------------------------------------- PAST HISTORY ---------------------------------------------  Past Medical History:  has a past medical history of Asthma, Cancer (HCC), Hyperlipidemia, and Hypertension.    Past Surgical History:  has a past surgical history that includes Prostatectomy; Colonoscopy; Cataract extraction (Right); Pectoralis major repair; and Eye surgery (Left, 11/12/2021).    Social History:  reports that he quit smoking about 5 years ago. His smoking use included cigarettes. He started smoking about 20 years ago. He has a 14.9 pack-year smoking history. He has never used smokeless tobacco. He reports current alcohol use of about 1.0 standard drink of alcohol per week. He reports that he does not use drugs.    Family History: family history includes Cancer in his father; Dementia in his mother.     The patients home medications have been reviewed.    The nursing notes within the ED encounter have been reviewed.      ------------------------------------------------SCREENINGS----------------------------------------------                        CIWA Assessment  BP: (!) 149/77  Pulse: 66           ---------------------------------------------PHYSICAL EXAM --------------------------------------------    Vitals:    06/30/24 1050 06/30/24 1051   BP:  (!) 149/77   Pulse:  66   Resp:  18   Temp:  97.6 F (36.4 C)   SpO2:  97%   Weight: 97.5 kg (215 lb)      Oxygen Saturation Interpretation: Normal     Physical Exam  Vitals and nursing note reviewed.   Constitutional:       General: He is not in acute distress.     Appearance: Normal appearance.   HENT:      Head: Normocephalic and atraumatic.      Jaw: There is normal jaw occlusion.      Right Ear: Hearing, tympanic membrane, ear canal and external ear normal.      Left Ear: Hearing, tympanic membrane, ear canal and external ear normal.      Nose: Congestion and rhinorrhea present.      Right Sinus: Maxillary sinus tenderness present.      Left Sinus: Maxillary sinus tenderness present.      Mouth/Throat:      Mouth: Mucous membranes are moist. No angioedema.      Pharynx: Oropharynx is clear.  Uvula midline. Postnasal drip present. No pharyngeal swelling, oropharyngeal exudate, posterior oropharyngeal erythema or uvula swelling.      Tonsils: No tonsillar exudate.      Comments: Oropharynx is patent.     Eyes:      Extraocular Movements: Extraocular movements intact.      Conjunctiva/sclera: Conjunctivae normal.      Pupils: Pupils are equal, round, and reactive to light.   Cardiovascular:      Rate and Rhythm: Normal rate and regular rhythm.      Pulses: Normal pulses.      Heart sounds: Normal heart sounds.   Pulmonary:      Effort: Pulmonary effort is normal.      Breath sounds: Wheezing present.   Musculoskeletal:         General: Normal range of motion.      Cervical back: Full passive range of motion without pain, normal range of motion and neck supple.   Lymphadenopathy:       Cervical: No cervical adenopathy.   Skin:     General: Skin is warm and dry.      Findings: No rash.   Neurological:      General: No focal deficit present.      Mental Status: He is alert and oriented to person, place, and time.   Psychiatric:         Behavior: Behavior is cooperative.         -------------------------------------------------- RESULTS -------------------------------------------------  No results found for this visit on 06/30/24.  No orders to display       Labs Reviewed - No data to display    When ordered only abnormal lab results are displayed. All other labs were within normal range or not returned as of this dictation.    Interpretation per the Radiologist below, if available at the time of this note:    No orders to display     No results found.    No results found.     -------------------------------------------------PROCEDURES--------------------------------------------  Unless otherwise noted below, none      Procedures      ---EMERGENCY DEPARTMENT COURSE and DIFFERENTIAL DIAGNOSIS/MDM---  (CC/HPI Summary, DDx, ED Course, and Reassessment:) (Disposition Considerations (include 1 Tests not done, Admit vs D/C, Shared Decision Making, Pt Expectation of Test or Tx., Consults, Social Determinants, Chronic Conditiions, Records reviewed)    MDM  Number of Diagnoses or Management Options  URI with cough and congestion  Diagnosis management comments: This is a 79 year old male that presents to urgent care complaining of initially sinus infection symptoms but also states that over the past day he has been having more coughing and wheezing.  He does have inhalers that he uses and he has had to use those more often.  He denies any lightheadedness.  No chest pain.  No shortness of breath at this time no abdominal pain nausea vomiting diarrhea or urinary symptoms.  No acute distress.  Afebrile.  O2 sat within normal limits.  He does have some wheezing on exam.  Recommend continuing his inhalers also  placed on a steroid regimen.  Continue over-the-counter cough and cold medications.  Add Flonase over-the-counter for sinus symptoms.  Placed on respiratory erotic Zithromax  recommend close follow-up with primary care provider and if symptoms worsen go to ER.         DISCHARGE MEDICATIONS:  Discharge Medication List as of 06/30/2024 11:26 AM        START taking these medications    Details  azithromycin  (ZITHROMAX  Z-PAK) 250 MG tablet Take 2 tabs on day one, followed by 1 tablet daily for 4 days., Disp-1 packet, R-0Normal             DISCONTINUED MEDICATIONS:  Discharge Medication List as of 06/30/2024 11:26 AM          PATIENT REFERRED TO:  Aimee Nile FERNS, MD  [address] [place]  [place] [id number]  [phone]    Schedule an appointment as soon as possible for a visit         --------------------------------- ADDITIONAL PROVIDER NOTES ---------------------------------  I have spoken with the patient and discussed todays results, in addition to providing specific details for the plan of care and counseling regarding the diagnosis and prognosis.  Their questions are answered at this time and they are agreeable with the plan.   This patient is stable for discharge.  I have shared the specific conditions for return, as well as the importance of follow-up.      I discussed the differential, results and discharge plan with the patient and/or family/friend/caregiver if present.  I emphasized the importance of follow-up with the physician I referred them to in the timeframe recommended.  I explained reasons for the patient to return to the Emergency Department. Additional verbal discharge instructions were also given and discussed with the patient to supplement those generated by the EMR. We also discussed medications that were prescribed (if any) including common side effects and interactions. The patient was advised to abstain from driving, operating heavy machinery or making significant decisions while taking  medications such as opiates and muscle relaxers that may impair this. All questions were addressed.  They understand return precautions and discharge instructions. The patient and/or family/friend/caregiver expressed understanding. Vitals were stable and they were in no distress at discharge. Findings were discussed with the patient and reasons to immediately return to the ED were articulated to them.      I used an evidence-based tool along with my training and experience to weigh the risk of discharge against the risks of further testing, imaging, or hospitalization. At this time, I estimate the risks of additional testing, imaging, or hospitalization to be equal to or greater than the risk of discharge.  I discussed my risk assessment with the patient and the patient consents to the risk of discharge as well as the risk of uncertainty in estimating outcomes. At this time, the risk of acute decompensation with death before the patient can return for re-evaluation is most likely lower than the risk of death attributable to being in the hospital.      Plan of Care/Counseling:  Oneil Fend PA-C reviewed today's visit with the patient in addition to providing specific details for the plan of care and counseling regarding the diagnosis and prognosis.  Questions are answered at this time and are agreeable with the plan.       * NOTE: (Please note that portions of this note were completed with a voice recognition program.  Efforts were made to edit the dictations but occasionally words are mis-transcribed.)    --------------------------------- IMPRESSION AND DISPOSITION ---------------------------------    IMPRESSION  1. URI with cough and congestion        Disposition: Discharge to home  Patient condition is good    I am the Primary Clinician of Record.     Oneil DELENA Fend, PA-C (electronically signed) on 06/30/2024 at 11:31 AM         Fend Oneil DELENA, PA-C  06/30/24 1132
